# Patient Record
Sex: Female | Born: 1975 | Race: Black or African American | Hispanic: No | Marital: Single | Smoking: Never smoker
Health system: Southern US, Community
[De-identification: ages and names within clinical notes are randomized; demographics above are authoritative.]

## PROBLEM LIST (undated history)

## (undated) DIAGNOSIS — Z90710 Acquired absence of both cervix and uterus: Secondary | ICD-10-CM

## (undated) DIAGNOSIS — A048 Other specified bacterial intestinal infections: Secondary | ICD-10-CM

## (undated) DIAGNOSIS — G43909 Migraine, unspecified, not intractable, without status migrainosus: Secondary | ICD-10-CM

## (undated) DIAGNOSIS — N8300 Follicular cyst of ovary, unspecified side: Secondary | ICD-10-CM

## (undated) DIAGNOSIS — Z008 Encounter for other general examination: Secondary | ICD-10-CM

## (undated) DIAGNOSIS — J45909 Unspecified asthma, uncomplicated: Secondary | ICD-10-CM

## (undated) DIAGNOSIS — M1990 Unspecified osteoarthritis, unspecified site: Secondary | ICD-10-CM

## (undated) DIAGNOSIS — K5792 Diverticulitis of intestine, part unspecified, without perforation or abscess without bleeding: Secondary | ICD-10-CM

## (undated) DIAGNOSIS — L659 Nonscarring hair loss, unspecified: Secondary | ICD-10-CM

## (undated) HISTORY — DX: Encounter for other general examination: Z00.8

## (undated) HISTORY — DX: Acquired absence of both cervix and uterus: Z90.710

## (undated) HISTORY — DX: Unspecified asthma, uncomplicated: J45.909

## (undated) HISTORY — PX: FOOT SURGERY: SHX648

## (undated) HISTORY — PX: HERNIA REPAIR: SHX51

## (undated) HISTORY — DX: Migraine, unspecified, not intractable, without status migrainosus: G43.909

## (undated) HISTORY — PX: ECTOPIC PREGNANCY SURGERY: SHX613

## (undated) HISTORY — PX: ABDOMINAL HYSTERECTOMY: SHX81

## (undated) HISTORY — PX: OOPHORECTOMY: SHX86

## (undated) HISTORY — DX: Unspecified osteoarthritis, unspecified site: M19.90

---

## 2001-07-23 HISTORY — PX: TOTAL VAGINAL HYSTERECTOMY: SHX2548

## 2001-07-23 HISTORY — PX: PARTIAL HYSTERECTOMY: SHX80

## 2006-03-13 ENCOUNTER — Ambulatory Visit: Payer: Self-pay | Admitting: Family Medicine

## 2006-04-08 ENCOUNTER — Ambulatory Visit: Payer: Self-pay | Admitting: Family Medicine

## 2006-04-30 DIAGNOSIS — D509 Iron deficiency anemia, unspecified: Secondary | ICD-10-CM | POA: Insufficient documentation

## 2006-04-30 DIAGNOSIS — G9332 Myalgic encephalomyelitis/chronic fatigue syndrome: Secondary | ICD-10-CM | POA: Insufficient documentation

## 2006-04-30 DIAGNOSIS — R5382 Chronic fatigue, unspecified: Secondary | ICD-10-CM

## 2006-05-03 ENCOUNTER — Ambulatory Visit: Payer: Self-pay | Admitting: Physical Medicine & Rehabilitation

## 2006-05-06 ENCOUNTER — Ambulatory Visit: Payer: Self-pay | Admitting: Family Medicine

## 2006-05-06 LAB — CONVERTED CEMR LAB: Blood Glucose, Fasting: 102 mg/dL

## 2006-06-20 ENCOUNTER — Ambulatory Visit: Payer: Self-pay | Admitting: Family Medicine

## 2006-06-20 ENCOUNTER — Encounter: Payer: Self-pay | Admitting: Family Medicine

## 2006-06-20 DIAGNOSIS — J45909 Unspecified asthma, uncomplicated: Secondary | ICD-10-CM | POA: Insufficient documentation

## 2006-06-20 DIAGNOSIS — L259 Unspecified contact dermatitis, unspecified cause: Secondary | ICD-10-CM | POA: Insufficient documentation

## 2006-06-21 ENCOUNTER — Ambulatory Visit: Payer: Self-pay | Admitting: Physical Medicine & Rehabilitation

## 2006-06-24 ENCOUNTER — Telehealth: Payer: Self-pay | Admitting: Family Medicine

## 2006-09-16 ENCOUNTER — Ambulatory Visit: Payer: Self-pay | Admitting: Family Medicine

## 2006-09-16 LAB — CONVERTED CEMR LAB
Glucose, Urine, Semiquant: NEGATIVE
Ketones, urine, test strip: NEGATIVE
Protein, U semiquant: NEGATIVE

## 2006-09-23 ENCOUNTER — Telehealth: Payer: Self-pay | Admitting: Family Medicine

## 2006-09-23 ENCOUNTER — Ambulatory Visit: Payer: Self-pay | Admitting: Family Medicine

## 2006-09-23 DIAGNOSIS — R319 Hematuria, unspecified: Secondary | ICD-10-CM | POA: Insufficient documentation

## 2006-09-23 LAB — CONVERTED CEMR LAB
Bilirubin Urine: NEGATIVE
Ketones, urine, test strip: NEGATIVE
WBC Urine, dipstick: NEGATIVE
pH: 7

## 2006-09-24 ENCOUNTER — Encounter: Payer: Self-pay | Admitting: Family Medicine

## 2006-09-24 LAB — CONVERTED CEMR LAB
RBC / HPF: NONE SEEN (ref <3)
WBC, UA: NONE SEEN cells/hpf (ref <3)

## 2006-09-26 ENCOUNTER — Telehealth: Payer: Self-pay | Admitting: Family Medicine

## 2006-10-18 ENCOUNTER — Ambulatory Visit: Payer: Self-pay | Admitting: Family Medicine

## 2006-10-18 DIAGNOSIS — R109 Unspecified abdominal pain: Secondary | ICD-10-CM | POA: Insufficient documentation

## 2006-10-18 DIAGNOSIS — R928 Other abnormal and inconclusive findings on diagnostic imaging of breast: Secondary | ICD-10-CM | POA: Insufficient documentation

## 2006-10-29 ENCOUNTER — Encounter: Payer: Self-pay | Admitting: Family Medicine

## 2006-11-04 ENCOUNTER — Encounter: Payer: Self-pay | Admitting: Family Medicine

## 2007-01-15 ENCOUNTER — Encounter: Payer: Self-pay | Admitting: Family Medicine

## 2007-02-24 ENCOUNTER — Ambulatory Visit: Payer: Self-pay | Admitting: Family Medicine

## 2007-02-24 ENCOUNTER — Other Ambulatory Visit: Admission: RE | Admit: 2007-02-24 | Discharge: 2007-02-24 | Payer: Self-pay | Admitting: Family Medicine

## 2007-02-24 ENCOUNTER — Encounter: Payer: Self-pay | Admitting: Family Medicine

## 2007-02-28 ENCOUNTER — Encounter: Payer: Self-pay | Admitting: Family Medicine

## 2007-02-28 ENCOUNTER — Telehealth: Payer: Self-pay | Admitting: Family Medicine

## 2007-02-28 LAB — CONVERTED CEMR LAB
ALT: 17 units/L (ref 0–35)
AST: 14 units/L (ref 0–37)
Albumin: 4.5 g/dL (ref 3.5–5.2)
BUN: 15 mg/dL (ref 6–23)
Calcium: 9.5 mg/dL (ref 8.4–10.5)
Chloride: 107 meq/L (ref 96–112)
Cholesterol, target level: 200 mg/dL
HDL goal, serum: 40 mg/dL
HDL: 51 mg/dL (ref >39)
Hemoglobin: 13 g/dL (ref 12.0–15.0)
LDL Cholesterol: 145 mg/dL — ABNORMAL HIGH (ref 0–99)
LDL Goal: 160 mg/dL
Potassium: 4.6 meq/L (ref 3.5–5.3)
RDW: 16.8 % — ABNORMAL HIGH (ref 11.5–14.0)
Sodium: 144 meq/L (ref 135–145)
TSH: 2.18 microintl units/mL (ref 0.350–5.50)
Total Protein: 7.9 g/dL (ref 6.0–8.3)

## 2007-05-02 ENCOUNTER — Telehealth: Payer: Self-pay | Admitting: Family Medicine

## 2007-05-02 ENCOUNTER — Telehealth (INDEPENDENT_AMBULATORY_CARE_PROVIDER_SITE_OTHER): Payer: Self-pay | Admitting: *Deleted

## 2007-05-12 LAB — HM MAMMOGRAPHY

## 2007-05-13 ENCOUNTER — Ambulatory Visit: Payer: Self-pay | Admitting: Family Medicine

## 2007-05-13 DIAGNOSIS — G43009 Migraine without aura, not intractable, without status migrainosus: Secondary | ICD-10-CM | POA: Insufficient documentation

## 2007-06-16 ENCOUNTER — Ambulatory Visit: Payer: Self-pay | Admitting: Family Medicine

## 2008-03-08 ENCOUNTER — Telehealth: Payer: Self-pay | Admitting: Family Medicine

## 2008-03-11 ENCOUNTER — Ambulatory Visit: Payer: Self-pay | Admitting: Family Medicine

## 2008-03-11 DIAGNOSIS — R221 Localized swelling, mass and lump, neck: Secondary | ICD-10-CM

## 2008-03-11 DIAGNOSIS — R220 Localized swelling, mass and lump, head: Secondary | ICD-10-CM | POA: Insufficient documentation

## 2008-03-12 LAB — CONVERTED CEMR LAB
Basophils Absolute: 0 10*3/uL (ref 0.0–0.1)
Lymphocytes Relative: 52 % — ABNORMAL HIGH (ref 12–46)
Lymphs Abs: 2.8 10*3/uL (ref 0.7–4.0)
Neutrophils Relative %: 40 % — ABNORMAL LOW (ref 43–77)
Platelets: 332 10*3/uL (ref 150–400)
RDW: 16.5 % — ABNORMAL HIGH (ref 11.5–15.5)
Sed Rate: 14 mm/hr (ref 0–22)
TSH: 1.154 microintl units/mL (ref 0.350–4.50)
WBC: 5.5 10*3/uL (ref 4.0–10.5)

## 2008-07-23 HISTORY — PX: OTHER SURGICAL HISTORY: SHX169

## 2008-11-04 ENCOUNTER — Ambulatory Visit: Payer: Self-pay | Admitting: Family Medicine

## 2009-01-07 ENCOUNTER — Ambulatory Visit: Payer: Self-pay | Admitting: Family Medicine

## 2009-01-07 ENCOUNTER — Ambulatory Visit (HOSPITAL_BASED_OUTPATIENT_CLINIC_OR_DEPARTMENT_OTHER): Admission: RE | Admit: 2009-01-07 | Discharge: 2009-01-07 | Payer: Self-pay | Admitting: Family Medicine

## 2009-01-07 ENCOUNTER — Ambulatory Visit: Payer: Self-pay | Admitting: Diagnostic Radiology

## 2009-01-07 DIAGNOSIS — R10814 Left lower quadrant abdominal tenderness: Secondary | ICD-10-CM | POA: Insufficient documentation

## 2009-01-07 LAB — CONVERTED CEMR LAB
AST: 17 units/L (ref 0–37)
Albumin: 4 g/dL (ref 3.5–5.2)
Alkaline Phosphatase: 56 units/L (ref 39–117)
BUN: 6 mg/dL (ref 6–23)
Glucose, Bld: 95 mg/dL (ref 70–99)
Glucose, Urine, Semiquant: NEGATIVE
Nitrite: NEGATIVE
Potassium: 3.8 meq/L (ref 3.5–5.3)
RBC / HPF: NONE SEEN (ref <3)
Sodium: 140 meq/L (ref 135–145)
Total Bilirubin: 0.6 mg/dL (ref 0.3–1.2)
Urobilinogen, UA: 0.2

## 2009-01-08 ENCOUNTER — Encounter: Payer: Self-pay | Admitting: Family Medicine

## 2009-01-10 ENCOUNTER — Encounter: Payer: Self-pay | Admitting: Family Medicine

## 2009-01-10 LAB — CONVERTED CEMR LAB
Amylase: 36 units/L (ref 0–105)
Basophils Absolute: 0 10*3/uL (ref 0.0–0.1)
Basophils Relative: 0 % (ref 0–1)
Eosinophils Absolute: 0.1 10*3/uL (ref 0.0–0.7)
Eosinophils Relative: 1 % (ref 0–5)
HCT: 38.3 % (ref 36.0–46.0)
Hemoglobin: 12.6 g/dL (ref 12.0–15.0)
Lipase: 76 units/L — ABNORMAL HIGH (ref 0–75)
Lymphocytes Relative: 42 % (ref 12–46)
Lymphs Abs: 2.8 10*3/uL (ref 0.7–4.0)
MCHC: 32.9 g/dL (ref 30.0–36.0)
MCV: 74.2 fL — ABNORMAL LOW (ref 78.0–100.0)
Monocytes Absolute: 0.6 10*3/uL (ref 0.1–1.0)
Monocytes Relative: 9 % (ref 3–12)
Neutro Abs: 3.2 10*3/uL (ref 1.7–7.7)
Neutrophils Relative %: 48 % (ref 43–77)
Platelets: 271 10*3/uL (ref 150–400)
RBC: 5.16 M/uL — ABNORMAL HIGH (ref 3.87–5.11)
RDW: 17.2 % — ABNORMAL HIGH (ref 11.5–15.5)
Saturation Ratios: 16 % — ABNORMAL LOW (ref 20–55)
WBC: 6.7 10*3/uL (ref 4.0–10.5)

## 2009-02-07 ENCOUNTER — Telehealth: Payer: Self-pay | Admitting: Family Medicine

## 2009-02-07 DIAGNOSIS — N83209 Unspecified ovarian cyst, unspecified side: Secondary | ICD-10-CM | POA: Insufficient documentation

## 2009-02-08 ENCOUNTER — Encounter: Admission: RE | Admit: 2009-02-08 | Discharge: 2009-02-08 | Payer: Self-pay | Admitting: Family Medicine

## 2009-02-24 ENCOUNTER — Ambulatory Visit: Payer: Self-pay | Admitting: Family Medicine

## 2009-02-24 DIAGNOSIS — M546 Pain in thoracic spine: Secondary | ICD-10-CM | POA: Insufficient documentation

## 2009-02-24 LAB — CONVERTED CEMR LAB
Bilirubin Urine: NEGATIVE
Glucose, Urine, Semiquant: NEGATIVE
Nitrite: NEGATIVE
WBC Urine, dipstick: NEGATIVE

## 2009-02-25 ENCOUNTER — Encounter: Payer: Self-pay | Admitting: Family Medicine

## 2009-08-12 ENCOUNTER — Ambulatory Visit: Payer: Self-pay | Admitting: Family Medicine

## 2009-08-12 DIAGNOSIS — J069 Acute upper respiratory infection, unspecified: Secondary | ICD-10-CM | POA: Insufficient documentation

## 2009-08-15 ENCOUNTER — Telehealth: Payer: Self-pay | Admitting: Family Medicine

## 2010-08-22 NOTE — Progress Notes (Signed)
Summary: Not feeling any better  Phone Note Call from Patient Call back at Home Phone [phone]   Caller: Patient Call For: Nani Gasser MD Summary of Call: pt calls and states that the cough is not any better- its worse and chest congestion worse and was told to call if no better Initial call taken by: Kathlene November,  August 15, 2009 2:36 PM  Follow-up for Phone Call        [facility] will call in ABX Follow-up by: Nani Gasser MD,  August 15, 2009 3:54 PM    New/Updated Medications: ZITHROMAX Z-PAK 250 MG TABS (AZITHROMYCIN) Take as directed. Prescriptions: ZITHROMAX Z-PAK 250 MG TABS (AZITHROMYCIN) Take as directed.  #1 pack x 0   Entered and Authorized by:   Nani Gasser MD   Signed by:   Nani Gasser MD on 08/15/2009   Method used:   Electronically to        [facility] # [phone]* (retail)       [address] [place], [place]  [address]       Ph: [phone]       Fax: [phone]   RxID:   [id number]

## 2010-08-22 NOTE — Assessment & Plan Note (Signed)
Summary: Meredith Barnes, HSV results.    Vital Signs:  Patient profile:   35 year old female Height:      66 inches Weight:      235 pounds BMI:     38.07 Pulse rate:   78 / minute BP sitting:   114 / 77  (left arm) Cuff size:   large  Vitals Entered By: Kathlene November (August 12, 2009 1:47 PM) CC: would like second opinion on herpes diagnosis that she was given by another MD   Primary Care Provider:  Linford Arnold  CC:  would like second opinion on herpes diagnosis that she was given by another MD.  History of Present Illness: would like second opinion on herpes diagnosis that she was given by another MD. Had a pap and CPE about 2-3 weeks ago. Had a d/c at that time.  Was dx with trichomonas. Then had STD testing. Labwork showed herpes, not sure if acute or not.  She brought in a copy with her and would like me to review it. Had recheck for her trichomonas yesterday to make sure if cleared.     Cough for 2 weeks. Some better but still worse at night. No fever or ST.  Tooks some delsym.  no hx of asthma.  No GERD sxs. the cold and heat seem to aggrevate her sxs.    Current Medications (verified): 1)  None  Allergies (verified): No Known Drug Allergies  Comments:  Nurse/Medical Assistant: The patient's medications and allergies were reviewed with the patient and were updated in the Medication and Allergy Lists. Kathlene November (August 12, 2009 1:48 PM)  Physical Exam  General:  Well-developed,well-nourished,in no acute distress; alert,appropriate and cooperative throughout examination Head:  Normocephalic and atraumatic without obvious abnormalities. No apparent alopecia or balding. Eyes:  No corneal or conjunctival inflammation noted. EOMI. Perrla. Ears:  External ear exam shows no significant lesions or deformities.  Otoscopic examination reveals clear canals, tympanic membranes are intact bilaterally without bulging, retraction, inflammation or discharge. Hearing is grossly normal  bilaterally. Nose:  External nasal examination shows no deformity or inflammation. Nasal mucosa are pink and moist without lesions or exudates. Mouth:  Oral mucosa and oropharynx without lesions or exudates.  Teeth in good repair. Lungs:  Normal respiratory effort, chest expands symmetrically. Lungs are clear to auscultation, no crackles or wheezes. Heart:  Normal rate and regular rhythm. S1 and S2 normal without gallop, murmur, click, rub or other extra sounds. Skin:  no rashes.   Cervical Nodes:  No lymphadenopathy noted Psych:  Cognition and judgment appear intact. Alert and cooperative with normal attention span and concentration. No apparent delusions, illusions, hallucinations   Impression & Recommendations:  Problem # 1:  URI (ICD-465.9) Assessment New  The following medications were removed from the medication list:    Ibuprofen 600 Mg Tabs (Ibuprofen) .Marland Kitchen... Take 1 tablet by mouth three times a day as needed  Instructed on symptomatic treatment. Call if symptoms persist or worsen.   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 She was + for IgD antibodies. Expalined that this jut means that she has been exposed. She has not seen any active lesions. We spent about 20 minues face to face discussing the dx.

## 2010-11-06 ENCOUNTER — Encounter: Payer: Self-pay | Admitting: Family Medicine

## 2010-11-06 ENCOUNTER — Telehealth: Payer: Self-pay | Admitting: *Deleted

## 2010-11-06 ENCOUNTER — Ambulatory Visit (INDEPENDENT_AMBULATORY_CARE_PROVIDER_SITE_OTHER): Payer: PRIVATE HEALTH INSURANCE | Admitting: Family Medicine

## 2010-11-06 VITALS — BP 124/83 | HR 93 | Ht 67.0 in | Wt 224.0 lb

## 2010-11-06 DIAGNOSIS — G43509 Persistent migraine aura without cerebral infarction, not intractable, without status migrainosus: Secondary | ICD-10-CM

## 2010-11-06 DIAGNOSIS — G43009 Migraine without aura, not intractable, without status migrainosus: Secondary | ICD-10-CM

## 2010-11-06 MED ORDER — TOPIRAMATE 25 MG PO TABS: ORAL_TABLET | ORAL | Status: DC

## 2010-11-06 MED ORDER — PREDNISONE (PAK) 10 MG PO TABS: ORAL_TABLET | ORAL | Status: DC

## 2010-11-06 NOTE — Assessment & Plan Note (Signed)
At this point I really think she is having rebound headaches. I think this is why they are persistent the last month she is continually taking medication on a daily basis. Even though she has tried steroids a couple weeks ago I would like to retry this and have her stop all additional medications including the Fioricet, Advil, hydrocodone, muscle relaxers. She reports them as relaxers don't help her anyway. I also would like to go ahead and start her on prophylaxis so we discussed different options. She has taken amitriptyline in the past and felt it didn't work well. We will start with Topamax. She is unsure where she's taken this in the past or not. Followup one month. If headache is not breaking over the next 10 days she's to call the office. In the meantime we will schedule her for an MRI since this migraine is very unusual in that it has lasted this long. Typically at the most her migraines last 3 days.

## 2010-11-06 NOTE — Progress Notes (Signed)
  Subjective:    Patient ID: Meredith Barnes, female    DOB: October 20, 1975, 35 y.o.   MRN: [id number]  Migraine  This is a chronic problem. The current episode started 1 to 4 weeks ago. The problem occurs daily. The problem has been unchanged. The pain is located in the left unilateral region. The pain radiates to the left neck and left shoulder. The pain quality is similar to prior headaches. The quality of the pain is described as throbbing. The pain is at a severity of 8/10. Associated symptoms include neck pain, phonophobia and photophobia. Pertinent negatives include no coughing, loss of balance, nausea, numbness, sinus pressure, sore throat or visual change. The symptoms are aggravated by noise and bright light. She has tried darkened room, NSAIDs and ketorolac injections for the symptoms. Her past medical history is significant for migraine headaches and obesity.    Usaing a lot of Advil migraine. Did go to the ED about 3 weeks ago and scanned her head. Given steroid pack and didn't help. Went again yesterday today and give toradol  And decan shot and given some med for nausea. Given a rx for Fiorcet.  HA worse worse when she woke up in the middle of the night..  Pain is moslty on her right side and getting tightening in her shoulder and esp on the left side of neck. Went back to ED this morning ([facility]) and given more rx that she hasn't filled yet. One is a muscle relaxer which she feels does not really help her and one is for a narcotic hydrocodone for pain relief. Pain is 8/10 right now. No injections this AM.  No nausea. No changes in her diet or sleep patterns or mood.  Now her upper back as well.  Right side is not bothering her. Tried a heating pad- no relief.  Head is throbbing.  No URI sxs or sinus pressure.    Review of Systems  HENT: Positive for neck pain. Negative for sore throat and sinus pressure.   Eyes: Positive for photophobia.  Respiratory: Negative for cough.     Gastrointestinal: Negative for nausea.  Neurological: Negative for numbness and loss of balance.       Objective:   Physical Exam  Constitutional: She is oriented to person, place, and time. She appears well-developed and well-nourished.  HENT:  Head: Normocephalic and atraumatic.  Right Ear: External ear normal.  Left Ear: External ear normal.  Nose: Nose normal.  Mouth/Throat: Oropharynx is clear and moist.       She is squinting.  Eyes: Conjunctivae and EOM are normal. Pupils are equal, round, and reactive to light.  Neck: Normal range of motion. Neck supple. No thyromegaly present.  Cardiovascular: Normal rate, regular rhythm and normal heart sounds.   Pulmonary/Chest: Effort normal and breath sounds normal.  Lymphadenopathy:    She has no cervical adenopathy.  Neurological: She is alert and oriented to person, place, and time. No cranial nerve deficit.       Normal gait.  Skin: Skin is warm and dry.  Psychiatric: She has a normal mood and affect.          Assessment & Plan:

## 2010-11-06 NOTE — Telephone Encounter (Signed)
[facility] Triage Call Report Triage Record Num: [id number] Operator: Migdalia Dk Patient Name: Mattia [place]issau Call Date & Time: 11/06/2010 3:58:50AM Patient Phone: [phone] PCP: Nani Gasser Patient Gender: Female PCP Fax : [phone] Patient DOB: 12-07-1975 [facility] Name[facility] Reason for Call: Pt. states seen in ER due to headache on 10/24/2010, seen again on 11/04/2010 and again this a.m. "I went back yesterday morning because I was hurting again and they gave me shots and a prescription for headaches (Fiorcet), I took those and they didn't help , so I went back and they gave me two more prescriptions and sent me back home." States now has scripts for Flexeril and Percocet on hand but has not had them filled. Pain in left side of head to neck and down to shoulder, "it tightens up in my arm and goes numb down to my hand." Pt. advised to go back to ER or to fill scripts that she received to see if they will help with pain. States, "If i go back to the ER they are just going to send me home again and they said they won't have anyone there to do an MRI till 9 this morning, they told me to call my doctor and see if he couldn't set that up." Pt. states she will call office this a.m. for follow up.

## 2010-11-08 ENCOUNTER — Telehealth: Payer: Self-pay | Admitting: *Deleted

## 2010-11-08 MED ORDER — HYDROCODONE-ACETAMINOPHEN 5-325 MG PO TABS: 1.0000 | ORAL_TABLET | Freq: Four times a day (QID) | ORAL | Status: DC | PRN

## 2010-11-08 NOTE — Telephone Encounter (Signed)
Pt states she does need Rx for hydrocodone.

## 2010-11-08 NOTE — Telephone Encounter (Signed)
Addended by: Nani Gasser on: 11/08/2010 11:58 AM   Modules accepted: Orders

## 2010-11-08 NOTE — Telephone Encounter (Signed)
Pt. Called a little upset and states that she had left a message yesterday on the triage line and on the nurse's line and did not get a phone call back. She is calling because she is in pain and has a question about her meds and when her MRI will be completed. PLEASE call her and update her on the status of all this. Thanks, [employer]

## 2010-11-08 NOTE — Telephone Encounter (Signed)
She can use the hydrocodone then. If needs a new rx then let me know. We are working on the MRI but it is not emergent. I just say her yesterday.

## 2010-11-08 NOTE — Telephone Encounter (Signed)
Pt wants rx for hydrocodone. 

## 2010-11-08 NOTE — Telephone Encounter (Signed)
[facility] Triage Call Report Triage Record Num: [id number] Operator: Martie Lee Long Patient Name: Meredith Barnes Call Date & Time: 11/07/2010 5:34:50PM Patient Phone: [phone] PCP: Nani Gasser Patient Gender: Female PCP Fax : [phone] Patient DOB: 1975-09-08 Practice Name[facility] Reason for Call: [name]/Patient calling about lower back pain and tightness to her arms and neck(all on the left side). Pt has been evaluated for this on 11/06/10. Pt was seen in office on 11/06/10 and started on Topromax for her headaches. Onset 11/06/10. Pt wants to know what to take. All emergent sx r/o per Back Symptoms Protocol. Homecare advice given.

## 2010-11-16 ENCOUNTER — Ambulatory Visit
Admission: RE | Admit: 2010-11-16 | Discharge: 2010-11-16 | Disposition: A | Discharge status: Home / Self Care | Payer: PRIVATE HEALTH INSURANCE | Source: Ambulatory Visit | Attending: Family Medicine | Admitting: Family Medicine

## 2010-11-16 DIAGNOSIS — G43509 Persistent migraine aura without cerebral infarction, not intractable, without status migrainosus: Secondary | ICD-10-CM

## 2010-11-20 ENCOUNTER — Telehealth: Payer: Self-pay | Admitting: Family Medicine

## 2010-11-20 NOTE — Telephone Encounter (Signed)
A few small spots on the white matter that is consistant with migraines. No sign of tumor, etc.  Is she still having HA?

## 2010-11-21 NOTE — Telephone Encounter (Signed)
Left message on pt's vm with results

## 2010-12-03 ENCOUNTER — Encounter: Payer: Self-pay | Admitting: Family Medicine

## 2010-12-07 ENCOUNTER — Ambulatory Visit: Payer: PRIVATE HEALTH INSURANCE | Admitting: Family Medicine

## 2011-06-29 ENCOUNTER — Encounter (HOSPITAL_BASED_OUTPATIENT_CLINIC_OR_DEPARTMENT_OTHER): Payer: Self-pay | Admitting: Emergency Medicine

## 2011-06-29 ENCOUNTER — Emergency Department (HOSPITAL_BASED_OUTPATIENT_CLINIC_OR_DEPARTMENT_OTHER)
Admission: EM | Admit: 2011-06-29 | Discharge: 2011-06-29 | Disposition: A | Discharge status: Home / Self Care | Payer: Medicaid Other | Attending: Emergency Medicine | Admitting: Emergency Medicine

## 2011-06-29 DIAGNOSIS — R059 Cough, unspecified: Secondary | ICD-10-CM | POA: Insufficient documentation

## 2011-06-29 DIAGNOSIS — J209 Acute bronchitis, unspecified: Secondary | ICD-10-CM | POA: Insufficient documentation

## 2011-06-29 DIAGNOSIS — R05 Cough: Secondary | ICD-10-CM | POA: Insufficient documentation

## 2011-06-29 MED ORDER — AZITHROMYCIN 250 MG PO TABS: ORAL_TABLET | ORAL | Status: DC

## 2011-06-29 MED ORDER — HYDROCOD POLST-CHLORPHEN POLST 10-8 MG/5ML PO LQCR: 5.0000 mL | Freq: Two times a day (BID) | ORAL | Status: DC | PRN

## 2011-06-29 NOTE — ED Notes (Signed)
Care assumed

## 2011-06-29 NOTE — ED Notes (Signed)
Pt states chest pain is tactile above sternum, sore to touch, denies SOB or cardiac symptoms. Pt states pain to muscles of chest "every time I cough". Skin tone WNL. No resp distress.

## 2011-06-29 NOTE — ED Provider Notes (Addendum)
History     CSN: [id number] Arrival date & time: 06/29/2011  9:24 AM   None     Chief Complaint  Patient presents with  . Cough    (Consider location/radiation/quality/duration/timing/severity/associated sxs/prior treatment) HPI Comments:  Patient is a 35 year old woman who has had a bad cough for about a week. The cough keeps her up at night. She coughs up a yellow sputum. There's been no fever. She therefore seeks evaluation. She is a nonsmoker.  Patient is a 35 y.o. female presenting with cough.  Cough The current episode started more than 2 days ago. The problem occurs constantly. The cough is productive of sputum. There has been no fever. She has tried nothing for the symptoms. She is not a smoker.    Past Medical History  Diagnosis Date  . Ectopic pregnancy   . Hx of hysterectomy partial for cervicl CA 2003  . Sigmoidoscopy exam partial sigmoid colectomy 07-23-08    Past Surgical History  Procedure Date  . Partial hysterectomy 2003    Cervical Cancer  . Ectopic pregnancy surgery   . Partial sigmoid colectomy 07-23-08 07-23-08    History reviewed. No pertinent family history.  History  Substance Use Topics  . Smoking status: Never Smoker   . Smokeless tobacco: Not on file  . Alcohol Use: No    OB History    Grav Para Term Preterm Abortions TAB SAB Ect Mult Living                  Review of Systems  Constitutional: Negative.  Negative for fever.  HENT: Negative.   Eyes: Negative.   Respiratory: Positive for cough.   Cardiovascular: Negative.   Gastrointestinal: Negative.   Genitourinary: Negative.   Musculoskeletal: Negative.   Skin: Negative.   Neurological: Negative.   Psychiatric/Behavioral: Negative.     Allergies  Review of patient's allergies indicates no known allergies.  Home Medications   Current Outpatient Rx  Name Route Sig Dispense Refill  . AZITHROMYCIN 250 MG PO TABS Oral Take 2 tablets by mouth on day 1, followed by 1 tablet by  mouth daily for 4 days. Take as directed.     . AZITHROMYCIN 250 MG PO TABS  Take 2 tablets today, then 1 every day until finished. 6 tablet 0  . HYDROCOD POLST-CHLORPHEN POLST 10-8 MG/5ML PO LQCR Oral Take 5 mLs by mouth every 12 (twelve) hours as needed. 60 mL 0  . HYDROCODONE-ACETAMINOPHEN 5-325 MG PO TABS Oral Take 1 tablet by mouth every 6 (six) hours as needed. 12 tablet 0  . PREDNISONE (PAK) 10 MG PO TABS  8 tabs by mouth Day 1, 6 tabs by mouth Day 2, 4 tabs by mouth Day 3, 2 tabs by mouth Day 4, 1 tab by mouth day 5. 21 tablet 0  . TOPIRAMATE 25 MG PO TABS  One tab at bedtime for one week.  Then increase to bid. 60 tablet 0    BP 119/84  Pulse 79  Temp(Src) 98.3 F (36.8 C) (Oral)  Resp 18  Ht 5\' 7"  (1.702 m)  Wt 234 lb (106.142 kg)  BMI 36.65 kg/m2  SpO2 100%  Physical Exam  Constitutional: She is oriented to person, place, and time.       Patient is an obese young woman with a deep hacking  cough.  HENT:  Head: Normocephalic and atraumatic.  Right Ear: External ear normal.  Left Ear: External ear normal.  Mouth/Throat: Oropharynx is clear and moist.  Eyes: Conjunctivae and EOM are normal. Pupils are equal, round, and reactive to light.  Neck: Normal range of motion. Neck supple.  Cardiovascular: Normal rate, regular rhythm and normal heart sounds.   Pulmonary/Chest: Effort normal and breath sounds normal. She has no wheezes. She has no rales.  Abdominal: Soft. Bowel sounds are normal.  Musculoskeletal: Normal range of motion.  Lymphadenopathy:    She has no cervical adenopathy.  Neurological: She is alert and oriented to person, place, and time.       No sensory or motor deficit.  Skin: Skin is warm and dry.  Psychiatric: She has a normal mood and affect. Her behavior is normal.    ED Course  Procedures (including critical care time)  9:57 AM Rx for bronchitis with Z-pak, Tussionex.   1. Acute bronchitis          Carleene Cooper III, MD 06/29/11  [id number]  Carleene Cooper III, MD 06/29/11 2125

## 2011-07-22 ENCOUNTER — Emergency Department (HOSPITAL_BASED_OUTPATIENT_CLINIC_OR_DEPARTMENT_OTHER)
Admission: EM | Admit: 2011-07-22 | Discharge: 2011-07-23 | Disposition: A | Discharge status: Home / Self Care | Payer: Medicaid Other | Attending: Emergency Medicine | Admitting: Emergency Medicine

## 2011-07-22 ENCOUNTER — Emergency Department (INDEPENDENT_AMBULATORY_CARE_PROVIDER_SITE_OTHER): Payer: Medicaid Other

## 2011-07-22 ENCOUNTER — Encounter (HOSPITAL_BASED_OUTPATIENT_CLINIC_OR_DEPARTMENT_OTHER): Payer: Self-pay | Admitting: *Deleted

## 2011-07-22 DIAGNOSIS — R109 Unspecified abdominal pain: Secondary | ICD-10-CM | POA: Insufficient documentation

## 2011-07-22 DIAGNOSIS — R1900 Intra-abdominal and pelvic swelling, mass and lump, unspecified site: Secondary | ICD-10-CM

## 2011-07-22 DIAGNOSIS — M545 Low back pain, unspecified: Secondary | ICD-10-CM | POA: Insufficient documentation

## 2011-07-22 LAB — URINALYSIS, ROUTINE W REFLEX MICROSCOPIC
Bilirubin Urine: NEGATIVE
Glucose, UA: NEGATIVE mg/dL
Ketones, ur: NEGATIVE mg/dL
Leukocytes, UA: NEGATIVE
Protein, ur: NEGATIVE mg/dL

## 2011-07-22 LAB — URINE MICROSCOPIC-ADD ON

## 2011-07-22 MED ORDER — IBUPROFEN 400 MG PO TABS
600.0000 mg | ORAL_TABLET | Freq: Once | ORAL | Status: AC
  Administered 2011-07-23: 600 mg via ORAL
  Filled 2011-07-22: qty 1

## 2011-07-22 NOTE — ED Notes (Signed)
Pt states that she had her right ovary removed in October but they were unable to remove left ovary pt states that at that time she had a cyst on her left ovary presents left lower quad pain similar to previous episodes denies N/V abnormal vaginal bleeding or vaginal DC

## 2011-07-22 NOTE — ED Notes (Signed)
Patient transported to CT 

## 2011-07-22 NOTE — ED Provider Notes (Signed)
History    This chart was scribed for Lyanne Co, MD, MD by Smitty Pluck. The patient was seen in room MH06 and the patient's care was started at 11:38PM.   CSN: [id number]  Arrival date & time 07/22/11  2110   First MD Initiated Contact with Patient 07/22/11 2304      Chief Complaint  Patient presents with  . Abdominal Pain     The history is provided by the patient.   Meredith Barnes is a 35 y.o. female who presents to the Emergency Department complaining of moderate left side abdominal pain radiating to lower back onset 2 days ago. Pt says she has had the pain before when she had her right ovary removed. She reports the pain was at a 6/10 pta. She reports there was a cyst on the left ovary so the surgeon could not remove that ovary. Pt reports the pain is waxing and waning/ Pt denies dysuria, vaginal discharge, constipation, diarrhea and vaginal bleeding. Pt has not taken any medications for the pain. OBGYN is Dr. Jacob Moores  Past Medical History  Diagnosis Date  . Ectopic pregnancy   . Hx of hysterectomy partial for cervicl CA 2003  . Sigmoidoscopy exam partial sigmoid colectomy 07-23-08    Past Surgical History  Procedure Date  . Partial hysterectomy 2003    Cervical Cancer  . Ectopic pregnancy surgery   . Partial sigmoid colectomy 07-23-08 07-23-08  . Oophorectomy     History reviewed. No pertinent family history.  History  Substance Use Topics  . Smoking status: Never Smoker   . Smokeless tobacco: Not on file  . Alcohol Use: No    OB History    Grav Para Term Preterm Abortions TAB SAB Ect Mult Living                  Review of Systems  All other systems reviewed and are negative.   10 Systems reviewed and are negative for acute change except as noted in the HPI.  Allergies  Ivp dye  Home Medications  No current outpatient prescriptions on file.  BP 134/84  Pulse 78  Temp(Src) 98.2 F (36.8 C) (Oral)  Resp 16  SpO2 100%  Physical Exam  Nursing  note and vitals reviewed. Constitutional: She is oriented to person, place, and time. She appears well-developed and well-nourished. No distress.  HENT:  Head: Normocephalic and atraumatic.  Eyes: EOM are normal. Pupils are equal, round, and reactive to light.  Neck: Normal range of motion. Neck supple. No tracheal deviation present.  Cardiovascular: Normal rate, regular rhythm and normal heart sounds.   Pulmonary/Chest: Effort normal. No respiratory distress.  Abdominal: Soft. She exhibits no distension. There is Tenderness: left suprapubic area and lower abdomen .  Musculoskeletal: Normal range of motion.       No CVA tenderness  Nl appearance of back  Neurological: She is alert and oriented to person, place, and time.  Skin: Skin is warm and dry.  Psychiatric: She has a normal mood and affect. Her behavior is normal.    ED Course  Procedures (including critical care time)  DIAGNOSTIC STUDIES: Oxygen Saturation is 100% on room air, normal by my interpretation.    COORDINATION OF CARE:    Labs Reviewed  URINALYSIS, ROUTINE W REFLEX MICROSCOPIC - Abnormal; Notable for the following:    APPearance CLOUDY (*)    Hgb urine dipstick TRACE (*)    All other components within normal limits  URINE MICROSCOPIC-ADD  ON - Abnormal; Notable for the following:    Squamous Epithelial / LPF MANY (*)    Bacteria, UA MANY (*)    All other components within normal limits   Ct Abdomen Pelvis Wo Contrast  07/23/2011  *RADIOLOGY REPORT*  Clinical Data: Left flank pain and abdominal pain.  Question of ureteral stone.  Symptoms for 2 days.  History of right oophorectomy.  CT ABDOMEN AND PELVIS WITHOUT CONTRAST  Technique:  Multidetector CT imaging of the abdomen and pelvis was performed following the standard protocol without intravenous contrast.  Comparison: CT of the abdomen pelvis 01/07/2009  Findings: Images of the lung bases are unremarkable.  No focal abnormality identified within the liver,  spleen, pancreas, adrenal glands, or kidneys.  Gallbladder is present.  Small supra umbilical hernia is identified, containing only mesenteric fat.  A second small infra umbilical hernia is identified.  These are best demonstrated on sagittal image number 59/118.  Within the left lower quadrant, there is a rounded mass measuring 6.1 x 6.7 cm.  This measures low attenuation (14 HU) and may represent a cystic mass.  This mass displaces the bladder to the right and inferiorly.  The uterus is surgically absent.  Mass is favored to be left ovarian in origin.  Further evaluation with ultrasound the pelvis is recommended. The right ovary is surgically absent by history.  The stomach and small bowel loops have a normal appearance. Appendix is well seen and has a normal appearance.  There are scattered colonic diverticula.  No evidence for acute diverticulitis.  The sigmoid colon is contiguous with the pelvic mass but is not felt to be directly related to the mass.1  IMPRESSION:  1.  Low density rounded left pelvic mass, favored to be ovarian in origin.  Mass measures 6.7 cm maximum diameter.  The mass is contiguous with the sigmoid colon but is felt to be discrete from the colon. Mass is slightly larger when compared with prior study. Follow-up pelvic ultrasound is recommended to compare with previous exam. 2.  Status post hysterectomy. 3.  Small midline hernias.  See above. 4.  No evidence for renal or ureteral calculi.  The findings were discussed with Dr. Patria Mane on 07/23/2011 at 12:33 a.m.  Original Report Authenticated By: Patterson Hammersmith, M.D.   I personally reviewed his CT scan and discussed the findings with radiology  1. Abdominal pain   2. Pelvic mass       MDM  Her pain is likely ovarian cystic in nature.  Her CT scan shows a left adnexal mass which is likely her 7 cm left ovarian cyst however given its increase in size as compared to an ultrasound from 2010 the patient will require close follow up  with her OB/GYN and a repeat pelvic ultrasound to define this.  The patient understands this and will do close followup.  Home with pain medications.      I personally performed the services described in this documentation, which was scribed in my presence. The recorded information has been reviewed and considered.   Lyanne Co, MD 07/23/11 [phone]

## 2011-07-22 NOTE — ED Notes (Signed)
MD at bedside. 

## 2011-07-23 MED ORDER — HYDROCODONE-ACETAMINOPHEN 5-500 MG PO TABS: 1.0000 | ORAL_TABLET | Freq: Four times a day (QID) | ORAL | Status: AC | PRN

## 2011-07-23 MED ORDER — IBUPROFEN 600 MG PO TABS: 600.0000 mg | ORAL_TABLET | Freq: Three times a day (TID) | ORAL | Status: AC | PRN

## 2011-11-09 ENCOUNTER — Emergency Department (HOSPITAL_BASED_OUTPATIENT_CLINIC_OR_DEPARTMENT_OTHER)
Admission: EM | Admit: 2011-11-09 | Discharge: 2011-11-09 | Disposition: A | Discharge status: Home / Self Care | Payer: Self-pay | Attending: Emergency Medicine | Admitting: Emergency Medicine

## 2011-11-09 ENCOUNTER — Encounter (HOSPITAL_BASED_OUTPATIENT_CLINIC_OR_DEPARTMENT_OTHER): Payer: Self-pay | Admitting: *Deleted

## 2011-11-09 DIAGNOSIS — L02219 Cutaneous abscess of trunk, unspecified: Secondary | ICD-10-CM | POA: Insufficient documentation

## 2011-11-09 DIAGNOSIS — W57XXXA Bitten or stung by nonvenomous insect and other nonvenomous arthropods, initial encounter: Secondary | ICD-10-CM

## 2011-11-09 MED ORDER — MUPIROCIN CALCIUM 2 % EX CREA
TOPICAL_CREAM | CUTANEOUS | Status: AC
  Filled 2011-11-09: qty 15

## 2011-11-09 MED ORDER — MUPIROCIN CALCIUM 2 % EX CREA: TOPICAL_CREAM | Freq: Two times a day (BID) | CUTANEOUS | Status: DC

## 2011-11-09 NOTE — ED Notes (Signed)
Pt c/o ? Insect bite to mid back x 2 days

## 2011-11-09 NOTE — Discharge Instructions (Signed)
Insect Bite Mosquitoes, flies, fleas, bedbugs, and many other insects can bite. Insect bites are different from insect stings. A sting is when venom is injected into the skin. Some insect bites can transmit infectious diseases. SYMPTOMS  Insect bites usually turn red, swell, and itch for 2 to 4 days. They often go away on their own. TREATMENT  Your caregiver may prescribe antibiotic medicines if a bacterial infection develops in the bite. HOME CARE INSTRUCTIONS  Do not scratch the bite area.   Leave the ice on for 20 minutes, 4 times a day for the first 2 to 3 days, or as directed.   If you are given antibiotics, take them as directed. Finish them even if you start to feel better.  You may need a tetanus shot if:  You cannot remember when you had your last tetanus shot.   You have never had a tetanus shot.   The injury broke your skin.  If you get a tetanus shot, your arm may swell, get red, and feel warm to the touch. This is common and not a problem. If you need a tetanus shot and you choose not to have one, there is a rare chance of getting tetanus. Sickness from tetanus can be serious. SEEK IMMEDIATE MEDICAL CARE IF:   You have increased pain, redness, or swelling in the bite area.   You see a red line on the skin coming from the bite.   You have a fever.   You have joint pain.   You have a headache or neck pain.   You have unusual weakness.   You have a rash.   You have chest pain or shortness of breath.   You have abdominal pain, nausea, or vomiting.   You feel unusually tired or sleepy.  MAKE SURE YOU:   Understand these instructions.   Will watch your condition.   Will get help right away if you are not doing well or get worse.  Document Released: 08/16/2004 Document Revised: 06/28/2011 Document Reviewed: 02/07/2011 [facility] Patient Information 2012 [place], [place].

## 2011-11-09 NOTE — ED Provider Notes (Signed)
History     CSN: [id number]  Arrival date & time 11/09/11  0045   First MD Initiated Contact with Patient 11/09/11 [phone]      Chief Complaint  Patient presents with  . Insect Bite    (Consider location/radiation/quality/duration/timing/severity/associated sxs/prior treatment) HPI This is a 36 year old black female who states she believes she was bitten on the middle of the back by an insect 2 days ago. She subsequently had pain and swelling at the site. She states her friend squeezed it and got some stringy white material from wound. There is moderate pain associated with it, worse with palpation.  Past Medical History  Diagnosis Date  . Ectopic pregnancy   . Hx of hysterectomy partial for cervicl CA 2003  . Sigmoidoscopy exam partial sigmoid colectomy 07-23-08    Past Surgical History  Procedure Date  . Partial hysterectomy 2003    Cervical Cancer  . Ectopic pregnancy surgery   . Partial sigmoid colectomy 07-23-08 07-23-08  . Oophorectomy   . Abdominal hysterectomy     History reviewed. No pertinent family history.  History  Substance Use Topics  . Smoking status: Never Smoker   . Smokeless tobacco: Not on file  . Alcohol Use: No    OB History    Grav Para Term Preterm Abortions TAB SAB Ect Mult Living                  Review of Systems  All other systems reviewed and are negative.    Allergies  Ivp dye  Home Medications  No current outpatient prescriptions on file.  BP 128/77  Pulse 80  Temp(Src) 98.6 F (37 C) (Oral)  Resp 18  Ht 5\' 7"  (1.702 m)  Wt 255 lb (115.667 kg)  BMI 39.94 kg/m2  SpO2 100%  Physical Exam General: Well-developed, well-nourished female in no acute distress; appearance consistent with age of record HENT: normocephalic, atraumatic Eyes: Normal per Neck: supple Heart: regular rate and rhythm Lungs: Normal respiratory effort and excursion Abdomen: soft; nondistended Extremities: No deformity; full range of  motion Neurologic: Awake, alert and oriented; motor function intact in all extremities and symmetric; no facial droop Skin: Warm and dry; small punctum and central back with surrounding swelling and erythema, mildly tender Psychiatric: Normal mood and affect    ED Course  Procedures (including critical care time)  INCISION AND DRAINAGE Performed by: Paula Libra L Consent: Verbal consent obtained. Risks and benefits: risks, benefits and alternatives were discussed Type: abscess   Body area: Mid upper back  Anesthesia: local infiltration  Local anesthetic: lidocaine 2% without epinephrine  Anesthetic total: 0.5 ml  Complexity: Simple   Drainage: Sanguinous   Drainage amount: Skin   Packing material: None   Patient tolerance: Patient tolerated the procedure well with no immediate complications.      MDM          Hanley Seamen, MD 11/09/11 [phone]

## 2011-11-09 NOTE — ED Notes (Signed)
Pt has a small raised area to center of back.  Pt reports pain to site.

## 2011-12-11 ENCOUNTER — Encounter (HOSPITAL_BASED_OUTPATIENT_CLINIC_OR_DEPARTMENT_OTHER): Payer: Self-pay | Admitting: Emergency Medicine

## 2011-12-11 ENCOUNTER — Emergency Department (HOSPITAL_BASED_OUTPATIENT_CLINIC_OR_DEPARTMENT_OTHER)
Admission: EM | Admit: 2011-12-11 | Discharge: 2011-12-11 | Disposition: A | Discharge status: Home / Self Care | Payer: Self-pay | Attending: Emergency Medicine | Admitting: Emergency Medicine

## 2011-12-11 DIAGNOSIS — R110 Nausea: Secondary | ICD-10-CM | POA: Insufficient documentation

## 2011-12-11 DIAGNOSIS — M542 Cervicalgia: Secondary | ICD-10-CM | POA: Insufficient documentation

## 2011-12-11 DIAGNOSIS — R51 Headache: Secondary | ICD-10-CM | POA: Insufficient documentation

## 2011-12-11 HISTORY — DX: Migraine, unspecified, not intractable, without status migrainosus: G43.909

## 2011-12-11 MED ORDER — KETOROLAC TROMETHAMINE 60 MG/2ML IM SOLN
60.0000 mg | Freq: Once | INTRAMUSCULAR | Status: AC
  Administered 2011-12-11: 60 mg via INTRAMUSCULAR
  Filled 2011-12-11: qty 2

## 2011-12-11 MED ORDER — IBUPROFEN 600 MG PO TABS: 600.0000 mg | ORAL_TABLET | Freq: Four times a day (QID) | ORAL | Status: AC | PRN

## 2011-12-11 MED ORDER — MORPHINE SULFATE 4 MG/ML IJ SOLN
4.0000 mg | Freq: Once | INTRAMUSCULAR | Status: AC
  Administered 2011-12-11: 4 mg via INTRAMUSCULAR
  Filled 2011-12-11: qty 1

## 2011-12-11 MED ORDER — METOCLOPRAMIDE HCL 10 MG PO TABS: 10.0000 mg | ORAL_TABLET | Freq: Four times a day (QID) | ORAL | Status: DC

## 2011-12-11 MED ORDER — OXYCODONE-ACETAMINOPHEN 5-325 MG PO TABS: 1.0000 | ORAL_TABLET | ORAL | Status: AC | PRN

## 2011-12-11 MED ORDER — IBUPROFEN 600 MG PO TABS: 600.0000 mg | ORAL_TABLET | Freq: Four times a day (QID) | ORAL | Status: DC | PRN

## 2011-12-11 MED ORDER — TRAMADOL HCL 50 MG PO TABS
50.0000 mg | ORAL_TABLET | Freq: Once | ORAL | Status: AC
  Administered 2011-12-11: 50 mg via ORAL
  Filled 2011-12-11: qty 1

## 2011-12-11 MED ORDER — METOCLOPRAMIDE HCL 10 MG PO TABS
10.0000 mg | ORAL_TABLET | Freq: Once | ORAL | Status: AC
  Administered 2011-12-11: 10 mg via ORAL
  Filled 2011-12-11: qty 1

## 2011-12-11 MED ORDER — METHOCARBAMOL 500 MG PO TABS
1000.0000 mg | ORAL_TABLET | Freq: Once | ORAL | Status: AC
  Administered 2011-12-11: 1000 mg via ORAL
  Filled 2011-12-11: qty 2

## 2011-12-11 NOTE — ED Provider Notes (Signed)
History     CSN: [id number]  Arrival date & time 12/11/11  0807   First MD Initiated Contact with Patient 12/11/11 [phone]      Chief Complaint  Patient presents with  . Migraine  . Neck Pain    (Consider location/radiation/quality/duration/timing/severity/associated sxs/prior treatment) HPI Pt with history of chronic HA mostly affecting L side of head and face presents with similar HA that has been episodic x 2 days. Worse in the morning. Pain is sharp and mildly throbbing. + mild nausea at times. No photophobia, URI symptoms. No focal weakness numbness. Pt states the pain radiates to L side of face and L neck and shoulder. No fever, chills, neck stiffness Past Medical History  Diagnosis Date  . Ectopic pregnancy   . Hx of hysterectomy partial for cervicl CA 2003  . Sigmoidoscopy exam partial sigmoid colectomy 07-23-08  . Migraine headache     Past Surgical History  Procedure Date  . Partial hysterectomy 2003    Cervical Cancer  . Ectopic pregnancy surgery   . Partial sigmoid colectomy 07-23-08 07-23-08  . Oophorectomy   . Abdominal hysterectomy     History reviewed. No pertinent family history.  History  Substance Use Topics  . Smoking status: Never Smoker   . Smokeless tobacco: Not on file  . Alcohol Use: No    OB History    Grav Para Term Preterm Abortions TAB SAB Ect Mult Living                  Review of Systems  Constitutional: Negative for fever and chills.  HENT: Positive for neck pain. Negative for congestion, sore throat, facial swelling, rhinorrhea, neck stiffness and sinus pressure.   Eyes: Negative for photophobia and visual disturbance.  Gastrointestinal: Positive for nausea. Negative for vomiting and abdominal pain.  Skin: Negative for rash.  Neurological: Positive for headaches. Negative for dizziness, weakness, light-headedness and numbness.    Allergies  Iodine and Ivp dye  Home Medications   Current Outpatient Rx  Name Route Sig Dispense  Refill  . IBUPROFEN 600 MG PO TABS Oral Take 1 tablet (600 mg total) by mouth every 6 (six) hours as needed for pain. 30 tablet 0  . METOCLOPRAMIDE HCL 10 MG PO TABS Oral Take 1 tablet (10 mg total) by mouth every 6 (six) hours. 30 tablet 0  . OXYCODONE-ACETAMINOPHEN 5-325 MG PO TABS Oral Take 1 tablet by mouth every 4 (four) hours as needed for pain. 15 tablet 0    BP 122/82  Pulse 76  Resp 16  Ht 5\' 5"  (1.651 m)  Wt 227 lb (102.967 kg)  BMI 37.77 kg/m2  SpO2 100%  Physical Exam  Nursing note and vitals reviewed. Constitutional: She is oriented to person, place, and time. She appears well-developed and well-nourished. No distress.  HENT:  Head: Normocephalic and atraumatic.  Mouth/Throat: Oropharynx is clear and moist.       No sinus tenderness  Eyes: EOM are normal. Pupils are equal, round, and reactive to light.  Neck: Normal range of motion. Neck supple.       No meningismus. L cervical paraspinal and trapezius TTP  Cardiovascular: Normal rate and regular rhythm.   Pulmonary/Chest: Effort normal and breath sounds normal. No respiratory distress. She has no wheezes. She has no rales.  Abdominal: Soft. Bowel sounds are normal. She exhibits no mass. There is no tenderness. There is no rebound and no guarding.  Musculoskeletal: Normal range of motion. She exhibits no edema  and no tenderness.  Lymphadenopathy:    She has no cervical adenopathy.  Neurological: She is alert and oriented to person, place, and time.       5/5 motor, sensation intact, finger to nose intact  Skin: Skin is warm and dry. No rash noted. No erythema.  Psychiatric: She has a normal mood and affect. Her behavior is normal.    ED Course  Procedures (including critical care time)  Labs Reviewed - No data to display No results found.   1. Headache       MDM  No concerning findings on exam of pt with chronic HA's. Will treat symptomatically and refer to neurology for further management.    HA  improved. Asking to be d/c home.   Loren Racer, MD 12/11/11 1120

## 2011-12-11 NOTE — ED Notes (Signed)
Pt states she has been having migraine since Sunday which will not subside.  Pt states the left side of her neck has been hurting which is not typical for her with her migraines.  Some nausea.  No recent cold symptoms or fever.  No neuro deficits noted.

## 2011-12-11 NOTE — Discharge Instructions (Signed)

## 2012-01-27 ENCOUNTER — Encounter (HOSPITAL_BASED_OUTPATIENT_CLINIC_OR_DEPARTMENT_OTHER): Payer: Self-pay | Admitting: *Deleted

## 2012-01-27 ENCOUNTER — Emergency Department (HOSPITAL_BASED_OUTPATIENT_CLINIC_OR_DEPARTMENT_OTHER)
Admission: EM | Admit: 2012-01-27 | Discharge: 2012-01-27 | Disposition: A | Discharge status: Home / Self Care | Payer: Self-pay | Attending: Emergency Medicine | Admitting: Emergency Medicine

## 2012-01-27 ENCOUNTER — Other Ambulatory Visit (HOSPITAL_BASED_OUTPATIENT_CLINIC_OR_DEPARTMENT_OTHER): Payer: Self-pay | Admitting: Emergency Medicine

## 2012-01-27 ENCOUNTER — Ambulatory Visit (HOSPITAL_BASED_OUTPATIENT_CLINIC_OR_DEPARTMENT_OTHER)
Admit: 2012-01-27 | Discharge: 2012-01-27 | Disposition: A | Discharge status: Home / Self Care | Payer: Self-pay | Attending: Emergency Medicine | Admitting: Emergency Medicine

## 2012-01-27 DIAGNOSIS — Z905 Acquired absence of kidney: Secondary | ICD-10-CM | POA: Insufficient documentation

## 2012-01-27 DIAGNOSIS — N838 Other noninflammatory disorders of ovary, fallopian tube and broad ligament: Secondary | ICD-10-CM

## 2012-01-27 DIAGNOSIS — R1032 Left lower quadrant pain: Secondary | ICD-10-CM | POA: Insufficient documentation

## 2012-01-27 DIAGNOSIS — N839 Noninflammatory disorder of ovary, fallopian tube and broad ligament, unspecified: Secondary | ICD-10-CM | POA: Insufficient documentation

## 2012-01-27 DIAGNOSIS — N83209 Unspecified ovarian cyst, unspecified side: Secondary | ICD-10-CM | POA: Insufficient documentation

## 2012-01-27 DIAGNOSIS — Z9049 Acquired absence of other specified parts of digestive tract: Secondary | ICD-10-CM | POA: Insufficient documentation

## 2012-01-27 DIAGNOSIS — Z90710 Acquired absence of both cervix and uterus: Secondary | ICD-10-CM | POA: Insufficient documentation

## 2012-01-27 DIAGNOSIS — N390 Urinary tract infection, site not specified: Secondary | ICD-10-CM

## 2012-01-27 HISTORY — DX: Diverticulitis of intestine, part unspecified, without perforation or abscess without bleeding: K57.92

## 2012-01-27 LAB — URINALYSIS, ROUTINE W REFLEX MICROSCOPIC
Bilirubin Urine: NEGATIVE
Glucose, UA: NEGATIVE mg/dL
Ketones, ur: NEGATIVE mg/dL
Nitrite: NEGATIVE
Specific Gravity, Urine: 1.015 (ref 1.005–1.030)
pH: 6 (ref 5.0–8.0)

## 2012-01-27 LAB — URINE MICROSCOPIC-ADD ON

## 2012-01-27 MED ORDER — FLUCONAZOLE 50 MG PO TABS
150.0000 mg | ORAL_TABLET | Freq: Once | ORAL | Status: AC
  Administered 2012-01-27: 150 mg via ORAL
  Filled 2012-01-27: qty 1

## 2012-01-27 MED ORDER — CIPROFLOXACIN HCL 500 MG PO TABS
500.0000 mg | ORAL_TABLET | Freq: Once | ORAL | Status: AC
  Administered 2012-01-27: 500 mg via ORAL
  Filled 2012-01-27: qty 1

## 2012-01-27 MED ORDER — METOCLOPRAMIDE HCL 10 MG PO TABS: ORAL_TABLET | ORAL | Status: DC

## 2012-01-27 MED ORDER — METOCLOPRAMIDE HCL 5 MG/ML IJ SOLN
10.0000 mg | Freq: Once | INTRAMUSCULAR | Status: AC
  Administered 2012-01-27: 10 mg via INTRAVENOUS
  Filled 2012-01-27: qty 2

## 2012-01-27 MED ORDER — PHENAZOPYRIDINE HCL 200 MG PO TABS: 200.0000 mg | ORAL_TABLET | Freq: Three times a day (TID) | ORAL | Status: AC

## 2012-01-27 MED ORDER — PHENAZOPYRIDINE HCL 100 MG PO TABS
200.0000 mg | ORAL_TABLET | Freq: Once | ORAL | Status: AC
  Administered 2012-01-27: 200 mg via ORAL
  Filled 2012-01-27 (×2): qty 1

## 2012-01-27 MED ORDER — CIPROFLOXACIN HCL 500 MG PO TABS: 500.0000 mg | ORAL_TABLET | Freq: Two times a day (BID) | ORAL | Status: AC

## 2012-01-27 MED ORDER — SODIUM CHLORIDE 0.9 % IV SOLN
INTRAVENOUS | Status: DC
  Administered 2012-01-27: 04:00:00 via INTRAVENOUS

## 2012-01-27 MED ORDER — ONDANSETRON HCL 4 MG/2ML IJ SOLN
4.0000 mg | Freq: Once | INTRAMUSCULAR | Status: AC
  Administered 2012-01-27: 4 mg via INTRAVENOUS
  Filled 2012-01-27: qty 2

## 2012-01-27 NOTE — ED Notes (Signed)
Radiology speaking with pt. Regarding her u/s that has been ordered.  Pt. Still remains nauseated. Dr. Read Drivers ordered to continue fluids and will re-evaluate her nausea.

## 2012-01-27 NOTE — ED Notes (Signed)
MD at bedside. 

## 2012-01-27 NOTE — ED Provider Notes (Addendum)
History     CSN: [id number]  Arrival date & time 01/27/12  5284   First MD Initiated Contact with Patient 01/27/12 0402      Chief Complaint  Patient presents with  . Flank Pain    (Consider location/radiation/quality/duration/timing/severity/associated sxs/prior treatment) HPI This is a 36 year old black female with a 5 day history of left lower quadrant and left flank pain. The pain is been waxing and waning. It is getting worse. She describes it as a 6/10 at the present time. The pain is poorly characterized. The abdominal pain is worse with palpation or movement; the flank pain is not worse with palpation or movement. She has had nausea and frequent urination but no burning with urination. She denies fever, chills, nausea, vomiting, diarrhea, vaginal bleeding or vaginal discharge. She states she's felt like her abdomen has been distended or tight at times but not presently.  Past Medical History  Diagnosis Date  . Ectopic pregnancy   . Hx of hysterectomy partial for cervicl CA 2003  . Sigmoidoscopy exam partial sigmoid colectomy 07-23-08  . Migraine headache   . Diverticulitis     Past Surgical History  Procedure Date  . Partial hysterectomy 2003    Cervical Cancer  . Ectopic pregnancy surgery   . Partial sigmoid colectomy 07-23-08 07-23-08  . Oophorectomy   . Abdominal hysterectomy     History reviewed. No pertinent family history.  History  Substance Use Topics  . Smoking status: Never Smoker   . Smokeless tobacco: Not on file  . Alcohol Use: No    OB History    Grav Para Term Preterm Abortions TAB SAB Ect Mult Living                  Review of Systems  All other systems reviewed and are negative.    Allergies  Iodine and Ivp dye  Home Medications   Current Outpatient Rx  Name Route Sig Dispense Refill  . METOCLOPRAMIDE HCL 10 MG PO TABS Oral Take 1 tablet (10 mg total) by mouth every 6 (six) hours. 30 tablet 0    BP 127/82  Pulse 78  Temp 98.1 F  (36.7 C) (Oral)  Resp 18  SpO2 100%  Physical Exam General: Well-developed, well-nourished female in no acute distress; appearance consistent with age of record HENT: normocephalic, atraumatic Eyes: pupils equal round and reactive to light; extraocular muscles intact Neck: supple Heart: regular rate and rhythm Lungs: clear to auscultation bilaterally Abdomen: soft; nondistended; left suprapubic tenderness; bowel sounds present GU: No flank tenderness Extremities: No deformity; full range of motion Neurologic: Awake, alert and oriented; motor function intact in all extremities and symmetric; no facial droop Skin: Warm and dry Psychiatric: Normal mood and affect    ED Course  Procedures (including critical care time)     MDM   Nursing notes and vitals signs, including pulse oximetry, reviewed.  Summary of this visit's results, reviewed by myself:  Labs:  Results for orders placed during the hospital encounter of 01/27/12  URINALYSIS, ROUTINE W REFLEX MICROSCOPIC      Component Value Range   Color, Urine YELLOW  YELLOW   APPearance CLEAR  CLEAR   Specific Gravity, Urine 1.015  1.005 - 1.030   pH 6.0  5.0 - 8.0   Glucose, UA NEGATIVE  NEGATIVE mg/dL   Hgb urine dipstick TRACE (*) NEGATIVE   Bilirubin Urine NEGATIVE  NEGATIVE   Ketones, ur NEGATIVE  NEGATIVE mg/dL   Protein, ur NEGATIVE  NEGATIVE mg/dL   Urobilinogen, UA 0.2  0.0 - 1.0 mg/dL   Nitrite NEGATIVE  NEGATIVE   Leukocytes, UA SMALL (*) NEGATIVE  URINE MICROSCOPIC-ADD ON      Component Value Range   Squamous Epithelial / LPF FEW (*) RARE   WBC, UA 7-10  <3 WBC/hpf   RBC / HPF 3-6  <3 RBC/hpf   Bacteria, UA MANY (*) RARE   Urine-Other RARE YEAST      A review of patient's CT of the abdomen and pelvis from December of last year reveals no renal stones. CT did show an ovarian mass. Patient is to have followup ultrasound. We will schedule one as an outpatient.          Hanley Seamen, MD 01/27/12  0424  Hanley Seamen, MD 01/27/12 0430

## 2012-01-27 NOTE — ED Notes (Signed)
Pt with LLQ Pain and left flank pain off and on since Tuesday pt also with nausea and frequent urination denies hematuria fevers vomiting or diarrhea

## 2012-01-29 LAB — URINE CULTURE

## 2012-03-31 ENCOUNTER — Emergency Department (HOSPITAL_BASED_OUTPATIENT_CLINIC_OR_DEPARTMENT_OTHER)
Admission: EM | Admit: 2012-03-31 | Discharge: 2012-03-31 | Disposition: A | Discharge status: Home / Self Care | Payer: 59 | Attending: Emergency Medicine | Admitting: Emergency Medicine

## 2012-03-31 ENCOUNTER — Encounter (HOSPITAL_BASED_OUTPATIENT_CLINIC_OR_DEPARTMENT_OTHER): Payer: Self-pay | Admitting: *Deleted

## 2012-03-31 DIAGNOSIS — L02411 Cutaneous abscess of right axilla: Secondary | ICD-10-CM

## 2012-03-31 DIAGNOSIS — IMO0002 Reserved for concepts with insufficient information to code with codable children: Secondary | ICD-10-CM | POA: Insufficient documentation

## 2012-03-31 MED ORDER — LIDOCAINE-EPINEPHRINE 2 %-1:100000 IJ SOLN
20.0000 mL | Freq: Once | INTRAMUSCULAR | Status: AC
  Administered 2012-03-31: 20 mL via INTRADERMAL

## 2012-03-31 MED ORDER — SULFAMETHOXAZOLE-TRIMETHOPRIM 800-160 MG PO TABS: 1.0000 | ORAL_TABLET | Freq: Two times a day (BID) | ORAL | Status: DC

## 2012-03-31 MED ORDER — OXYCODONE-ACETAMINOPHEN 5-325 MG PO TABS: 1.0000 | ORAL_TABLET | ORAL | Status: DC | PRN

## 2012-03-31 MED ORDER — LIDOCAINE-EPINEPHRINE 2 %-1:100000 IJ SOLN
INTRAMUSCULAR | Status: AC
  Administered 2012-03-31: 20 mL via INTRADERMAL
  Filled 2012-03-31: qty 1

## 2012-03-31 NOTE — ED Notes (Signed)
Found lump under her right axillary area 3 days ago.  States size had decreased over the last 3 days.  Used warm compresses with some relief.

## 2012-03-31 NOTE — ED Provider Notes (Signed)
History     CSN: [id number]  Arrival date & time 03/31/12  0841   First MD Initiated Contact with Patient 03/31/12 0848      Chief Complaint  Patient presents with  . Abscess    (Consider location/radiation/quality/duration/timing/severity/associated sxs/prior treatment) Patient is a 36 y.o. female presenting with abscess. The history is provided by the patient. No language interpreter was used.  Abscess  This is a new problem. The current episode started less than one week ago. The onset was gradual. The problem occurs continuously. The problem has been gradually worsening. The abscess is present on the torso. The problem is moderate. The abscess is characterized by painfulness and swelling. It is unknown what she was exposed to. The abscess first occurred at home. Pertinent negatives include no anorexia, no decrease in physical activity, not sleeping less, not drinking less, no fever and no vomiting. Her past medical history does not include skin abscesses in family. There were no sick contacts. She has received no recent medical care.    Past Medical History  Diagnosis Date  . Ectopic pregnancy   . Hx of hysterectomy partial for cervicl CA 2003  . Sigmoidoscopy exam partial sigmoid colectomy 07-23-08  . Migraine headache   . Diverticulitis     Past Surgical History  Procedure Date  . Partial hysterectomy 2003    Cervical Cancer  . Ectopic pregnancy surgery   . Partial sigmoid colectomy 07-23-08 07-23-08  . Oophorectomy   . Abdominal hysterectomy     No family history on file.  History  Substance Use Topics  . Smoking status: Never Smoker   . Smokeless tobacco: Not on file  . Alcohol Use: No    OB History    Grav Para Term Preterm Abortions TAB SAB Ect Mult Living                  Review of Systems  Constitutional: Negative for fever.  Gastrointestinal: Negative for vomiting and anorexia.    Allergies  Iodine and Ivp dye  Home Medications   Current  Outpatient Rx  Name Route Sig Dispense Refill  . METOCLOPRAMIDE HCL 10 MG PO TABS Oral Take 1 tablet (10 mg total) by mouth every 6 (six) hours. 30 tablet 0  . METOCLOPRAMIDE HCL 10 MG PO TABS  Take 1 every 6 hours as needed for nausea. 10 tablet 0    There were no vitals taken for this visit.  Physical Exam  Nursing note and vitals reviewed. Constitutional: She appears well-developed and well-nourished.  HENT:  Head: Normocephalic and atraumatic.  Eyes: Conjunctivae and EOM are normal. Pupils are equal, round, and reactive to light.  Neck: Normal range of motion. Neck supple.  Cardiovascular: Normal rate, regular rhythm, normal heart sounds and intact distal pulses.   Pulmonary/Chest: Effort normal and breath sounds normal.  Abdominal: Soft. Bowel sounds are normal.  Musculoskeletal: Normal range of motion.  Neurological: She is alert.  Skin: Skin is warm and dry.       Right axilla with tender round nodule 2 cm with fluctuance  Psychiatric: She has a normal mood and affect. Thought content normal.    ED Course  Irrigation and debridement Date/Time: 03/31/2012 9:23 AM Performed by: Hilario Quarry Authorized by: Hilario Quarry Consent: Verbal consent obtained. Risks and benefits: risks, benefits and alternatives were discussed Consent given by: patient Patient identity confirmed: verbally with patient and arm band Time out: Immediately prior to procedure a "time out" was called  to verify the correct patient, procedure, equipment, support staff and site/side marked as required. Local anesthesia used: yes Anesthesia: local infiltration Local anesthetic: lidocaine 1% with epinephrine Anesthetic total: 2 ml Patient sedated: no Patient tolerance: Patient tolerated the procedure well with no immediate complications. Comments: Patient prepped and draped.  11 blade used and 1 cm incision made with moderate pus drained.  ABscess irrigated and packed with iodoform gauze 1/2 inch.     (including critical care time)  Labs Reviewed - No data to display No results found.   No diagnosis found.    MDM  Plan bactrim and follow up prn.         Hilario Quarry, MD 04/02/12 1700

## 2012-04-03 ENCOUNTER — Encounter (HOSPITAL_BASED_OUTPATIENT_CLINIC_OR_DEPARTMENT_OTHER): Payer: Self-pay | Admitting: Emergency Medicine

## 2012-04-03 ENCOUNTER — Emergency Department (HOSPITAL_BASED_OUTPATIENT_CLINIC_OR_DEPARTMENT_OTHER)
Admission: EM | Admit: 2012-04-03 | Discharge: 2012-04-03 | Disposition: A | Discharge status: Home / Self Care | Payer: 59 | Attending: Emergency Medicine | Admitting: Emergency Medicine

## 2012-04-03 DIAGNOSIS — IMO0002 Reserved for concepts with insufficient information to code with codable children: Secondary | ICD-10-CM | POA: Insufficient documentation

## 2012-04-03 DIAGNOSIS — L0291 Cutaneous abscess, unspecified: Secondary | ICD-10-CM

## 2012-04-03 MED ORDER — LIDOCAINE HCL 2 % IJ SOLN
INTRAMUSCULAR | Status: AC
  Filled 2012-04-03: qty 20

## 2012-04-03 MED ORDER — CEPHALEXIN 500 MG PO CAPS: 500.0000 mg | ORAL_CAPSULE | Freq: Four times a day (QID) | ORAL | Status: DC

## 2012-04-03 MED ORDER — LIDOCAINE HCL 2 % IJ SOLN
20.0000 mL | Freq: Once | INTRAMUSCULAR | Status: AC
  Administered 2012-04-03: 400 mg via INTRADERMAL

## 2012-04-03 NOTE — ED Notes (Signed)
Pt had I&D of abscess in right axillary on mon. Here for wound re-check. Pt states packing came out. Pt C/O red skin from tape of bandage.

## 2012-04-03 NOTE — ED Provider Notes (Signed)
History     CSN: [id number]  Arrival date & time 04/03/12  0215   First MD Initiated Contact with Patient 04/03/12 [phone]      Chief Complaint  Patient presents with  . Wound Check    (Consider location/radiation/quality/duration/timing/severity/associated sxs/prior treatment) Patient is a 36 y.o. female presenting with wound check. The history is provided by the patient. No language interpreter was used.  Wound Check  She was treated in the ED 3 to 5 days ago. Previous treatment in the ED includes I&D of abscess. Treatments since wound repair include oral antibiotics. Fever duration: none. There has been no drainage from the wound. The redness has not changed. The swelling has not changed. The pain has not changed. She has no difficulty moving the affected extremity or digit.  Redness below site from reaction to the tape.  Still swollen and painful  Past Medical History  Diagnosis Date  . Ectopic pregnancy   . Hx of hysterectomy partial for cervicl CA 2003  . Sigmoidoscopy exam partial sigmoid colectomy 07-23-08  . Migraine headache   . Diverticulitis     Past Surgical History  Procedure Date  . Partial hysterectomy 2003    Cervical Cancer  . Ectopic pregnancy surgery   . Partial sigmoid colectomy 07-23-08 07-23-08  . Oophorectomy   . Abdominal hysterectomy   . Foot surgery     No family history on file.  History  Substance Use Topics  . Smoking status: Never Smoker   . Smokeless tobacco: Not on file  . Alcohol Use: No    OB History    Grav Para Term Preterm Abortions TAB SAB Ect Mult Living                  Review of Systems  Constitutional: Negative for fever.  Skin: Positive for wound.  All other systems reviewed and are negative.    Allergies  Iodine and Ivp dye  Home Medications   Current Outpatient Rx  Name Route Sig Dispense Refill  . CEPHALEXIN 500 MG PO CAPS Oral Take 1 capsule (500 mg total) by mouth 4 (four) times daily. 28 capsule 0  .  METOCLOPRAMIDE HCL 10 MG PO TABS Oral Take 1 tablet (10 mg total) by mouth every 6 (six) hours. 30 tablet 0  . METOCLOPRAMIDE HCL 10 MG PO TABS  Take 1 every 6 hours as needed for nausea. 10 tablet 0  . OXYCODONE-ACETAMINOPHEN 5-325 MG PO TABS Oral Take 1 tablet by mouth every 4 (four) hours as needed for pain. 6 tablet 0  . SULFAMETHOXAZOLE-TRIMETHOPRIM 800-160 MG PO TABS Oral Take 1 tablet by mouth every 12 (twelve) hours. 14 tablet 0    BP 122/72  Pulse 84  Temp 98.2 F (36.8 C) (Oral)  Resp 18  SpO2 100%  Physical Exam  Constitutional: She is oriented to person, place, and time. She appears well-developed and well-nourished.  HENT:  Head: Normocephalic and atraumatic.  Mouth/Throat: Oropharynx is clear and moist.  Eyes: Conjunctivae normal are normal. Pupils are equal, round, and reactive to light.  Neck: Normal range of motion.  Cardiovascular: Normal rate and regular rhythm.   Pulmonary/Chest: Effort normal and breath sounds normal. She has no wheezes. She has no rales.  Abdominal: Soft. Bowel sounds are normal. There is no tenderness. There is no rebound and no guarding.  Musculoskeletal: Normal range of motion.  Neurological: She is alert and oriented to person, place, and time.  Skin: Skin is warm and dry.  Psychiatric: She has a normal mood and affect.    ED Course  Procedures (including critical care time)  Labs Reviewed - No data to display No results found.   1. Abscess    INCISION AND DRAINAGE Performed by: Jasmine Awe Consent: Verbal consent obtained. Risks and benefits: risks, benefits and alternatives were discussed Type: abscess  Body area: right axilla  Anesthesia: local infiltration cleansed with alcohol and chlorhexidine  Local anesthetic: lidocaine 2%  Anesthetic total: 3 ml  Complexity: complex, used previous incision opening Blunt dissection to break up loculations  Drainage: purulent  Drainage amount:2 cc  Packing  material: 1/4 in iodoform gauze  Patient tolerance: Patient tolerated the procedure well with no immediate complications.     MDM  Return for recheck and packing removal in 2 days.  No tape use.  Will add keflex.  Patient verbalizes understanding and agrees to follow up        Zari Cly Smitty Cords, MD 04/03/12 1610

## 2012-04-05 ENCOUNTER — Emergency Department (HOSPITAL_BASED_OUTPATIENT_CLINIC_OR_DEPARTMENT_OTHER)
Admission: EM | Admit: 2012-04-05 | Discharge: 2012-04-05 | Disposition: A | Discharge status: Home / Self Care | Payer: 59 | Attending: Emergency Medicine | Admitting: Emergency Medicine

## 2012-04-05 ENCOUNTER — Encounter (HOSPITAL_BASED_OUTPATIENT_CLINIC_OR_DEPARTMENT_OTHER): Payer: Self-pay | Admitting: *Deleted

## 2012-04-05 DIAGNOSIS — Z4801 Encounter for change or removal of surgical wound dressing: Secondary | ICD-10-CM | POA: Insufficient documentation

## 2012-04-05 DIAGNOSIS — Z5189 Encounter for other specified aftercare: Secondary | ICD-10-CM

## 2012-04-05 NOTE — ED Notes (Signed)
Pt states she was seen here on Monday for abscess under right arm (axillary) and again on Wed d/t packing coming out. Packing came out again last p.m., but pt states she was told to return here today anyway for recheck.

## 2012-04-05 NOTE — ED Provider Notes (Signed)
History  This chart was scribed for Derwood Kaplan, MD by Shari Heritage. The patient was seen in room MH10/MH10. Patient's care was started at 1521.     CSN: [id number]  Arrival date & time 04/05/12  1432   First MD Initiated Contact with Patient 04/05/12 1521      Chief Complaint  Patient presents with  . Wound Check    Patient is a 36 y.o. female presenting with wound check. The history is provided by the patient. No language interpreter was used.  Wound Check  She was treated in the ED 3 to 5 days ago. Previous treatment in the ED includes I&D of abscess. Treatments since wound repair include oral antibiotics, a wound recheck and regular soap and water washings. There has been colored discharge from the wound. There is no redness present. The pain has improved. She has no difficulty moving the affected extremity or digit.    Meredith Barnes is a 36 y.o. female who presents to the Emergency Department needing a wound check after her packing came out last night. Patient states that she noticed some drainage. Patient states that there is some associated soreness. Patient denies nausea, vomiting, fever or chills. She has been taking antibiotics as instructed. Patient has a medical history of diverticulitis.   Patient was seen here on 03/31/2012 by Dr. Margarita Grizzle complaining of an abscess to her right axilla. An I&D procedure was performed  for an abscess to her right axilla. An I&D procedure was performed with  Moderate pus drained. Patient was discharged with a prescriptions for Septra and Bactrim. Patient returned to the ED on 04/03/12 for a wound check after her packing fell out. Dr. Terressa Koyanagi performed a second I&D procedure and with purulent drainage. The wound area was packed with 1/4 iodoform gauze and patient was discharged with an additional prescription for Keflex.  Past Medical History  Diagnosis Date  . Ectopic pregnancy   . Hx of hysterectomy partial for cervicl CA 2003  .  Sigmoidoscopy exam partial sigmoid colectomy 07-23-08  . Migraine headache   . Diverticulitis     Past Surgical History  Procedure Date  . Partial hysterectomy 2003    Cervical Cancer  . Ectopic pregnancy surgery   . Partial sigmoid colectomy 07-23-08 07-23-08  . Oophorectomy   . Abdominal hysterectomy   . Foot surgery     History reviewed. No pertinent family history.  History  Substance Use Topics  . Smoking status: Never Smoker   . Smokeless tobacco: Not on file  . Alcohol Use: No    OB History    Grav Para Term Preterm Abortions TAB SAB Ect Mult Living                  Review of Systems  Constitutional: Negative for fever and chills.  Gastrointestinal: Negative for nausea and vomiting.  All other systems reviewed and are negative.    Allergies  Iodine and Ivp dye  Home Medications   Current Outpatient Rx  Name Route Sig Dispense Refill  . CEPHALEXIN 500 MG PO CAPS Oral Take 1 capsule (500 mg total) by mouth 4 (four) times daily. 28 capsule 0  . METOCLOPRAMIDE HCL 10 MG PO TABS Oral Take 1 tablet (10 mg total) by mouth every 6 (six) hours. 30 tablet 0  . METOCLOPRAMIDE HCL 10 MG PO TABS  Take 1 every 6 hours as needed for nausea. 10 tablet 0  . OXYCODONE-ACETAMINOPHEN 5-325 MG PO TABS Oral Take  1 tablet by mouth every 4 (four) hours as needed for pain. 6 tablet 0  . SULFAMETHOXAZOLE-TRIMETHOPRIM 800-160 MG PO TABS Oral Take 1 tablet by mouth every 12 (twelve) hours. 14 tablet 0    BP 112/73  Pulse 89  Temp 98 F (36.7 C) (Oral)  Resp 20  Ht 5\' 5"  (1.651 m)  Wt 240 lb (108.863 kg)  BMI 39.94 kg/m2  SpO2 99%  Physical Exam  Constitutional: She appears well-developed and well-nourished.  HENT:  Head: Normocephalic and atraumatic.  Eyes: EOM are normal. Pupils are equal, round, and reactive to light.  Cardiovascular: Normal rate and regular rhythm.   No murmur heard. Pulmonary/Chest: Effort normal and breath sounds normal. No respiratory distress. She has  no wheezes. She has no rales.       Lungs clear to auscultation.  Abdominal: Soft. Bowel sounds are normal. She exhibits no distension. There is no tenderness. There is no rebound.  Musculoskeletal: Normal range of motion.  Skin: Skin is warm.       Clean 0.5 cm incision with slight induration to the right axilla. No surrounding erythema. Serosanguinous discharge with mild purulence draining from site.  Psychiatric: She has a normal mood and affect. Her behavior is normal.    ED Course  Procedures (including critical care time) DIAGNOSTIC STUDIES: Oxygen Saturation is 98% on room air, normal by my interpretation.    COORDINATION OF CARE: 3:47pm- Patient informed of current plan for treatment and evaluation and agrees with plan at this time.      Labs Reviewed - No data to display No results found.   No diagnosis found.    MDM  Medical screening examination/treatment/procedure(s) were performed by me as the supervising physician. Scribe service was utilized for documentation only.  Medical screening examination/treatment/procedure(s) were performed by me as the supervising physician. Scribe service was utilized for documentation only.  Pt comes in with cc of wound check. Her packing had fallen off. The exam shows some mild induration around the wound, and tenderness, otherwise it is cleat, and i was able to express some sero-sanguinous discharge, may be a little purulence. I don't see any utility in packing the wounds - so i expressed as much fluid out as i could. I advocated that patient use warm compresses 4 times a day, and change the dressing 4 times a day - especially since it is hard for her to keep the gauze in 1 place. She will be using bacitracin that i have provided. She understands that if the wound gets larger, more pain, she needs to return to the ED, and that she still needs to finish the antibiotic course.   Derwood Kaplan, MD 04/05/12 [phone]

## 2012-05-27 ENCOUNTER — Ambulatory Visit (INDEPENDENT_AMBULATORY_CARE_PROVIDER_SITE_OTHER): Payer: 59 | Admitting: Family Medicine

## 2012-05-27 ENCOUNTER — Encounter: Payer: Self-pay | Admitting: Family Medicine

## 2012-05-27 VITALS — BP 112/75 | HR 81 | Temp 98.4°F | Ht 65.0 in | Wt 241.0 lb

## 2012-05-27 DIAGNOSIS — Z23 Encounter for immunization: Secondary | ICD-10-CM

## 2012-05-27 DIAGNOSIS — IMO0001 Reserved for inherently not codable concepts without codable children: Secondary | ICD-10-CM

## 2012-05-27 DIAGNOSIS — R638 Other symptoms and signs concerning food and fluid intake: Secondary | ICD-10-CM

## 2012-05-27 DIAGNOSIS — R7301 Impaired fasting glucose: Secondary | ICD-10-CM

## 2012-05-27 DIAGNOSIS — J029 Acute pharyngitis, unspecified: Secondary | ICD-10-CM

## 2012-05-27 NOTE — Patient Instructions (Signed)

## 2012-05-27 NOTE — Addendum Note (Signed)
Addended by: Nani Gasser D on: 05/27/2012 12:41 PM   Modules accepted: Level of Service

## 2012-05-27 NOTE — Progress Notes (Addendum)
  Subjective:    Patient ID: Meredith Barnes, female    DOB: July 26, 1975, 36 y.o.   MRN: [id number]  HPI ST started yesterday. Burning and dry sensation. No sick contacts. Has felt dehydrated the last couple of days.  Burning when she drinks something. She does work in a [employer], structural.  No fever.  No other URI sxs. Feels some swelling in her neck on the right side. She reports that she has been extremely thirsty 4 days. No prior history of diabetes. She did see her GI yesterday who diagnosed her with a yeast infection and she is currently on treatment for that.  She is on Symbicort for her asthma. No recent flares.   Review of Systems     Objective:   Physical Exam  Constitutional: She is oriented to person, place, and time. She appears well-developed and well-nourished.  HENT:  Head: Normocephalic and atraumatic.  Right Ear: External ear normal.  Left Ear: External ear normal.  Nose: Nose normal.  Mouth/Throat: Oropharynx is clear and moist.       TMs and canals are clear.   Eyes: Conjunctivae normal and EOM are normal. Pupils are equal, round, and reactive to light.  Neck: Neck supple. No thyromegaly present.  Cardiovascular: Normal rate, regular rhythm and normal heart sounds.   Pulmonary/Chest: Effort normal and breath sounds normal. She has no wheezes.  Lymphadenopathy:    She has no cervical adenopathy.  Neurological: She is alert and oriented to person, place, and time.  Skin: Skin is warm and dry.  Psychiatric: She has a normal mood and affect.          Assessment & Plan:  Pharyngitis - likely viral.Strep is negative. Call if gets worse or suddenly had a fever. Expect to have symptoms for about a week. Recommend over-the-counter zinc lozenges since this is still early. Also can use saltwater gargles if needed. Handout given.  Polydipsia-wants check a hemoglobin A1c today just to evaluate for diabetes and she has had increased thirst.   IFG - A1C was 5.7. Disucssed dx of  IFG. Discussed need for diet changes and exercis and recommend repeat in6 mo.

## 2012-07-04 ENCOUNTER — Emergency Department (HOSPITAL_BASED_OUTPATIENT_CLINIC_OR_DEPARTMENT_OTHER): Payer: 59

## 2012-07-04 ENCOUNTER — Encounter (HOSPITAL_BASED_OUTPATIENT_CLINIC_OR_DEPARTMENT_OTHER): Payer: Self-pay | Admitting: Emergency Medicine

## 2012-07-04 ENCOUNTER — Emergency Department (HOSPITAL_BASED_OUTPATIENT_CLINIC_OR_DEPARTMENT_OTHER)
Admission: EM | Admit: 2012-07-04 | Discharge: 2012-07-04 | Disposition: A | Discharge status: Home / Self Care | Payer: 59 | Attending: Emergency Medicine | Admitting: Emergency Medicine

## 2012-07-04 DIAGNOSIS — Z8541 Personal history of malignant neoplasm of cervix uteri: Secondary | ICD-10-CM | POA: Insufficient documentation

## 2012-07-04 DIAGNOSIS — N83209 Unspecified ovarian cyst, unspecified side: Secondary | ICD-10-CM

## 2012-07-04 DIAGNOSIS — Z90711 Acquired absence of uterus with remaining cervical stump: Secondary | ICD-10-CM | POA: Insufficient documentation

## 2012-07-04 DIAGNOSIS — B9689 Other specified bacterial agents as the cause of diseases classified elsewhere: Secondary | ICD-10-CM

## 2012-07-04 DIAGNOSIS — Z8742 Personal history of other diseases of the female genital tract: Secondary | ICD-10-CM | POA: Insufficient documentation

## 2012-07-04 DIAGNOSIS — N949 Unspecified condition associated with female genital organs and menstrual cycle: Secondary | ICD-10-CM | POA: Insufficient documentation

## 2012-07-04 DIAGNOSIS — R102 Pelvic and perineal pain: Secondary | ICD-10-CM

## 2012-07-04 DIAGNOSIS — Z8679 Personal history of other diseases of the circulatory system: Secondary | ICD-10-CM | POA: Insufficient documentation

## 2012-07-04 DIAGNOSIS — Z8719 Personal history of other diseases of the digestive system: Secondary | ICD-10-CM | POA: Insufficient documentation

## 2012-07-04 DIAGNOSIS — N760 Acute vaginitis: Secondary | ICD-10-CM | POA: Insufficient documentation

## 2012-07-04 DIAGNOSIS — Z79899 Other long term (current) drug therapy: Secondary | ICD-10-CM | POA: Insufficient documentation

## 2012-07-04 HISTORY — DX: Follicular cyst of ovary, unspecified side: N83.00

## 2012-07-04 LAB — PREGNANCY, URINE: Preg Test, Ur: NEGATIVE

## 2012-07-04 LAB — CBC WITH DIFFERENTIAL/PLATELET
Basophils Absolute: 0 10*3/uL (ref 0.0–0.1)
HCT: 34.2 % — ABNORMAL LOW (ref 36.0–46.0)
Hemoglobin: 11.2 g/dL — ABNORMAL LOW (ref 12.0–15.0)
Lymphocytes Relative: 39 % (ref 12–46)
Monocytes Absolute: 0.6 10*3/uL (ref 0.1–1.0)
Monocytes Relative: 8 % (ref 3–12)
Neutro Abs: 4.1 10*3/uL (ref 1.7–7.7)
RDW: 16.8 % — ABNORMAL HIGH (ref 11.5–15.5)
WBC: 7.9 10*3/uL (ref 4.0–10.5)

## 2012-07-04 LAB — URINALYSIS, ROUTINE W REFLEX MICROSCOPIC
Glucose, UA: NEGATIVE mg/dL
Specific Gravity, Urine: 1.009 (ref 1.005–1.030)
pH: 6 (ref 5.0–8.0)

## 2012-07-04 LAB — COMPREHENSIVE METABOLIC PANEL
ALT: 11 U/L (ref 0–35)
AST: 12 U/L (ref 0–37)
Alkaline Phosphatase: 53 U/L (ref 39–117)
CO2: 27 mEq/L (ref 19–32)
Chloride: 104 mEq/L (ref 96–112)
Creatinine, Ser: 0.7 mg/dL (ref 0.50–1.10)
GFR calc non Af Amer: >90 mL/min (ref >90)
Total Bilirubin: 0.2 mg/dL — ABNORMAL LOW (ref 0.3–1.2)

## 2012-07-04 LAB — WET PREP, GENITAL: Trich, Wet Prep: NONE SEEN

## 2012-07-04 LAB — GC/CHLAMYDIA PROBE AMP
CT Probe RNA: NEGATIVE
GC Probe RNA: NEGATIVE

## 2012-07-04 LAB — URINE MICROSCOPIC-ADD ON

## 2012-07-04 MED ORDER — SODIUM CHLORIDE 0.9 % IV BOLUS (SEPSIS)
1000.0000 mL | Freq: Once | INTRAVENOUS | Status: AC
  Administered 2012-07-04: 1000 mL via INTRAVENOUS

## 2012-07-04 MED ORDER — IOHEXOL 300 MG/ML  SOLN
100.0000 mL | Freq: Once | INTRAMUSCULAR | Status: AC | PRN
  Administered 2012-07-04: 100 mL via INTRAVENOUS

## 2012-07-04 MED ORDER — METRONIDAZOLE 500 MG PO TABS: 500.0000 mg | ORAL_TABLET | Freq: Two times a day (BID) | ORAL | Status: DC

## 2012-07-04 MED ORDER — KETOROLAC TROMETHAMINE 30 MG/ML IJ SOLN
30.0000 mg | Freq: Once | INTRAMUSCULAR | Status: AC
  Administered 2012-07-04: 30 mg via INTRAVENOUS
  Filled 2012-07-04: qty 1

## 2012-07-04 MED ORDER — HYDROCODONE-ACETAMINOPHEN 5-500 MG PO TABS: 1.0000 | ORAL_TABLET | Freq: Four times a day (QID) | ORAL | Status: AC | PRN

## 2012-07-04 NOTE — ED Notes (Signed)
Pt returned from CT. Pt ambulatory to bathroom without difficulty.  

## 2012-07-04 NOTE — ED Notes (Signed)
Pt recently evaluated by gyn Md for cyst on ovary, scheduled for surgery in January, pt developed acute onset of lower abdominal pain tonight while at work, states that this pain does not feel like the ovary cyst pain, +nausea,

## 2012-07-04 NOTE — ED Provider Notes (Signed)
History     CSN: [id number]  Arrival date & time 07/04/12  0133   First MD Initiated Contact with Patient 07/04/12 0145      Chief Complaint  Patient presents with  . Abdominal Pain    (Consider location/radiation/quality/duration/timing/severity/associated sxs/prior treatment) HPI Comments: Patient with history of left-sided ovarian cyst for which she is due to have surgery January.  She has undergone prior surgery for the same in the right ovary and had the ovary removed.  She started tonight with pain in the lower abdomen that is different from her ovarian cyst pain.  She reports a vaginal discharge but no bleeding.  There are no urinary complaints and she denies fevers or chills.  No diarrhea or constipation.  Patient is a 36 y.o. female presenting with abdominal pain. The history is provided by the patient.  Abdominal Pain The primary symptoms of the illness include abdominal pain and vaginal discharge. The primary symptoms of the illness do not include fever, nausea, vomiting, diarrhea, dysuria or vaginal bleeding. Episode onset: this evening. The onset of the illness was gradual. The problem has been rapidly worsening.  The vaginal discharge is not associated with dysuria.   The patient states that she believes she is currently not pregnant. The patient has not had a change in bowel habit. Symptoms associated with the illness do not include chills, constipation, frequency or back pain.    Past Medical History  Diagnosis Date  . Ectopic pregnancy   . Hx of hysterectomy partial for cervicl CA 2003  . Sigmoidoscopy exam partial sigmoid colectomy 07-23-08  . Migraine headache   . Diverticulitis   . Cyst, ovary, follicular     Past Surgical History  Procedure Date  . Partial hysterectomy 2003    Cervical Cancer  . Ectopic pregnancy surgery   . Partial sigmoid colectomy 07-23-08 07-23-08  . Oophorectomy   . Abdominal hysterectomy   . Foot surgery     History reviewed. No  pertinent family history.  History  Substance Use Topics  . Smoking status: Never Smoker   . Smokeless tobacco: Not on file  . Alcohol Use: No    OB History    Grav Para Term Preterm Abortions TAB SAB Ect Mult Living                  Review of Systems  Constitutional: Negative for fever and chills.  Gastrointestinal: Positive for abdominal pain. Negative for nausea, vomiting, diarrhea and constipation.  Genitourinary: Positive for vaginal discharge. Negative for dysuria, frequency and vaginal bleeding.  Musculoskeletal: Negative for back pain.  All other systems reviewed and are negative.    Allergies  Iodine and Ivp dye  Home Medications   Current Outpatient Rx  Name  Route  Sig  Dispense  Refill  . BUDESONIDE-FORMOTEROL FUMARATE 160-4.5 MCG/ACT IN AERO   Inhalation   Inhale 2 puffs into the lungs 2 (two) times daily.         Marland Kitchen METOCLOPRAMIDE HCL 10 MG PO TABS   Oral   Take 1 tablet (10 mg total) by mouth every 6 (six) hours.   30 tablet   0     BP 128/83  Pulse 68  Temp 98.2 F (36.8 C) (Oral)  Resp 16  Ht 5\' 5"  (1.651 m)  Wt 240 lb (108.863 kg)  BMI 39.94 kg/m2  SpO2 100%  Physical Exam  Nursing note and vitals reviewed. Constitutional: She is oriented to person, place, and time. She appears  well-developed and well-nourished. No distress.  HENT:  Head: Normocephalic and atraumatic.  Neck: Normal range of motion. Neck supple.  Cardiovascular: Normal rate and regular rhythm.  Exam reveals no gallop and no friction rub.   No murmur heard. Pulmonary/Chest: Effort normal and breath sounds normal. No respiratory distress. She has no wheezes.  Abdominal: Soft. Bowel sounds are normal. She exhibits no distension. There is no rebound and no guarding.       There is ttp across the lower abdomen and umbilicus.    Genitourinary: Uterus normal. Vaginal discharge found.       There is a grayish vaginal discharge present.  Musculoskeletal: Normal range of  motion.  Neurological: She is alert and oriented to person, place, and time.  Skin: Skin is warm and dry. She is not diaphoretic.    ED Course  Procedures (including critical care time)  Labs Reviewed  URINALYSIS, ROUTINE W REFLEX MICROSCOPIC - Abnormal; Notable for the following:    APPearance CLOUDY (*)     Hgb urine dipstick SMALL (*)     All other components within normal limits  URINE MICROSCOPIC-ADD ON - Abnormal; Notable for the following:    Squamous Epithelial / LPF MANY (*)     Bacteria, UA MANY (*)     All other components within normal limits  PREGNANCY, URINE  CBC WITH DIFFERENTIAL  COMPREHENSIVE METABOLIC PANEL  WET PREP, GENITAL  GC/CHLAMYDIA PROBE AMP   No results found.   No diagnosis found.    MDM  The patient presents with lower abdominal pain and has a known left ovarian cyst.  Her pain was worse tonight and was different from the pain she was having with the cyst.  Workup reveals no elevation of wbc, ua that is negative, and ct that shows a stable appearance of the cyst and no other acute pathology.  The pelvic exam revealed moderate gray discharge with moderate clue cells on the wet prep.  As she reports a discharge, she will be discharged with flagyl, pain medications.  To follow up with Gyn, return prn.        Geoffery Lyons, MD 07/04/12 [id number]

## 2012-07-26 ENCOUNTER — Emergency Department (HOSPITAL_BASED_OUTPATIENT_CLINIC_OR_DEPARTMENT_OTHER)
Admission: EM | Admit: 2012-07-26 | Discharge: 2012-07-26 | Disposition: A | Discharge status: Home / Self Care | Payer: 59 | Attending: Emergency Medicine | Admitting: Emergency Medicine

## 2012-07-26 ENCOUNTER — Encounter (HOSPITAL_BASED_OUTPATIENT_CLINIC_OR_DEPARTMENT_OTHER): Payer: Self-pay | Admitting: *Deleted

## 2012-07-26 ENCOUNTER — Emergency Department (HOSPITAL_BASED_OUTPATIENT_CLINIC_OR_DEPARTMENT_OTHER): Payer: 59

## 2012-07-26 DIAGNOSIS — Z8742 Personal history of other diseases of the female genital tract: Secondary | ICD-10-CM | POA: Insufficient documentation

## 2012-07-26 DIAGNOSIS — Z8679 Personal history of other diseases of the circulatory system: Secondary | ICD-10-CM | POA: Insufficient documentation

## 2012-07-26 DIAGNOSIS — Z90710 Acquired absence of both cervix and uterus: Secondary | ICD-10-CM | POA: Insufficient documentation

## 2012-07-26 DIAGNOSIS — Z79899 Other long term (current) drug therapy: Secondary | ICD-10-CM | POA: Insufficient documentation

## 2012-07-26 DIAGNOSIS — R05 Cough: Secondary | ICD-10-CM

## 2012-07-26 DIAGNOSIS — B349 Viral infection, unspecified: Secondary | ICD-10-CM

## 2012-07-26 DIAGNOSIS — B9789 Other viral agents as the cause of diseases classified elsewhere: Secondary | ICD-10-CM | POA: Insufficient documentation

## 2012-07-26 DIAGNOSIS — Z8719 Personal history of other diseases of the digestive system: Secondary | ICD-10-CM | POA: Insufficient documentation

## 2012-07-26 DIAGNOSIS — R059 Cough, unspecified: Secondary | ICD-10-CM | POA: Insufficient documentation

## 2012-07-26 MED ORDER — ALBUTEROL SULFATE HFA 108 (90 BASE) MCG/ACT IN AERS: 1.0000 | INHALATION_SPRAY | RESPIRATORY_TRACT | Status: DC | PRN

## 2012-07-26 MED ORDER — ALBUTEROL SULFATE (5 MG/ML) 0.5% IN NEBU
5.0000 mg | INHALATION_SOLUTION | Freq: Once | RESPIRATORY_TRACT | Status: AC
  Administered 2012-07-26: 5 mg via RESPIRATORY_TRACT
  Filled 2012-07-26: qty 1

## 2012-07-26 NOTE — ED Notes (Signed)
C/o cough x 2 weeks with some phlegm at times. Pt denies fever.

## 2012-07-26 NOTE — ED Notes (Signed)
Pt returned from xray

## 2012-07-26 NOTE — ED Provider Notes (Signed)
History     CSN: [id number]  Arrival date & time 07/26/12  0208   First MD Initiated Contact with Patient 07/26/12 0308      Chief Complaint  Patient presents with  . Cough    (Consider location/radiation/quality/duration/timing/severity/associated sxs/prior treatment) The history is provided by the patient.  Meredith Barnes is a 37 y.o. female hx of migraines, diverticulitis here with cough. Productive cough with phlegm for 2 weeks. Tried some over-the-counter medicines without improvement. Today was at work and she was coughing a lot. No SOB or CP. No hx of COPD or asthma. Not a smoker.      Past Medical History  Diagnosis Date  . Ectopic pregnancy   . Hx of hysterectomy partial for cervicl CA 2003  . Sigmoidoscopy exam partial sigmoid colectomy 07-23-08  . Migraine headache   . Diverticulitis   . Cyst, ovary, follicular     Past Surgical History  Procedure Date  . Partial hysterectomy 2003    Cervical Cancer  . Ectopic pregnancy surgery   . Partial sigmoid colectomy 07-23-08 07-23-08  . Oophorectomy   . Abdominal hysterectomy   . Foot surgery     History reviewed. No pertinent family history.  History  Substance Use Topics  . Smoking status: Never Smoker   . Smokeless tobacco: Not on file  . Alcohol Use: No    OB History    Grav Para Term Preterm Abortions TAB SAB Ect Mult Living                  Review of Systems  Respiratory: Positive for cough.   All other systems reviewed and are negative.    Allergies  Iodine and Ivp dye  Home Medications   Current Outpatient Rx  Name  Route  Sig  Dispense  Refill  . BUDESONIDE-FORMOTEROL FUMARATE 160-4.5 MCG/ACT IN AERO   Inhalation   Inhale 2 puffs into the lungs 2 (two) times daily.         Marland Kitchen METOCLOPRAMIDE HCL 10 MG PO TABS   Oral   Take 1 tablet (10 mg total) by mouth every 6 (six) hours.   30 tablet   0   . METRONIDAZOLE 500 MG PO TABS   Oral   Take 1 tablet (500 mg total) by mouth 2 (two)  times daily. One po bid x 7 days   14 tablet   0     BP 121/83  Pulse 80  Temp 98.7 F (37.1 C) (Oral)  Resp 18  Ht 5\' 5"  (1.651 m)  Wt 240 lb (108.863 kg)  BMI 39.94 kg/m2  SpO2 100%  Physical Exam  Nursing note and vitals reviewed. Constitutional: She is oriented to person, place, and time. She appears well-developed and well-nourished.       Well appearing, NAD   HENT:  Head: Normocephalic.  Mouth/Throat: Oropharynx is clear and moist.  Eyes: Conjunctivae normal are normal. Pupils are equal, round, and reactive to light.  Neck: Normal range of motion. Neck supple.  Cardiovascular: Normal rate, regular rhythm and normal heart sounds.   Pulmonary/Chest: Effort normal and breath sounds normal.       No wheezing or stridor or crackles   Abdominal: Soft. Bowel sounds are normal. She exhibits no distension. There is no tenderness. There is no rebound.  Musculoskeletal: Normal range of motion. She exhibits no edema and no tenderness.  Neurological: She is alert and oriented to person, place, and time.  Skin: Skin is warm  and dry.  Psychiatric: She has a normal mood and affect. Her behavior is normal. Judgment and thought content normal.    ED Course  Procedures (including critical care time)  Labs Reviewed - No data to display Dg Chest 2 View  07/26/2012  *RADIOLOGY REPORT*  Clinical Data: Cough for 2 weeks  CHEST - 2 VIEW  Comparison: None  Findings: Upper-normal size of cardiac silhouette. Mediastinal contours and pulmonary vascularity normal. Lungs clear. No pleural effusion or pneumothorax. Bones unremarkable.  IMPRESSION: No acute abnormalities.   Original Report Authenticated By: Ulyses Southward, M.D.      No diagnosis found.    MDM  Meredith Barnes is a 37 y.o. female here with cough. Likely viral syndrome. CXR clear. Felt better with nebs. Will d/c home with albuterol prn and continue over the counter meds.         Richardean Canal, MD 07/26/12 [id number]

## 2012-07-26 NOTE — ED Notes (Signed)
Patient transported to X-ray 

## 2012-08-25 ENCOUNTER — Encounter (HOSPITAL_BASED_OUTPATIENT_CLINIC_OR_DEPARTMENT_OTHER): Payer: Self-pay | Admitting: *Deleted

## 2012-08-25 ENCOUNTER — Emergency Department (HOSPITAL_BASED_OUTPATIENT_CLINIC_OR_DEPARTMENT_OTHER)
Admission: EM | Admit: 2012-08-25 | Discharge: 2012-08-26 | Disposition: A | Discharge status: Home / Self Care | Payer: 59 | Attending: Emergency Medicine | Admitting: Emergency Medicine

## 2012-08-25 DIAGNOSIS — Z8719 Personal history of other diseases of the digestive system: Secondary | ICD-10-CM | POA: Insufficient documentation

## 2012-08-25 DIAGNOSIS — R197 Diarrhea, unspecified: Secondary | ICD-10-CM | POA: Insufficient documentation

## 2012-08-25 DIAGNOSIS — K5289 Other specified noninfective gastroenteritis and colitis: Secondary | ICD-10-CM | POA: Insufficient documentation

## 2012-08-25 DIAGNOSIS — IMO0002 Reserved for concepts with insufficient information to code with codable children: Secondary | ICD-10-CM | POA: Insufficient documentation

## 2012-08-25 DIAGNOSIS — K529 Noninfective gastroenteritis and colitis, unspecified: Secondary | ICD-10-CM

## 2012-08-25 DIAGNOSIS — Z8742 Personal history of other diseases of the female genital tract: Secondary | ICD-10-CM | POA: Insufficient documentation

## 2012-08-25 DIAGNOSIS — R109 Unspecified abdominal pain: Secondary | ICD-10-CM | POA: Insufficient documentation

## 2012-08-25 DIAGNOSIS — Z8679 Personal history of other diseases of the circulatory system: Secondary | ICD-10-CM | POA: Insufficient documentation

## 2012-08-25 DIAGNOSIS — Z79899 Other long term (current) drug therapy: Secondary | ICD-10-CM | POA: Insufficient documentation

## 2012-08-25 DIAGNOSIS — Z90710 Acquired absence of both cervix and uterus: Secondary | ICD-10-CM | POA: Insufficient documentation

## 2012-08-25 LAB — URINALYSIS, ROUTINE W REFLEX MICROSCOPIC
Bilirubin Urine: NEGATIVE
Ketones, ur: NEGATIVE mg/dL
Nitrite: NEGATIVE
Protein, ur: 30 mg/dL — AB
Urobilinogen, UA: 0.2 mg/dL (ref 0.0–1.0)

## 2012-08-25 MED ORDER — ONDANSETRON HCL 4 MG/2ML IJ SOLN
INTRAMUSCULAR | Status: AC
  Filled 2012-08-25: qty 2

## 2012-08-25 MED ORDER — ONDANSETRON HCL 4 MG/2ML IJ SOLN
4.0000 mg | Freq: Once | INTRAMUSCULAR | Status: AC
  Administered 2012-08-25: 4 mg via INTRAVENOUS

## 2012-08-25 NOTE — ED Notes (Signed)
Abdominal pain vomiting and diarrhea since 3am.

## 2012-08-25 NOTE — ED Provider Notes (Signed)
History     CSN: [id number]  Arrival date & time 08/25/12  2127   First MD Initiated Contact with Patient 08/25/12 2310      Chief Complaint  Patient presents with  . Emesis    (Consider location/radiation/quality/duration/timing/severity/associated sxs/prior treatment) Patient is a 37 y.o. female presenting with vomiting. The history is provided by the patient. No language interpreter was used.  Emesis  This is a new problem. The current episode started yesterday. The problem occurs 5 to 10 times per day. The problem has been gradually worsening. The emesis has an appearance of stomach contents. There has been no fever. Associated symptoms include abdominal pain and diarrhea. Pertinent negatives include no chills. Risk factors: hx of abdominal surgery 1/9 to remove ovary.    Past Medical History  Diagnosis Date  . Ectopic pregnancy   . Hx of hysterectomy partial for cervicl CA 2003  . Sigmoidoscopy exam partial sigmoid colectomy 07-23-08  . Migraine headache   . Diverticulitis   . Cyst, ovary, follicular     Past Surgical History  Procedure Date  . Partial hysterectomy 2003    Cervical Cancer  . Ectopic pregnancy surgery   . Partial sigmoid colectomy 07-23-08 07-23-08  . Oophorectomy   . Abdominal hysterectomy   . Foot surgery     No family history on file.  History  Substance Use Topics  . Smoking status: Never Smoker   . Smokeless tobacco: Not on file  . Alcohol Use: No    OB History    Grav Para Term Preterm Abortions TAB SAB Ect Mult Living                  Review of Systems  Constitutional: Negative for chills.  Gastrointestinal: Positive for vomiting, abdominal pain and diarrhea.  All other systems reviewed and are negative.    Allergies  Iodine and Ivp dye  Home Medications   Current Outpatient Rx  Name  Route  Sig  Dispense  Refill  . ALBUTEROL SULFATE HFA 108 (90 BASE) MCG/ACT IN AERS   Inhalation   Inhale 1-2 puffs into the lungs every 4  (four) hours as needed for wheezing.   1 Inhaler   0   . BUDESONIDE-FORMOTEROL FUMARATE 160-4.5 MCG/ACT IN AERO   Inhalation   Inhale 2 puffs into the lungs 2 (two) times daily.         Marland Kitchen METOCLOPRAMIDE HCL 10 MG PO TABS   Oral   Take 1 tablet (10 mg total) by mouth every 6 (six) hours.   30 tablet   0   . METRONIDAZOLE 500 MG PO TABS   Oral   Take 1 tablet (500 mg total) by mouth 2 (two) times daily. One po bid x 7 days   14 tablet   0     BP 115/77  Pulse 99  Temp 98.6 F (37 C) (Oral)  Resp 16  SpO2 100%  Physical Exam  Nursing note and vitals reviewed. Constitutional: She appears well-developed and well-nourished.  HENT:  Head: Normocephalic.  Right Ear: External ear normal.  Left Ear: External ear normal.  Nose: Nose normal.  Mouth/Throat: Oropharynx is clear and moist.  Eyes: Pupils are equal, round, and reactive to light.  Neck: Normal range of motion. Neck supple.  Cardiovascular: Normal rate, normal heart sounds and intact distal pulses.   Pulmonary/Chest: Effort normal and breath sounds normal.  Abdominal: Soft. There is tenderness. There is guarding.  Musculoskeletal: Normal range of motion.  Neurological: She is alert.  Skin: Skin is warm.  Psychiatric: She has a normal mood and affect.    ED Course  Procedures (including critical care time)  Labs Reviewed  URINALYSIS, ROUTINE W REFLEX MICROSCOPIC - Abnormal; Notable for the following:    APPearance CLOUDY (*)     Hgb urine dipstick MODERATE (*)     Protein, ur 30 (*)     All other components within normal limits  URINE MICROSCOPIC-ADD ON - Abnormal; Notable for the following:    Bacteria, UA FEW (*)     All other components within normal limits   No results found.   No diagnosis found.    MDM    Labs and ct ordered,  Pt given fluids, zofran and dilaudid.   Pt's care turned over to Dr. Read Drivers  At 12 midnight      Lonia Skinner [place], [place] 08/25/12 2328

## 2012-08-25 NOTE — ED Notes (Signed)
Pt reports that she had ovaries removed in [place], she had follow up appt with gyn md today, was told she had virus, pt states vomiting and diarrhea got worse throughout day

## 2012-08-25 NOTE — ED Notes (Signed)
MD at bedside. 

## 2012-08-26 ENCOUNTER — Emergency Department (HOSPITAL_BASED_OUTPATIENT_CLINIC_OR_DEPARTMENT_OTHER): Payer: 59

## 2012-08-26 LAB — CBC WITH DIFFERENTIAL/PLATELET
Eosinophils Absolute: 0 10*3/uL (ref 0.0–0.7)
Eosinophils Relative: 0 % (ref 0–5)
Hemoglobin: 12.6 g/dL (ref 12.0–15.0)
Lymphocytes Relative: 18 % (ref 12–46)
Lymphs Abs: 1.4 10*3/uL (ref 0.7–4.0)
MCH: 25.1 pg — ABNORMAL LOW (ref 26.0–34.0)
MCV: 76.3 fL — ABNORMAL LOW (ref 78.0–100.0)
Monocytes Relative: 5 % (ref 3–12)
RBC: 5.02 MIL/uL (ref 3.87–5.11)
WBC: 7.4 10*3/uL (ref 4.0–10.5)

## 2012-08-26 LAB — COMPREHENSIVE METABOLIC PANEL
ALT: 22 U/L (ref 0–35)
Alkaline Phosphatase: 70 U/L (ref 39–117)
BUN: 9 mg/dL (ref 6–23)
CO2: 27 mEq/L (ref 19–32)
Calcium: 10.1 mg/dL (ref 8.4–10.5)
GFR calc Af Amer: >90 mL/min (ref >90)
GFR calc non Af Amer: >90 mL/min (ref >90)
Glucose, Bld: 114 mg/dL — ABNORMAL HIGH (ref 70–99)
Potassium: 3.7 mEq/L (ref 3.5–5.1)
Total Protein: 8.4 g/dL — ABNORMAL HIGH (ref 6.0–8.3)

## 2012-08-26 LAB — LIPASE, BLOOD: Lipase: 22 U/L (ref 11–59)

## 2012-08-26 MED ORDER — IOHEXOL 300 MG/ML  SOLN
100.0000 mL | Freq: Once | INTRAMUSCULAR | Status: AC | PRN
  Administered 2012-08-26: 100 mL via INTRAVENOUS

## 2012-08-26 MED ORDER — ONDANSETRON HCL 4 MG/2ML IJ SOLN
4.0000 mg | Freq: Once | INTRAMUSCULAR | Status: AC
  Administered 2012-08-26: 4 mg via INTRAVENOUS

## 2012-08-26 MED ORDER — ONDANSETRON 8 MG PO TBDP: 8.0000 mg | ORAL_TABLET | Freq: Three times a day (TID) | ORAL | Status: DC | PRN

## 2012-08-26 MED ORDER — DIPHENOXYLATE-ATROPINE 2.5-0.025 MG PO TABS: 1.0000 | ORAL_TABLET | Freq: Four times a day (QID) | ORAL | Status: DC | PRN

## 2012-08-26 MED ORDER — ONDANSETRON HCL 4 MG/2ML IJ SOLN
INTRAMUSCULAR | Status: AC
  Administered 2012-08-26: 4 mg via INTRAVENOUS
  Filled 2012-08-26: qty 2

## 2012-08-26 NOTE — ED Notes (Signed)
Patient transported to CT 

## 2012-08-26 NOTE — ED Provider Notes (Addendum)
Nursing notes and vitals signs, including pulse oximetry, reviewed.  Summary of this visit's results, reviewed by myself:  Labs:  Results for orders placed during the hospital encounter of 08/25/12 (from the past 24 hour(s))  URINALYSIS, ROUTINE W REFLEX MICROSCOPIC     Status: Abnormal   Collection Time   08/25/12  9:35 PM      Component Value Range   Color, Urine YELLOW  YELLOW   APPearance CLOUDY (*) CLEAR   Specific Gravity, Urine 1.027  1.005 - 1.030   pH 6.0  5.0 - 8.0   Glucose, UA NEGATIVE  NEGATIVE mg/dL   Hgb urine dipstick MODERATE (*) NEGATIVE   Bilirubin Urine NEGATIVE  NEGATIVE   Ketones, ur NEGATIVE  NEGATIVE mg/dL   Protein, ur 30 (*) NEGATIVE mg/dL   Urobilinogen, UA 0.2  0.0 - 1.0 mg/dL   Nitrite NEGATIVE  NEGATIVE   Leukocytes, UA NEGATIVE  NEGATIVE  URINE MICROSCOPIC-ADD ON     Status: Abnormal   Collection Time   08/25/12  9:35 PM      Component Value Range   Squamous Epithelial / LPF RARE  RARE   RBC / HPF 7-10  <3 RBC/hpf   Bacteria, UA FEW (*) RARE   Urine-Other MUCOUS PRESENT    CBC WITH DIFFERENTIAL     Status: Abnormal   Collection Time   08/26/12 12:03 AM      Component Value Range   WBC 7.4  4.0 - 10.5 K/uL   RBC 5.02  3.87 - 5.11 MIL/uL   Hemoglobin 12.6  12.0 - 15.0 g/dL   HCT 16.1  09.6 - 04.5 %   MCV 76.3 (*) 78.0 - 100.0 fL   MCH 25.1 (*) 26.0 - 34.0 pg   MCHC 32.9  30.0 - 36.0 g/dL   RDW 40.9 (*) 81.1 - 91.4 %   Platelets 265  150 - 400 K/uL   Neutrophils Relative 76  43 - 77 %   Neutro Abs 5.7  1.7 - 7.7 K/uL   Lymphocytes Relative 18  12 - 46 %   Lymphs Abs 1.4  0.7 - 4.0 K/uL   Monocytes Relative 5  3 - 12 %   Monocytes Absolute 0.4  0.1 - 1.0 K/uL   Eosinophils Relative 0  0 - 5 %   Eosinophils Absolute 0.0  0.0 - 0.7 K/uL   Basophils Relative 0  0 - 1 %   Basophils Absolute 0.0  0.0 - 0.1 K/uL  COMPREHENSIVE METABOLIC PANEL     Status: Abnormal   Collection Time   08/26/12 12:03 AM      Component Value Range   Sodium 144  135 -  145 mEq/L   Potassium 3.7  3.5 - 5.1 mEq/L   Chloride 103  96 - 112 mEq/L   CO2 27  19 - 32 mEq/L   Glucose, Bld 114 (*) 70 - 99 mg/dL   BUN 9  6 - 23 mg/dL   Creatinine, Ser 7.82  0.50 - 1.10 mg/dL   Calcium 95.6  8.4 - 21.3 mg/dL   Total Protein 8.4 (*) 6.0 - 8.3 g/dL   Albumin 4.2  3.5 - 5.2 g/dL   AST 16  0 - 37 U/L   ALT 22  0 - 35 U/L   Alkaline Phosphatase 70  39 - 117 U/L   Total Bilirubin 0.3  0.3 - 1.2 mg/dL   GFR calc non Af Amer >90  >90 mL/min   GFR  calc Af Amer >90  >90 mL/min  LIPASE, BLOOD     Status: Normal   Collection Time   08/26/12 12:03 AM      Component Value Range   Lipase 22  11 - 59 U/L    Imaging Studies: Ct Abdomen Pelvis W Contrast  08/26/2012  *RADIOLOGY REPORT*  Clinical Data: Left-sided abdominal pain.  Nausea, vomiting.  The patient reports a history of vomiting after iodine administration. No complications were reported after contrast injection today except for nausea.  CT ABDOMEN AND PELVIS WITH CONTRAST  Technique:  Multidetector CT imaging of the abdomen and pelvis was performed following the standard protocol during bolus administration of intravenous contrast.  Contrast: OMNIPAQUE IOHEXOL 300 MG/ML  SOLN  Comparison: 07/04/2012  Findings: The lung bases are clear.  The liver, spleen, gallbladder, pancreas, adrenal glands, kidneys, abdominal aorta, and retroperitoneal lymph nodes are unremarkable.  The low attenuation lesions seen previously in the left lobe of the liver is not identified today.  The stomach and small bowel are not abnormally distended.  No definitive bowel wall thickening.  Stool filled mostly decompressed colon.  No free air or free fluid in the abdomen.  There is umbilical hernia containing fat and small bowel in the base of the hernia.  The appearance is similar to previous study. Scarring in the midline low abdominal / pelvic wall consistent with postoperative change.  Pelvis:  Surgical absence of the uterus.  The previously  demonstrated left adnexal cyst has either resolved or been resected in the interval.  There appears to have been partial sigmoidectomy with stable anastomoses.  No free or loculated pelvic fluid collections.  The bladder wall is not thickened.  No diverticulitis.  Appendix is normal.  No significant lymphadenopathy in the pelvis.  Fat in the inguinal canals.  IMPRESSION: Umbilical/periumbilical hernia containing fat with small bowel in the base of the hernia.  No evidence of obstruction.  Interval resection or resolution of previously identified left ovarian cyst. Otherwise, no significant changes.   Original Report Authenticated By: Burman Nieves, M.D.     1:39 AM Abdomen soft, nontender. Patient's symptoms were controlled this time. No palpable hernia at umbilicus.  Medical screening examination/treatment/procedure(s) were conducted as a shared visit with non-physician practitioner(s) and myself.  I personally evaluated the patient during the encounter    Hanley Seamen, MD 08/26/12 0140  Hanley Seamen, MD 08/26/12 [phone]

## 2012-10-28 ENCOUNTER — Emergency Department (HOSPITAL_BASED_OUTPATIENT_CLINIC_OR_DEPARTMENT_OTHER)
Admission: EM | Admit: 2012-10-28 | Discharge: 2012-10-28 | Disposition: A | Discharge status: Home / Self Care | Payer: 59 | Attending: Emergency Medicine | Admitting: Emergency Medicine

## 2012-10-28 ENCOUNTER — Encounter (HOSPITAL_BASED_OUTPATIENT_CLINIC_OR_DEPARTMENT_OTHER): Payer: Self-pay | Admitting: *Deleted

## 2012-10-28 DIAGNOSIS — Z8742 Personal history of other diseases of the female genital tract: Secondary | ICD-10-CM | POA: Insufficient documentation

## 2012-10-28 DIAGNOSIS — L0291 Cutaneous abscess, unspecified: Secondary | ICD-10-CM

## 2012-10-28 DIAGNOSIS — Z8719 Personal history of other diseases of the digestive system: Secondary | ICD-10-CM | POA: Insufficient documentation

## 2012-10-28 DIAGNOSIS — IMO0002 Reserved for concepts with insufficient information to code with codable children: Secondary | ICD-10-CM | POA: Insufficient documentation

## 2012-10-28 DIAGNOSIS — Z8679 Personal history of other diseases of the circulatory system: Secondary | ICD-10-CM | POA: Insufficient documentation

## 2012-10-28 DIAGNOSIS — Z79899 Other long term (current) drug therapy: Secondary | ICD-10-CM | POA: Insufficient documentation

## 2012-10-28 DIAGNOSIS — Z90710 Acquired absence of both cervix and uterus: Secondary | ICD-10-CM | POA: Insufficient documentation

## 2012-10-28 MED ORDER — OXYCODONE-ACETAMINOPHEN 5-325 MG PO TABS: 2.0000 | ORAL_TABLET | ORAL | Status: DC | PRN

## 2012-10-28 MED ORDER — LIDOCAINE HCL (PF) 1 % IJ SOLN
INTRAMUSCULAR | Status: AC
  Filled 2012-10-28: qty 5

## 2012-10-28 MED ORDER — SULFAMETHOXAZOLE-TRIMETHOPRIM 800-160 MG PO TABS: 1.0000 | ORAL_TABLET | Freq: Two times a day (BID) | ORAL | Status: DC

## 2012-10-28 NOTE — ED Notes (Signed)
Pt states noticed swelling under left axilla yesterday this am is painful no drainage

## 2012-10-28 NOTE — ED Provider Notes (Signed)
History     CSN: [id number]  Arrival date & time 10/28/12  4098   First MD Initiated Contact with Patient 10/28/12 [phone]      Chief Complaint  Patient presents with  . boil left axilla     (Consider location/radiation/quality/duration/timing/severity/associated sxs/prior treatment) HPI Comments: Patient presents with a two-day history of worsening swelling of her left armpit. She's had a history of abscesses under her right axilla. She denies a fevers or vomiting. She denies any drainage from the site. Her tetanus shot is up-to-date. She's had constant throbbing worsening pain to the area.   Past Medical History  Diagnosis Date  . Ectopic pregnancy   . Hx of hysterectomy partial for cervicl CA 2003  . Sigmoidoscopy exam partial sigmoid colectomy 07-23-08  . Migraine headache   . Diverticulitis   . Cyst, ovary, follicular     Past Surgical History  Procedure Laterality Date  . Partial hysterectomy  2003    Cervical Cancer  . Ectopic pregnancy surgery    . Partial sigmoid colectomy 07-23-08  07-23-08  . Oophorectomy    . Abdominal hysterectomy    . Foot surgery      History reviewed. No pertinent family history.  History  Substance Use Topics  . Smoking status: Never Smoker   . Smokeless tobacco: Not on file  . Alcohol Use: No    OB History   Grav Para Term Preterm Abortions TAB SAB Ect Mult Living                  Review of Systems  Constitutional: Negative for fever.  Gastrointestinal: Negative for nausea and vomiting.  Musculoskeletal: Negative for joint swelling and arthralgias.  Skin: Positive for wound.  Neurological: Negative for dizziness and headaches.    Allergies  Iodine and Ivp dye  Home Medications   Current Outpatient Rx  Name  Route  Sig  Dispense  Refill  . albuterol (PROVENTIL HFA;VENTOLIN HFA) 108 (90 BASE) MCG/ACT inhaler   Inhalation   Inhale 1-2 puffs into the lungs every 4 (four) hours as needed for wheezing.   1 Inhaler   0   .  budesonide-formoterol (SYMBICORT) 160-4.5 MCG/ACT inhaler   Inhalation   Inhale 2 puffs into the lungs 2 (two) times daily.         . diphenoxylate-atropine (LOMOTIL) 2.5-0.025 MG per tablet   Oral   Take 1-2 tablets by mouth 4 (four) times daily as needed for diarrhea or loose stools.   16 tablet   0   . EXPIRED: metoCLOPramide (REGLAN) 10 MG tablet   Oral   Take 1 tablet (10 mg total) by mouth every 6 (six) hours.   30 tablet   0   . metroNIDAZOLE (FLAGYL) 500 MG tablet   Oral   Take 1 tablet (500 mg total) by mouth 2 (two) times daily. One po bid x 7 days   14 tablet   0   . ondansetron (ZOFRAN ODT) 8 MG disintegrating tablet   Oral   Take 1 tablet (8 mg total) by mouth every 8 (eight) hours as needed for nausea.   5 tablet   0   . oxyCODONE-acetaminophen (PERCOCET) 5-325 MG per tablet   Oral   Take 2 tablets by mouth every 4 (four) hours as needed for pain.   20 tablet   0   . sulfamethoxazole-trimethoprim (SEPTRA DS) 800-160 MG per tablet   Oral   Take 1 tablet by mouth every 12 (twelve)  hours.   20 tablet   0     BP 119/79  Pulse 78  Temp(Src) 98.1 F (36.7 C) (Oral)  SpO2 99%  Physical Exam  Constitutional: She is oriented to person, place, and time. She appears well-developed and well-nourished.  Cardiovascular: Normal rate.   Pulmonary/Chest: Effort normal.  Musculoskeletal:  There is a 2 cm fluctuant abscess with no surrounding erythema or to the left axilla. No drainage is noted.  Neurological: She is alert and oriented to person, place, and time.  Skin: Skin is warm and dry.    ED Course  INCISION AND DRAINAGE Date/Time: 10/28/2012 7:32 AM Performed by: Cyruss Arata Authorized by: Rolan Bucco Consent: Verbal consent obtained. Risks and benefits: risks, benefits and alternatives were discussed Consent given by: patient Patient understanding: patient states understanding of the procedure being performed Patient consent: the patient's  understanding of the procedure matches consent given Patient identity confirmed: verbally with patient Type: abscess Body area: upper extremity Location details: left arm Anesthesia: local infiltration Local anesthetic: lidocaine 1% without epinephrine Anesthetic total: 2 ml Scalpel size: 11 Incision type: single straight Complexity: simple Drainage: purulent Drainage amount: scant Wound treatment: wound left open Patient tolerance: Patient tolerated the procedure well with no immediate complications.   (including critical care time)  Labs Reviewed - No data to display No results found.   1. Abscess       MDM  Abscess was opened with I&D procedure.  Pt advised to start warm compresses.  Started on bactrim and percocet.       Rolan Bucco, MD 10/28/12 [phone]

## 2012-12-24 ENCOUNTER — Encounter (HOSPITAL_BASED_OUTPATIENT_CLINIC_OR_DEPARTMENT_OTHER): Payer: Self-pay

## 2012-12-24 ENCOUNTER — Emergency Department (HOSPITAL_BASED_OUTPATIENT_CLINIC_OR_DEPARTMENT_OTHER)
Admission: EM | Admit: 2012-12-24 | Discharge: 2012-12-24 | Disposition: A | Discharge status: Home / Self Care | Payer: 59 | Attending: Emergency Medicine | Admitting: Emergency Medicine

## 2012-12-24 DIAGNOSIS — Z8742 Personal history of other diseases of the female genital tract: Secondary | ICD-10-CM | POA: Insufficient documentation

## 2012-12-24 DIAGNOSIS — Z8619 Personal history of other infectious and parasitic diseases: Secondary | ICD-10-CM | POA: Insufficient documentation

## 2012-12-24 DIAGNOSIS — Z8719 Personal history of other diseases of the digestive system: Secondary | ICD-10-CM | POA: Insufficient documentation

## 2012-12-24 DIAGNOSIS — Z79899 Other long term (current) drug therapy: Secondary | ICD-10-CM | POA: Insufficient documentation

## 2012-12-24 DIAGNOSIS — T7840XA Allergy, unspecified, initial encounter: Secondary | ICD-10-CM

## 2012-12-24 DIAGNOSIS — Z8679 Personal history of other diseases of the circulatory system: Secondary | ICD-10-CM | POA: Insufficient documentation

## 2012-12-24 DIAGNOSIS — R21 Rash and other nonspecific skin eruption: Secondary | ICD-10-CM | POA: Insufficient documentation

## 2012-12-24 HISTORY — DX: Other specified bacterial intestinal infections: A04.8

## 2012-12-24 MED ORDER — DIPHENHYDRAMINE HCL 25 MG PO CAPS
25.0000 mg | ORAL_CAPSULE | Freq: Once | ORAL | Status: AC
  Administered 2012-12-24: 25 mg via ORAL
  Filled 2012-12-24: qty 1

## 2012-12-24 MED ORDER — DIPHENHYDRAMINE HCL 25 MG PO TABS: 25.0000 mg | ORAL_TABLET | Freq: Four times a day (QID) | ORAL | Status: DC

## 2012-12-24 NOTE — ED Notes (Signed)
C/o sudden onset of sweating and rash to face x 1 hour-started while driving-rash noted to face-NAD

## 2012-12-24 NOTE — ED Notes (Signed)
MD at bedside. 

## 2012-12-24 NOTE — ED Provider Notes (Signed)
History  This chart was scribed for Meredith Octave, MD by Bennett Scrape, ED Scribe. This patient was seen in room MH11/[facility]11 and the patient's care was started at 8:12 PM.  CSN: [id number]  Arrival date & time 12/24/12  1953   First MD Initiated Contact with Patient 12/24/12 2012      Chief Complaint  Patient presents with  . Rash    The history is provided by the patient. No language interpreter was used.   HPI Comments: Meredith Barnes is a 37 y.o. female who presents to the Emergency Department complaining of sudden onset, non-changing, constant rash described as itching located on the face, worse on the right, with associated diaphoresis that started while driving one hour ago. Pt states that she had been in her house "all day" prior to this happening. She denies any changes in hygiene products or detergents. She denies any recent medication changes and states that she is not currently on any daily medications. She also denies wearing makeup. She denies taking OTC medications at home to improve symptoms and denies having prior episodes of similar symptoms. She denies SOB, trouble swallowing, sore throat, CP, fever, dental pain and rhinorrhea as associated symptoms.   H. Pylori infection noted on endoscopy and colonoscopy performed 5 days ago. She states that she has an antibiotic prescription but has not been to pick it up yet.   Past Medical History  Diagnosis Date  . Ectopic pregnancy   . Hx of hysterectomy partial for cervicl CA 2003  . Sigmoidoscopy exam partial sigmoid colectomy 07-23-08  . Migraine headache   . Diverticulitis   . Cyst, ovary, follicular   . H. pylori infection     Past Surgical History  Procedure Laterality Date  . Partial hysterectomy  2003    Cervical Cancer  . Ectopic pregnancy surgery    . Partial sigmoid colectomy 07-23-08  07-23-08  . Oophorectomy    . Abdominal hysterectomy    . Foot surgery      No family history on file.  History  Substance  Use Topics  . Smoking status: Never Smoker   . Smokeless tobacco: Not on file  . Alcohol Use: No    No OB history provided.  Review of Systems  A complete 10 system review of systems was obtained and all systems are negative except as noted in the HPI and PMH.   Allergies  Iodine; Ivp dye; and Tape  Home Medications   Current Outpatient Rx  Name  Route  Sig  Dispense  Refill  . albuterol (PROVENTIL HFA;VENTOLIN HFA) 108 (90 BASE) MCG/ACT inhaler   Inhalation   Inhale 1-2 puffs into the lungs every 4 (four) hours as needed for wheezing.   1 Inhaler   0   . budesonide-formoterol (SYMBICORT) 160-4.5 MCG/ACT inhaler   Inhalation   Inhale 2 puffs into the lungs 2 (two) times daily.         . diphenoxylate-atropine (LOMOTIL) 2.5-0.025 MG per tablet   Oral   Take 1-2 tablets by mouth 4 (four) times daily as needed for diarrhea or loose stools.   16 tablet   0   . EXPIRED: metoCLOPramide (REGLAN) 10 MG tablet   Oral   Take 1 tablet (10 mg total) by mouth every 6 (six) hours.   30 tablet   0   . metroNIDAZOLE (FLAGYL) 500 MG tablet   Oral   Take 1 tablet (500 mg total) by mouth 2 (two) times daily. One  po bid x 7 days   14 tablet   0   . ondansetron (ZOFRAN ODT) 8 MG disintegrating tablet   Oral   Take 1 tablet (8 mg total) by mouth every 8 (eight) hours as needed for nausea.   5 tablet   0   . oxyCODONE-acetaminophen (PERCOCET) 5-325 MG per tablet   Oral   Take 2 tablets by mouth every 4 (four) hours as needed for pain.   20 tablet   0   . sulfamethoxazole-trimethoprim (SEPTRA DS) 800-160 MG per tablet   Oral   Take 1 tablet by mouth every 12 (twelve) hours.   20 tablet   0     Triage Vitals: BP 135/98  Pulse 78  Temp(Src) 98.3 F (36.8 C) (Oral)  Resp 16  Ht 5\' 5"  (1.651 m)  Wt 231 lb (104.781 kg)  BMI 38.44 kg/m2  SpO2 100%  Physical Exam  Nursing note and vitals reviewed. Constitutional: She is oriented to person, place, and time. She  appears well-developed and well-nourished. No distress.  HENT:  Head: Normocephalic and atraumatic.  Mouth/Throat: Oropharynx is clear and moist.  Mild erythema to bilateral cheeks No swelling of oropharynx  Eyes: Conjunctivae and EOM are normal. Pupils are equal, round, and reactive to light.  Neck: Neck supple. No tracheal deviation present.  Cardiovascular: Normal rate and regular rhythm.   Pulmonary/Chest: Effort normal and breath sounds normal. No respiratory distress. She has no wheezes.  Abdominal: Soft. There is no tenderness.  Musculoskeletal: Normal range of motion. She exhibits no edema.  Neurological: She is alert and oriented to person, place, and time.  Skin: Skin is warm and dry.  Psychiatric: She has a normal mood and affect. Her behavior is normal.    ED Course  Procedures (including critical care time)  Medications  diphenhydrAMINE (BENADRYL) capsule 25 mg (not administered)    DIAGNOSTIC STUDIES: Oxygen Saturation is 100% on room air, normal by my interpretation.    COORDINATION OF CARE: 8:25 PM-Discussed treatment plan which includes benadryl with pt at bedside and pt agreed to plan.   9:20 PM-Pt rechecked and feels improved with medications listed above. Upon re-exam, the erythema has improved as well. Pt denies throat tightness and difficulty swallowing. Discussed discharge plan which includes benadryl every 4 hours with pt and pt agreed to plan. Also advised pt to follow up as needed and pt agreed. Addressed symptoms to return for with pt.   Labs Reviewed - No data to display No results found.   No diagnosis found.    MDM  One hour of itching and redness to the bilateral cheeks. No shortness of breath, wheezing, cough. No trouble breathing no throat tightness. Denies any new exposures. No new foods or medicines. No new cosmetics.  Patient treated with antihistamines in the ED. No worsening of her condition. No difficulty breathing, no cough no  wheezing or no throat swelling.  We'll treat at home with antihistamines. Return precautions discussed including worsening swelling, chest pain, shortness of breath, throat swelling or any other concerns.    I personally performed the services described in this documentation, which was scribed in my presence. The recorded information has been reviewed and is accurate.   Meredith Octave, MD 12/24/12 2131

## 2013-07-24 ENCOUNTER — Encounter (HOSPITAL_BASED_OUTPATIENT_CLINIC_OR_DEPARTMENT_OTHER): Payer: Self-pay | Admitting: Emergency Medicine

## 2013-07-24 ENCOUNTER — Emergency Department (HOSPITAL_BASED_OUTPATIENT_CLINIC_OR_DEPARTMENT_OTHER)
Admission: EM | Admit: 2013-07-24 | Discharge: 2013-07-24 | Disposition: A | Discharge status: Home / Self Care | Payer: 59 | Attending: Emergency Medicine | Admitting: Emergency Medicine

## 2013-07-24 DIAGNOSIS — Z9889 Other specified postprocedural states: Secondary | ICD-10-CM | POA: Insufficient documentation

## 2013-07-24 DIAGNOSIS — L0390 Cellulitis, unspecified: Secondary | ICD-10-CM

## 2013-07-24 DIAGNOSIS — IMO0002 Reserved for concepts with insufficient information to code with codable children: Secondary | ICD-10-CM | POA: Insufficient documentation

## 2013-07-24 DIAGNOSIS — Z8619 Personal history of other infectious and parasitic diseases: Secondary | ICD-10-CM | POA: Insufficient documentation

## 2013-07-24 DIAGNOSIS — G43909 Migraine, unspecified, not intractable, without status migrainosus: Secondary | ICD-10-CM | POA: Insufficient documentation

## 2013-07-24 DIAGNOSIS — L0291 Cutaneous abscess, unspecified: Secondary | ICD-10-CM

## 2013-07-24 DIAGNOSIS — Z8742 Personal history of other diseases of the female genital tract: Secondary | ICD-10-CM | POA: Insufficient documentation

## 2013-07-24 DIAGNOSIS — Z8719 Personal history of other diseases of the digestive system: Secondary | ICD-10-CM | POA: Insufficient documentation

## 2013-07-24 DIAGNOSIS — Z90710 Acquired absence of both cervix and uterus: Secondary | ICD-10-CM | POA: Insufficient documentation

## 2013-07-24 DIAGNOSIS — Z79899 Other long term (current) drug therapy: Secondary | ICD-10-CM | POA: Insufficient documentation

## 2013-07-24 MED ORDER — SULFAMETHOXAZOLE-TRIMETHOPRIM 800-160 MG PO TABS: 1.0000 | ORAL_TABLET | Freq: Two times a day (BID) | ORAL | Status: DC

## 2013-07-24 MED ORDER — HYDROCODONE-ACETAMINOPHEN 5-325 MG PO TABS: 1.0000 | ORAL_TABLET | ORAL | Status: DC | PRN

## 2013-07-24 MED ORDER — LIDOCAINE-EPINEPHRINE 2 %-1:100000 IJ SOLN
20.0000 mL | Freq: Once | INTRAMUSCULAR | Status: AC
  Administered 2013-07-24: 20 mL via INTRADERMAL

## 2013-07-24 NOTE — ED Notes (Signed)
Abscess to her left axilla x 1 week.

## 2013-07-24 NOTE — Discharge Instructions (Signed)
Abscess °An abscess is an infected area that contains a collection of pus and debris. It can occur in almost any part of the body. An abscess is also known as a furuncle or boil. °CAUSES  °An abscess occurs when tissue gets infected. This can occur from blockage of oil or sweat glands, infection of hair follicles, or a minor injury to the skin. As the body tries to fight the infection, pus collects in the area and creates pressure under the skin. This pressure causes pain. People with weakened immune systems have difficulty fighting infections and get certain abscesses more often.  °SYMPTOMS °Usually an abscess develops on the skin and becomes a painful mass that is red, warm, and tender. If the abscess forms under the skin, you may feel a moveable soft area under the skin. Some abscesses break open (rupture) on their own, but most will continue to get worse without care. The infection can spread deeper into the body and eventually into the bloodstream, causing you to feel ill.  °DIAGNOSIS  °Your caregiver will take your medical history and perform a physical exam. A sample of fluid may also be taken from the abscess to determine what is causing your infection. °TREATMENT  °Your caregiver may prescribe antibiotic medicines to fight the infection. However, taking antibiotics alone usually does not cure an abscess. Your caregiver may need to make a small cut (incision) in the abscess to drain the pus. In some cases, gauze is packed into the abscess to reduce pain and to continue draining the area. °HOME CARE INSTRUCTIONS  °· Only take over-the-counter or prescription medicines for pain, discomfort, or fever as directed by your caregiver. °· If you were prescribed antibiotics, take them as directed. Finish them even if you start to feel better. °· If gauze is used, follow your caregiver's directions for changing the gauze. °· To avoid spreading the infection: °· Keep your draining abscess covered with a  bandage. °· Wash your hands well. °· Do not share personal care items, towels, or whirlpools with others. °· Avoid skin contact with others. °· Keep your skin and clothes clean around the abscess. °· Keep all follow-up appointments as directed by your caregiver. °SEEK MEDICAL CARE IF:  °· You have increased pain, swelling, redness, fluid drainage, or bleeding. °· You have muscle aches, chills, or a general ill feeling. °· You have a fever. °MAKE SURE YOU:  °· Understand these instructions. °· Will watch your condition. °· Will get help right away if you are not doing well or get worse. °Document Released: 04/18/2005 Document Revised: 01/08/2012 Document Reviewed: 09/21/2011 °ExitCare® Patient Information ©2014 [employer]. ° °Abscess °Care After °An abscess (also called a boil or furuncle) is an infected area that contains a collection of pus. Signs and symptoms of an abscess include pain, tenderness, redness, or hardness, or you may feel a moveable soft area under your skin. An abscess can occur anywhere in the body. The infection may spread to surrounding tissues causing cellulitis. A cut (incision) by the surgeon was made over your abscess and the pus was drained out. Gauze may have been packed into the space to provide a drain that will allow the cavity to heal from the inside outwards. The boil may be painful for 5 to 7 days. Most people with a boil do not have high fevers. Your abscess, if seen early, may not have localized, and may not have been lanced. If not, another appointment may be required for this if it does   not get better on its own or with medications. HOME CARE INSTRUCTIONS   Only take over-the-counter or prescription medicines for pain, discomfort, or fever as directed by your caregiver.  When you bathe, soak and then remove gauze or iodoform packs at least daily or as directed by your caregiver. You may then wash the wound gently with mild soapy water. Repack with gauze or do as your  caregiver directs. SEEK IMMEDIATE MEDICAL CARE IF:   You develop increased pain, swelling, redness, drainage, or bleeding in the wound site.  You develop signs of generalized infection including muscle aches, chills, fever, or a general ill feeling.  An oral temperature above 102 F (38.9 C) develops, not controlled by medication. See your caregiver for a recheck if you develop any of the symptoms described above. If medications (antibiotics) were prescribed, take them as directed. Document Released: 01/25/2005 Document Revised: 10/01/2011 Document Reviewed: 09/22/2007 [facility] Patient Information 2014 [place], [place].  Cellulitis Cellulitis is an infection of the skin and the tissue beneath it. The infected area is usually red and tender. Cellulitis occurs most often in the arms and lower legs.  CAUSES  Cellulitis is caused by bacteria that enter the skin through cracks or cuts in the skin. The most common types of bacteria that cause cellulitis are Staphylococcus and Streptococcus. SYMPTOMS   Redness and warmth.  Swelling.  Tenderness or pain.  Fever. DIAGNOSIS  Your caregiver can usually determine what is wrong based on a physical exam. Blood tests may also be done. TREATMENT  Treatment usually involves taking an antibiotic medicine. HOME CARE INSTRUCTIONS   Take your antibiotics as directed. Finish them even if you start to feel better.  Keep the infected arm or leg elevated to reduce swelling.  Apply a warm cloth to the affected area up to 4 times per day to relieve pain.  Only take over-the-counter or prescription medicines for pain, discomfort, or fever as directed by your caregiver.  Keep all follow-up appointments as directed by your caregiver. SEEK MEDICAL CARE IF:   You notice red streaks coming from the infected area.  Your red area gets larger or turns dark in color.  Your bone or joint underneath the infected area becomes painful after the skin has  healed.  Your infection returns in the same area or another area.  You notice a swollen bump in the infected area.  You develop new symptoms. SEEK IMMEDIATE MEDICAL CARE IF:   You have a fever.  You feel very sleepy.  You develop vomiting or diarrhea.  You have a general ill feeling (malaise) with muscle aches and pains. MAKE SURE YOU:   Understand these instructions.  Will watch your condition.  Will get help right away if you are not doing well or get worse. Document Released: 04/18/2005 Document Revised: 01/08/2012 Document Reviewed: 09/24/2011 [facility] Patient Information 2014 [place], [place].   RESOURCE GUIDE  Chronic Pain Problems: Contact [facility]  [phone] Patients need to be referred by their primary care doctor.  Insufficient Money for Medicine: Contact [employer]:  call [phone]  No Primary Care Doctor: - Call [facility]  [phone] - can help you locate a primary care doctor that  accepts your insurance, provides certain services, etc. - Physician Referral Service- [phone]  Agencies that provide inexpensive medical care: - [facility]  [phone] - [facility]  [phone] - [facility]  [phone] - Women's Clinic  [phone] - Planned Parenthood  [phone] [facility]  [phone]  Medicaid-accepting [facility] Providers: - [facility]- [address], [address]  [phone], Mon-Fri 9am-7pm, Sat 9am-1pm - [facility]- [address], [place]  [phone] - [facility]- [address], [address] [place]  [phone] [facility]- [address] [place] [address]  [phone] - Renaye RakersVeita Bland- [address], [address], [phone]  Only accepts [place] patients after they have their name  applied to their card  Self Pay (no insurance) in [place]: - Sickle Cell Patients - [facility]  [address], [phone] - [facility]- [address]  [phone]       [facility]-     [facility] [place]- [address], [address]       -     [facility]- see information above (Speak to Cit[employer]Pam H if you do not have insurance)       -  [facility] [place]- [address],  [phone]       -  [facility]- [address] [place] [address], [phone]       -  Dr Julio Sickssei-Bonsu-  [address], [address], [place], [phone]       -  [facility] - [address], [phone]       -  [facility]- [address] [place] [address], [phone], also [address], [phone]       -     [facility]- [address], [phone], 1st & 3rd Saturday         every month, 10am-1pm  -     [facility]   [address], [place].   Phone:  [phone], Fax:  [phone]. Hours of Operation:  9 am - 6 pm, M-F.  -     [facility]   [address], [address],    Phone: [phone], Fax: [phone]. Hours of Operation:  8:30 am - 5:30 pm, M-F.    Dental Assistance If unable to pay or uninsured, contact:  [facility] to become qualified for the adult dental clinic.  Patients with Medicaid: [facility] [phone], [phone] [address], [phone]  If unable to pay, or uninsured, contact [facility] [phone] in [place], [phone] in [facility]) to become qualified for the adult dental clinic  [facility] [address] [place], [place] [address] [phone] [email or web address]  Other [facility] Community Dental Services: - [facility]- [address], [place], [place], [address], [phone], [phone], 2nd and 4th Thursday of the month at 6:30am.  10 clients each day by appointment, can sometimes see walk-in patients if someone does not show for an  appointment. [facility]- [address], [place], [place], [address], [phone] - [facility]- [address], [place], [place], [address], [phone] - [facility]- [phone] [facility]- [facility]- [phone] [facility]- [facility]- [phone]

## 2013-07-24 NOTE — ED Provider Notes (Signed)
TIME SEEN: 10:12 PM  CHIEF COMPLAINT: Abscess  HPI:  Kinzey C Guinea-Bissau is a 38 y.o. female who presents to the Emergency Department complaining of a worsening abscess to her left axilla onset one week ago. She has a h/o abscess to her groin and axillas. She denies drainage from the area, fevers, chills, nausea, vomiting, diarrhea. She is otherwise healthy. She has no allergies to medications.    ROS: See HPI Constitutional: no fever  Eyes: no drainage  ENT: no runny nose   Cardiovascular:  no chest pain  Resp: no SOB  GI: no vomiting GU: no dysuria Integumentary: no rash  Allergy: no hives  Musculoskeletal: no leg swelling  Neurological: no slurred speech ROS otherwise negative  PAST MEDICAL HISTORY/PAST SURGICAL HISTORY:  Past Medical History  Diagnosis Date  . Ectopic pregnancy   . Hx of hysterectomy partial for cervicl CA 2003  . Sigmoidoscopy exam partial sigmoid colectomy 07-23-08  . Migraine headache   . Diverticulitis   . Cyst, ovary, follicular   . H. pylori infection     MEDICATIONS:  Prior to Admission medications   Medication Sig Start Date End Date Taking? Authorizing Provider  albuterol (PROVENTIL HFA;VENTOLIN HFA) 108 (90 BASE) MCG/ACT inhaler Inhale 1-2 puffs into the lungs every 4 (four) hours as needed for wheezing. 07/26/12   Richardean Canal, MD  budesonide-formoterol [facility]) 160-4.5 MCG/ACT inhaler Inhale 2 puffs into the lungs 2 (two) times daily.    Historical Provider, MD  diphenhydrAMINE (BENADRYL) 25 MG tablet Take 1 tablet (25 mg total) by mouth every 6 (six) hours. 12/24/12   [name], MD  diphenoxylate-atropine (LOMOTIL) 2.5-0.025 MG per tablet Take 1-2 tablets by mouth 4 (four) times daily as needed for diarrhea or loose stools. 08/26/12   Carlisle Beers Molpus, MD  metoCLOPramide (REGLAN) 10 MG tablet Take 1 tablet (10 mg total) by mouth every 6 (six) hours. 12/11/11 12/21/11  Loren Racer, MD  metroNIDAZOLE (FLAGYL) 500 MG tablet Take 1 tablet (500 mg  total) by mouth 2 (two) times daily. One po bid x 7 days 07/04/12   Geoffery Lyons, MD  ondansetron (ZOFRAN ODT) 8 MG disintegrating tablet Take 1 tablet (8 mg total) by mouth every 8 (eight) hours as needed for nausea. 08/26/12   Carlisle Beers Molpus, MD  oxyCODONE-acetaminophen (PERCOCET) 5-325 MG per tablet Take 2 tablets by mouth every 4 (four) hours as needed for pain. 10/28/12   Rolan Bucco, MD  sulfamethoxazole-trimethoprim (SEPTRA DS) 800-160 MG per tablet Take 1 tablet by mouth every 12 (twelve) hours. 10/28/12   Rolan Bucco, MD    ALLERGIES:  Allergies  Allergen Reactions  . Iodine Nausea And Vomiting    Pt states she is not allergic to iodine.  Berle Mull Dye [Iodinated Diagnostic Agents] Nausea And Vomiting  . Tape     Paper tape-rips skin    SOCIAL HISTORY:  History  Substance Use Topics  . Smoking status: Never Smoker   . Smokeless tobacco: Not on file  . Alcohol Use: No    FAMILY HISTORY: No family history on file.  EXAM: BP 133/80  Pulse 79  Temp(Src) 98.3 F (36.8 C) (Oral)  Resp 20  Ht 5\' 5"  (1.651 m)  Wt 231 lb (104.781 kg)  BMI 38.44 kg/m2  SpO2 100% CONSTITUTIONAL: Alert and oriented and responds appropriately to questions. Well-appearing; well-nourished; GCS 15 HEAD: Normocephalic; atraumatic EYES: Conjunctivae clear, PERRL, EOMI ENT: normal nose; no rhinorrhea; moist mucous membranes; pharynx without lesions noted; no  dental injury; no hemotypanum; no septal hematoma NECK: Supple, no meningismus, no LAD; no midline spinal tenderness, step-off or deformity CARD: RRR; S1 and S2 appreciated; no murmurs, no clicks, no rubs, no gallops RESP: Normal chest excursion without splinting or tachypnea; breath sounds clear and equal bilaterally; no wheezes, no rhonchi, no rales; chest wall stable, nontender to palpation ABD/GI: Normal bowel sounds; non-distended; soft, non-tender, no rebound, no guarding PELVIS:  stable, nontender to palpation BACK:  The back appears normal  and is non-tender to palpation, there is no CVA tenderness; no midline spinal tenderness, step-off or deformity EXT: Normal ROM in all joints; non-tender to palpation; no edema; normal capillary refill; no cyanosis    SKIN: Normal color for age and race; warm. Left axilla 4x2 cm fluctuant area with surrounding induration but no warmth or redness. No drainage, no adenopathy. NEURO: Moves all extremities equally PSYCH: The patient's mood and manner are appropriate. Grooming and personal hygiene are appropriate.  MEDICAL DECISION MAKING: Plan is to I&D patient's abscess and discharged home on antibiotics. Given return precautions, PCP followup. Patient verbalizes understanding is comfortable plan.    INCISION AND DRAINAGE Performed by: Raelyn NumberWARD, Magdalynn Davilla N Consent: Verbal consent obtained. Risks and benefits: risks, benefits and alternatives were discussed Type: abscess  Body area: Left axilla  Anesthesia: local infiltration  Incision was made with a scalpel.  Local anesthetic: lidocaine 2 % with epinephrine  Anesthetic total: 8 ml  Complexity: complex Blunt dissection to break up loculations  Drainage: purulent  Drainage amount: 10 mL  Packing material: 1/4 in iodoform gauze  Patient tolerance: Patient tolerated the procedure well with no immediate complications.     Layla MawKristen N Oliviana Mcgahee, DO 07/24/13 2301

## 2013-11-03 ENCOUNTER — Encounter (HOSPITAL_BASED_OUTPATIENT_CLINIC_OR_DEPARTMENT_OTHER): Payer: Self-pay | Admitting: Emergency Medicine

## 2013-11-03 ENCOUNTER — Emergency Department (HOSPITAL_BASED_OUTPATIENT_CLINIC_OR_DEPARTMENT_OTHER)
Admission: EM | Admit: 2013-11-03 | Discharge: 2013-11-03 | Disposition: A | Discharge status: Home / Self Care | Payer: 59 | Attending: Emergency Medicine | Admitting: Emergency Medicine

## 2013-11-03 DIAGNOSIS — Z8719 Personal history of other diseases of the digestive system: Secondary | ICD-10-CM | POA: Insufficient documentation

## 2013-11-03 DIAGNOSIS — Z79899 Other long term (current) drug therapy: Secondary | ICD-10-CM | POA: Insufficient documentation

## 2013-11-03 DIAGNOSIS — Z8679 Personal history of other diseases of the circulatory system: Secondary | ICD-10-CM | POA: Insufficient documentation

## 2013-11-03 DIAGNOSIS — IMO0002 Reserved for concepts with insufficient information to code with codable children: Secondary | ICD-10-CM | POA: Insufficient documentation

## 2013-11-03 DIAGNOSIS — Z8619 Personal history of other infectious and parasitic diseases: Secondary | ICD-10-CM | POA: Insufficient documentation

## 2013-11-03 DIAGNOSIS — Z8742 Personal history of other diseases of the female genital tract: Secondary | ICD-10-CM | POA: Insufficient documentation

## 2013-11-03 DIAGNOSIS — L0291 Cutaneous abscess, unspecified: Secondary | ICD-10-CM

## 2013-11-03 MED ORDER — HYDROCODONE-ACETAMINOPHEN 5-325 MG PO TABS: 1.0000 | ORAL_TABLET | ORAL | Status: DC | PRN

## 2013-11-03 MED ORDER — SULFAMETHOXAZOLE-TRIMETHOPRIM 800-160 MG PO TABS: 1.0000 | ORAL_TABLET | Freq: Two times a day (BID) | ORAL | Status: DC

## 2013-11-03 MED ORDER — HYDROCODONE-ACETAMINOPHEN 5-325 MG PO TABS
2.0000 | ORAL_TABLET | Freq: Once | ORAL | Status: AC
  Administered 2013-11-03: 2 via ORAL
  Filled 2013-11-03: qty 2

## 2013-11-03 NOTE — ED Notes (Signed)
Pt reports having boils/abscesses on left armpit.  Sts she has had numerous between both sides.  Sts it's not usually as bad as this. Sts applying heat with no relief.

## 2013-11-03 NOTE — ED Provider Notes (Signed)
CSN: [id number]     Arrival date & time 11/03/13  1831 History   First MD Initiated Contact with Patient 11/03/13 2004     Chief Complaint  Patient presents with  . Abscess     (Consider location/radiation/quality/duration/timing/severity/associated sxs/prior Treatment) Patient is a 38 y.o. female presenting with abscess. The history is provided by the patient. No language interpreter was used.  Abscess Location:  Shoulder/arm Shoulder/arm abscess location:  L axilla Size:  2 Abscess quality: draining and warmth   Red streaking: no   Progression:  Worsening Chronicity:  New Relieved by:  Nothing Worsened by:  Nothing tried Ineffective treatments:  None tried Pt complains that she gets multiple abscesses  Pt complains of swelling to left under arm  Past Medical History  Diagnosis Date  . Ectopic pregnancy   . Hx of hysterectomy partial for cervicl CA 2003  . Sigmoidoscopy exam partial sigmoid colectomy 07-23-08  . Migraine headache   . Diverticulitis   . Cyst, ovary, follicular   . H. pylori infection    Past Surgical History  Procedure Laterality Date  . Partial hysterectomy  2003    Cervical Cancer  . Ectopic pregnancy surgery    . Partial sigmoid colectomy 07-23-08  07-23-08  . Oophorectomy    . Abdominal hysterectomy    . Foot surgery     No family history on file. History  Substance Use Topics  . Smoking status: Never Smoker   . Smokeless tobacco: Not on file  . Alcohol Use: No   OB History   Grav Para Term Preterm Abortions TAB SAB Ect Mult Living                 Review of Systems  Skin: Positive for wound.  All other systems reviewed and are negative.     Allergies  Iodine; Ivp dye; and Tape  Home Medications   Prior to Admission medications   Medication Sig Start Date End Date Taking? Authorizing Provider  albuterol (PROVENTIL HFA;VENTOLIN HFA) 108 (90 BASE) MCG/ACT inhaler Inhale 1-2 puffs into the lungs every 4 (four) hours as needed for  wheezing. 07/26/12   Richardean Canalavid H Yao, MD  budesonide-formoterol [facility](SYMBICORT) 160-4.5 MCG/ACT inhaler Inhale 2 puffs into the lungs 2 (two) times daily.    Historical Provider, MD  diphenhydrAMINE (BENADRYL) 25 MG tablet Take 1 tablet (25 mg total) by mouth every 6 (six) hours. 12/24/12   Glynn OctaveStephen Rancour, MD  diphenoxylate-atropine (LOMOTIL) 2.5-0.025 MG per tablet Take 1-2 tablets by mouth 4 (four) times daily as needed for diarrhea or loose stools. 08/26/12   Carlisle BeersJohn L Molpus, MD  HYDROcodone-acetaminophen (NORCO/VICODIN) 5-325 MG per tablet Take 1 tablet by mouth every 4 (four) hours as needed. 07/24/13   Kristen N Ward, DO  metoCLOPramide (REGLAN) 10 MG tablet Take 1 tablet (10 mg total) by mouth every 6 (six) hours. 12/11/11 12/21/11  Loren Raceravid Yelverton, MD  metroNIDAZOLE (FLAGYL) 500 MG tablet Take 1 tablet (500 mg total) by mouth 2 (two) times daily. One po bid x 7 days 07/04/12   Geoffery Lyonsouglas Delo, MD  ondansetron (ZOFRAN ODT) 8 MG disintegrating tablet Take 1 tablet (8 mg total) by mouth every 8 (eight) hours as needed for nausea. 08/26/12   Carlisle BeersJohn L Molpus, MD  oxyCODONE-acetaminophen (PERCOCET) 5-325 MG per tablet Take 2 tablets by mouth every 4 (four) hours as needed for pain. 10/28/12   Rolan BuccoMelanie Belfi, MD  sulfamethoxazole-trimethoprim (SEPTRA DS) 800-160 MG per tablet Take 1 tablet by mouth every 12 (  twelve) hours. 10/28/12   Rolan BuccoMelanie Belfi, MD  sulfamethoxazole-trimethoprim (SEPTRA DS) 800-160 MG per tablet Take 1 tablet by mouth 2 (two) times daily. 07/24/13   Kristen N Ward, DO   BP 137/92  Pulse 76  Temp(Src) 98.4 F (36.9 C) (Oral)  Resp 18  Ht 5\' 5"  (1.651 m)  Wt 240 lb (108.863 kg)  BMI 39.94 kg/m2  SpO2 100% Physical Exam  Nursing note and vitals reviewed. Constitutional: She is oriented to person, place, and time. She appears well-developed and well-nourished.  HENT:  Head: Normocephalic.  Eyes: EOM are normal. Pupils are equal, round, and reactive to light.  Neck: Normal range of motion.   Pulmonary/Chest: Effort normal.  Abdominal: She exhibits no distension.  Musculoskeletal: Normal range of motion. She exhibits tenderness.  2cm abscess under left axilla  Neurological: She is alert and oriented to person, place, and time.  Psychiatric: She has a normal mood and affect.    ED Course  INCISION AND DRAINAGE Date/Time: 11/03/2013 10:04 PM Performed by: Elson AreasSOFIA, LESLIE K Authorized by: Elson AreasSOFIA, LESLIE K Consent: Verbal consent obtained. Risks and benefits: risks, benefits and alternatives were discussed Consent given by: patient Patient identity confirmed: verbally with patient Type: abscess Body area: upper extremity Anesthesia: local infiltration Local anesthetic: lidocaine 2% without epinephrine Patient sedated: no Scalpel size: 11 Incision type: elliptical Complexity: simple Drainage: purulent Wound treatment: wound left open Patient tolerance: Patient tolerated the procedure well with no immediate complications.   (including critical care time) Labs Review Labs Reviewed - No data to display  Imaging Review No results found.   EKG Interpretation None      MDM   Final diagnoses:  None    Bactrim Hydrocodone Return if any problems.    Lonia SkinnerLeslie K Neuse ForestSofia, PA-C 11/03/13 2205

## 2013-11-03 NOTE — Discharge Instructions (Signed)
Abscess An abscess is an infected area that contains a collection of pus and debris.It can occur in almost any part of the body. An abscess is also known as a furuncle or boil. CAUSES  An abscess occurs when tissue gets infected. This can occur from blockage of oil or sweat glands, infection of hair follicles, or a minor injury to the skin. As the body tries to fight the infection, pus collects in the area and creates pressure under the skin. This pressure causes pain. People with weakened immune systems have difficulty fighting infections and get certain abscesses more often.  SYMPTOMS Usually an abscess develops on the skin and becomes a painful mass that is red, warm, and tender. If the abscess forms under the skin, you may feel a moveable soft area under the skin. Some abscesses break open (rupture) on their own, but most will continue to get worse without care. The infection can spread deeper into the body and eventually into the bloodstream, causing you to feel ill.  DIAGNOSIS  Your caregiver will take your medical history and perform a physical exam. A sample of fluid may also be taken from the abscess to determine what is causing your infection. TREATMENT  Your caregiver may prescribe antibiotic medicines to fight the infection. However, taking antibiotics alone usually does not cure an abscess. Your caregiver may need to make a small cut (incision) in the abscess to drain the pus. In some cases, gauze is packed into the abscess to reduce pain and to continue draining the area. HOME CARE INSTRUCTIONS   Only take over-the-counter or prescription medicines for pain, discomfort, or fever as directed by your caregiver.  If you were prescribed antibiotics, take them as directed. Finish them even if you start to feel better.  If gauze is used, follow your caregiver's directions for changing the gauze.  To avoid spreading the infection:  Keep your draining abscess covered with a  bandage.  Wash your hands well.  Do not share personal care items, towels, or whirlpools with others.  Avoid skin contact with others.  Keep your skin and clothes clean around the abscess.  Keep all follow-up appointments as directed by your caregiver. SEEK MEDICAL CARE IF:   You have increased pain, swelling, redness, fluid drainage, or bleeding.  You have muscle aches, chills, or a general ill feeling.  You have a fever. MAKE SURE YOU:   Understand these instructions.  Will watch your condition.  Will get help right away if you are not doing well or get worse. Document Released: 04/18/2005 Document Revised: 01/08/2012 Document Reviewed: 09/21/2011 [employer] Patient Information 2014 [employer].  

## 2013-11-04 NOTE — ED Provider Notes (Signed)
Medical screening examination/treatment/procedure(s) were performed by non-physician practitioner and as supervising physician I was immediately available for consultation/collaboration.   Shanna CiscoMegan E Docherty, MD 11/04/13 1130

## 2013-12-23 ENCOUNTER — Ambulatory Visit (INDEPENDENT_AMBULATORY_CARE_PROVIDER_SITE_OTHER): Payer: 59 | Admitting: Podiatry

## 2013-12-23 ENCOUNTER — Encounter: Payer: Self-pay | Admitting: Podiatry

## 2013-12-23 VITALS — BP 131/91 | HR 66 | Ht 65.0 in | Wt 240.0 lb

## 2013-12-23 DIAGNOSIS — M659 Synovitis and tenosynovitis, unspecified: Secondary | ICD-10-CM

## 2013-12-23 DIAGNOSIS — M79609 Pain in unspecified limb: Secondary | ICD-10-CM

## 2013-12-23 NOTE — Patient Instructions (Signed)
Seen for pain in right foot. Injection given. Return in 2 weeks if pain continues.

## 2013-12-23 NOTE — Progress Notes (Signed)
38 year old female presents complaining of pain in right forefoot area for the past 3 days. Hurts when on weight bearing. Do not have injury or incident associated with the pain. Has had previous surgery, Cotton osteotomy with graft, STJ Arthroereisis right foot.   Objective: Old scar over the first cuneiform and over 2nd MPJ area. Palpable fibrotic mass over the 2nd Metatarsal shaft with pain upon pressing on it. Neurovascular status are within normal.  No acute redness or edema noted.   Radiographic examination of right foot reveal old surgery; STJ implant, healed off Cotton osteotomy with graft, with reduced 2nd metatarsal head with uneven trabecular patterns. Affected area of 2nd Metatarsal shaft is clear of any acute changes.   Findings show no acute changes.  Assessment: Fibrotic mass 2nd MPJ area right foot without visible edema or erythema.   Plan: Reviewed findings.  2nd MPJ area Injected with mixture of 4 mg Dexamethasone, 4 mg Triamcinolone, and 1 cc of 0.5% Marcaine plain.  Patient tolerated well without difficulty.  Return in 2 weeks if pain continues.

## 2013-12-25 ENCOUNTER — Ambulatory Visit (INDEPENDENT_AMBULATORY_CARE_PROVIDER_SITE_OTHER): Payer: 59 | Admitting: Podiatry

## 2013-12-25 ENCOUNTER — Encounter: Payer: Self-pay | Admitting: Podiatry

## 2013-12-25 VITALS — BP 140/95 | HR 66 | Ht 65.0 in | Wt 240.0 lb

## 2013-12-25 DIAGNOSIS — R609 Edema, unspecified: Secondary | ICD-10-CM

## 2013-12-25 DIAGNOSIS — M79609 Pain in unspecified limb: Secondary | ICD-10-CM

## 2013-12-25 DIAGNOSIS — M659 Synovitis and tenosynovitis, unspecified: Secondary | ICD-10-CM

## 2013-12-25 DIAGNOSIS — M79606 Pain in leg, unspecified: Secondary | ICD-10-CM | POA: Insufficient documentation

## 2013-12-25 NOTE — Patient Instructions (Signed)
May take a few more days for cortisone injection take effect.  May benefit from Air walker to immobilize the affected right lower limb.

## 2013-12-25 NOTE — Progress Notes (Signed)
Presents with pain and swelling with no improvement following Cortisone injection two days ago.   Objective:  Localized forefoot swelling at first inter metatarsal space and palpable fibrotic mass 2nd MPJ area right foot. No increased temperature or color change noted. Pain is while the foot is in dependency position, sitting, dangling or walking.   Assessment: Rule out torn capsule or injured intrinsic ligaments near 2nd MPJ left foot.   Plan: May take few more days to improve before Cortisone became effective. May need be immobilized if torn capsule.  Right foot placed in Air walker. Return in 2 weeks to follow up.

## 2014-01-08 ENCOUNTER — Encounter: Payer: Self-pay | Admitting: Podiatry

## 2014-01-08 ENCOUNTER — Ambulatory Visit (INDEPENDENT_AMBULATORY_CARE_PROVIDER_SITE_OTHER): Payer: 59 | Admitting: Podiatry

## 2014-01-08 VITALS — BP 131/89 | HR 83

## 2014-01-08 DIAGNOSIS — M659 Synovitis and tenosynovitis, unspecified: Secondary | ICD-10-CM

## 2014-01-08 DIAGNOSIS — M79609 Pain in unspecified limb: Secondary | ICD-10-CM

## 2014-01-08 DIAGNOSIS — M65979 Unspecified synovitis and tenosynovitis, unspecified ankle and foot: Secondary | ICD-10-CM

## 2014-01-08 NOTE — Progress Notes (Signed)
Been about 3 weeks since the pain started at the first and 2nd MPJ area. CAM walker is helping and pain is much less. Not on any medication.   Objective: Much less pain on the right foot. No edema or erythema noted.  Assessment: Improving foot pain with CAM walker.   Plan: May continue with CAM walker for another 2 weeks.  Return if pain gets worse.

## 2014-01-08 NOTE — Patient Instructions (Signed)
Follow up on right foot pain. Doing better with CAM walker and pain is less. Continue to use CAM walker as needed for the next 2 weeks.  Return as needed.

## 2014-01-11 ENCOUNTER — Ambulatory Visit: Payer: 59 | Admitting: Podiatry

## 2014-03-30 ENCOUNTER — Ambulatory Visit: Payer: 59 | Admitting: Family Medicine

## 2014-05-31 ENCOUNTER — Encounter (HOSPITAL_BASED_OUTPATIENT_CLINIC_OR_DEPARTMENT_OTHER): Payer: Self-pay | Admitting: *Deleted

## 2014-05-31 ENCOUNTER — Emergency Department (HOSPITAL_BASED_OUTPATIENT_CLINIC_OR_DEPARTMENT_OTHER)
Admission: EM | Admit: 2014-05-31 | Discharge: 2014-05-31 | Disposition: A | Discharge status: Home / Self Care | Payer: 59 | Attending: Emergency Medicine | Admitting: Emergency Medicine

## 2014-05-31 DIAGNOSIS — Z87448 Personal history of other diseases of urinary system: Secondary | ICD-10-CM | POA: Insufficient documentation

## 2014-05-31 DIAGNOSIS — M654 Radial styloid tenosynovitis [de Quervain]: Secondary | ICD-10-CM

## 2014-05-31 DIAGNOSIS — Z8619 Personal history of other infectious and parasitic diseases: Secondary | ICD-10-CM | POA: Insufficient documentation

## 2014-05-31 DIAGNOSIS — Z79899 Other long term (current) drug therapy: Secondary | ICD-10-CM | POA: Insufficient documentation

## 2014-05-31 DIAGNOSIS — M79642 Pain in left hand: Secondary | ICD-10-CM | POA: Diagnosis present

## 2014-05-31 DIAGNOSIS — Z8719 Personal history of other diseases of the digestive system: Secondary | ICD-10-CM | POA: Insufficient documentation

## 2014-05-31 DIAGNOSIS — G43909 Migraine, unspecified, not intractable, without status migrainosus: Secondary | ICD-10-CM | POA: Diagnosis not present

## 2014-05-31 MED ORDER — IBUPROFEN 800 MG PO TABS: 800.0000 mg | ORAL_TABLET | Freq: Three times a day (TID) | ORAL | Status: DC | PRN

## 2014-05-31 MED ORDER — IBUPROFEN 800 MG PO TABS
800.0000 mg | ORAL_TABLET | Freq: Once | ORAL | Status: AC
  Administered 2014-05-31: 800 mg via ORAL
  Filled 2014-05-31: qty 1

## 2014-05-31 MED ORDER — TRAMADOL HCL 50 MG PO TABS: 50.0000 mg | ORAL_TABLET | Freq: Four times a day (QID) | ORAL | Status: DC | PRN

## 2014-05-31 NOTE — ED Provider Notes (Signed)
TIME SEEN: 7:53 PM  CHIEF COMPLAINT: Left hand Pain Chief Complaint  Patient presents with  . Hand Pain   HPI:  Meredith Barnes is a 38 y.o. female who presents to the Emergency Department complaining of left thumb pain which began about 3 days ago. Pt does not know how she injured her hand. She has some mild swelling in her forearm . Pt does have sensation and feeling in her hand. 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 She is right-hand dominant. No focal weakness. No erythema or warmth. No fever.  ROS: See HPI Constitutional: no fever  Eyes: no drainage  ENT: no runny nose   Cardiovascular:  no chest pain  Resp: no SOB  GI: no vomiting GU: no dysuria Integumentary: no rash  Allergy: no hives  Musculoskeletal: no leg swelling  Neurological: no slurred speech, denies loss of sensation in her hand ROS otherwise negative  PAST MEDICAL HISTORY/PAST SURGICAL HISTORY:  Past Medical History  Diagnosis Date  . Ectopic pregnancy   . Hx of hysterectomy partial for cervicl CA 2003  . Sigmoidoscopy exam partial sigmoid colectomy 07-23-08  . Migraine headache   . Diverticulitis   . Cyst, ovary, follicular   . H. pylori infection     MEDICATIONS:  Prior to Admission medications   Medication Sig Start Date End Date Taking? Authorizing Provider  albuterol (PROVENTIL HFA;VENTOLIN HFA) 108 (90 BASE) MCG/ACT inhaler Inhale 1-2 puffs into the lungs every 4 (four) hours as needed for wheezing. 07/26/12   Richardean Canalavid H Yao, MD  budesonide-formoterol [facility](SYMBICORT) 160-4.5 MCG/ACT inhaler Inhale 2 puffs into the lungs 2 (two) times daily.    Historical Provider, MD  diphenhydrAMINE (BENADRYL) 25 MG tablet Take 1 tablet (25 mg total) by mouth every 6 (six) hours. 12/24/12   Glynn OctaveStephen Rancour, MD  diphenoxylate-atropine (LOMOTIL) 2.5-0.025 MG per tablet Take 1-2 tablets by mouth 4 (four) times daily as needed for diarrhea or loose stools. 08/26/12   Carlisle BeersJohn L Molpus, MD   HYDROcodone-acetaminophen (NORCO/VICODIN) 5-325 MG per tablet Take 1 tablet by mouth every 4 (four) hours as needed. 11/03/13   Elson AreasLeslie K Sofia, PA-C  metoCLOPramide (REGLAN) 10 MG tablet Take 1 tablet (10 mg total) by mouth every 6 (six) hours. 12/11/11 12/21/11  Loren Raceravid Yelverton, MD  metroNIDAZOLE (FLAGYL) 500 MG tablet Take 1 tablet (500 mg total) by mouth 2 (two) times daily. One po bid x 7 days 07/04/12   Geoffery Lyonsouglas Delo, MD  ondansetron (ZOFRAN ODT) 8 MG disintegrating tablet Take 1 tablet (8 mg total) by mouth every 8 (eight) hours as needed for nausea. 08/26/12   Carlisle BeersJohn L Molpus, MD  oxyCODONE-acetaminophen (PERCOCET) 5-325 MG per tablet Take 2 tablets by mouth every 4 (four) hours as needed for pain. 10/28/12   Rolan BuccoMelanie Belfi, MD  sulfamethoxazole-trimethoprim (SEPTRA DS) 800-160 MG per tablet Take 1 tablet by mouth every 12 (twelve) hours. 10/28/12   Rolan BuccoMelanie Belfi, MD  sulfamethoxazole-trimethoprim (SEPTRA DS) 800-160 MG per tablet Take 1 tablet by mouth 2 (two) times daily. 11/03/13   Elson AreasLeslie K Sofia, PA-C    ALLERGIES:  Allergies  Allergen Reactions  . Iodine Nausea And Vomiting    Pt states she is not allergic to iodine.  Berle Mull. Ivp Dye [Iodinated Diagnostic Agents] Nausea And Vomiting  . Tape     Paper tape-rips skin    SOCIAL HISTORY:  History  Substance Use Topics  . Smoking status: Never Smoker   . Smokeless tobacco: Never Used  .  Alcohol Use: No    FAMILY HISTORY: No family history on file.  EXAM: BP 123/84 mmHg  Pulse 91  Temp(Src) 98.8 F (37.1 C) (Oral)  Resp 18  Ht 5\' 5"  (1.651 m)  Wt 240 lb (108.863 kg)  BMI 39.94 kg/m2  SpO2 100% CONSTITUTIONAL: Alert and oriented and responds appropriately to questions. Well-appearing; well-nourished HEAD: Normocephalic EYES: Conjunctivae clear, PERRL ENT: normal nose; no rhinorrhea; moist mucous membranes; pharynx without lesions noted NECK: Supple, no meningismus, no LAD  CARD: RRR; S1 and S2 appreciated; no murmurs, no clicks, no  rubs, no gallops RESP: Normal chest excursion without splinting or tachypnea; breath sounds clear and equal bilaterally; no wheezes, no rhonchi, no rales,  ABD/GI: Normal bowel sounds; non-distended; soft, non-tender, no rebound, no guarding BACK:  The back appears normal and is non-tender to palpation, there is no CVA tenderness EXT: patient is tender to palpation over the left radial aspect of the thumb and the thenar eminence with no erythema or warmth or induration, patient has a positive Finkelstein's, no ligamentous laxity, no joint effusions,  Normal ROM in all joints; 2+ radial pulses bilaterally, sensation to light touch intact diffusely, otherwise extremities are non-tender to palpation; no edema; normal capillary refill; no cyanosis    SKIN: Normal color for age and race; warm NEURO: Moves all extremities equally PSYCH: The patient's mood and manner are appropriate. Grooming and personal hygiene are appropriate.  MEDICAL DECISION MAKING: Pt here with De Quervain's tenosynovitis. Will give her a thumb spica Velcro wrist splint and discharge with prescription for ibuprofen, Vicodin. We'll have her rest, elevate and apply ice to her hand. We'll get hand surgery follow-up information. Discussed return precautions. She verbalized understanding is comfortable with plan.    I personally performed the services described in this documentation, which was scribed in my presence. The recorded information has been reviewed and is accurate.     Layla MawKristen N Ward, DO 05/31/14 2038

## 2014-05-31 NOTE — ED Notes (Signed)
Pain in her left thumb x 3 days. No injury. Swelling in her forearm today.

## 2014-05-31 NOTE — Discharge Instructions (Signed)
De Quervain's Tenosynovitis De Quervain's tenosynovitis involves inflammation of one or two tendon linings (sheaths) or strain of one or two tendons to the thumb: extensor pollicis brevis (EPB), or abductor pollicis longus (APL). This causes pain on the side of the wrist and base of the thumb. Tendon sheaths secrete a fluid that lubricates the tendon, allowing the tendon to move smoothly. When the sheath becomes inflamed, the tendon cannot move freely in the sheath. Both the EPB and APL tendons are important for proper use of the hand. The EPB tendon is important for straightening the thumb. The APL tendon is important for moving the thumb away from the index finger (abducting). The two tendons pass through a small tube (canal) in the wrist, near the base of the thumb. When the tendons become inflamed, pain is usually felt in this area. SYMPTOMS   Pain, tenderness, swelling, warmth, or redness over the base of the thumb and thumb side of the wrist.  Pain that gets worse when straightening the thumb.  Pain that gets worse when moving the thumb away from the index finger, against resistance.  Pain with pinching or gripping.  Locking or catching of the thumb.  Limited motion of the thumb.  Crackling sound (crepitation) when the tendon or thumb is moved or touched.  Fluid-filled cyst in the area of the base of the thumb. CAUSES   Tenosynovitis is often linked with overuse of the wrist.  Tenosynovitis may be caused by repeated injury to the thumb muscle and tendon units, and with repeated motions of the hand and wrist, due to friction of the tendon within the lining (sheath).  Tenosynovitis may also be due to a sudden increase in activity or change in activity. RISK INCREASES WITH:  Sports that involve repeated hand and wrist motions (golf, bowling, tennis, squash, racquetball).  Heavy labor.  Poor physical wrist strength and flexibility.  Failure to warm up properly before practice or  play.  Female gender.  New mothers who hold their baby's head for long periods or lift infants with thumbs in the infant's armpit (axilla). PREVENTION  Warm up and stretch properly before practice or competition.  Allow enough time for rest and recovery between practices and competition.  Maintain appropriate conditioning:  Cardiovascular fitness.  Forearm, wrist, and hand flexibility.  Muscle strength and endurance.  Use proper exercise technique. PROGNOSIS  This condition is usually curable within 6 weeks, if treated properly with non-surgical treatment and resting of the affected area.  RELATED COMPLICATIONS   Longer healing time if not properly treated or if not given enough time to heal.  Chronic inflammation, causing recurring symptoms of tenosynovitis. Permanent pain or restriction of movement.  Risks of surgery: infection, bleeding, injury to nerves (numbness of the thumb), continued pain, incomplete release of the tendon sheath, recurring symptoms, cutting of the tendons, tendons sliding out of position, weakness of the thumb, thumb stiffness. TREATMENT  First, treatment involves the use of medicine and ice, to reduce pain and inflammation. Patients are encouraged to stop or modify activities that aggravate the injury. Stretching and strengthening exercises may be advised. Exercises may be completed at home or with a therapist. You may be fitted with a brace or splint, to limit motion and allow the injury to heal. Your caregiver may also choose to give you a corticosteroid injection, to reduce the pain and inflammation. If non-surgical treatment is not successful, surgery may be needed. Most tenosynovitis surgeries are done as outpatient procedures (you go home the   same day). Surgery may involve local, regional (whole arm), or general anesthesia.  MEDICATION   If pain medicine is needed, nonsteroidal anti-inflammatory medicines (aspirin and ibuprofen), or other minor pain  relievers (acetaminophen), are often advised.  Do not take pain medicine for 7 days before surgery.  Prescription pain relievers are often prescribed only after surgery. Use only as directed and only as much as you need.  Corticosteroid injections may be given if your caregiver thinks they are needed. There is a limited number of times these injections may be given. COLD THERAPY   Cold treatment (icing) should be applied for 10 to 15 minutes every 2 to 3 hours for inflammation and pain, and immediately after activity that aggravates your symptoms. Use ice packs or an ice massage. SEEK MEDICAL CARE IF:   Symptoms get worse or do not improve in 2 to 4 weeks, despite treatment.  You experience pain, numbness, or coldness in the hand.  Blue, gray, or dark color appears in the fingernails.  Any of the following occur after surgery: increased pain, swelling, redness, drainage of fluids, bleeding in the affected area, or signs of infection.  New, unexplained symptoms develop. (Drugs used in treatment may produce side effects.) Document Released: 07/09/2005 Document Revised: 10/01/2011 Document Reviewed: 10/21/2008 [facility] Patient Information 2015 [employer] [place]. This information is not intended to replace advice given to you by your health care provider. Make sure you discuss any questions you have with your health care provider.  RICE: Routine Care for Injuries The routine care of many injuries includes Rest, Ice, Compression, and Elevation (RICE). HOME CARE INSTRUCTIONS  Rest is needed to allow your body to heal. Routine activities can usually be resumed when comfortable. Injured tendons and bones can take up to 6 weeks to heal. Tendons are the cord-like structures that attach muscle to bone.  Ice following an injury helps keep the swelling down and reduces pain.  Put ice in a plastic bag.  Place a towel between your skin and the bag.  Leave the ice on for 15-20 minutes, 3-4 times a  day, or as directed by your health care provider. Do this while awake, for the first 24 to 48 hours. After that, continue as directed by your caregiver.  Compression helps keep swelling down. It also gives support and helps with discomfort. If an elastic bandage has been applied, it should be removed and reapplied every 3 to 4 hours. It should not be applied tightly, but firmly enough to keep swelling down. Watch fingers or toes for swelling, bluish discoloration, coldness, numbness, or excessive pain. If any of these problems occur, remove the bandage and reapply loosely. Contact your caregiver if these problems continue.  Elevation helps reduce swelling and decreases pain. With extremities, such as the arms, hands, legs, and feet, the injured area should be placed near or above the level of the heart, if possible. SEEK IMMEDIATE MEDICAL CARE IF:  You have persistent pain and swelling.  You develop redness, numbness, or unexpected weakness.  Your symptoms are getting worse rather than improving after several days. These symptoms may indicate that further evaluation or further X-rays are needed. Sometimes, X-rays may not show a small broken bone (fracture) until 1 week or 10 days later. Make a follow-up appointment with your caregiver. Ask when your X-ray results will be ready. Make sure you get your X-ray results. Document Released: 10/21/2000 Document Revised: 07/14/2013 Document Reviewed: 12/08/2010 [facility] Patient Information 2015 [place], [place]. This information is not intended  to replace advice given to you by your health care provider. Make sure you discuss any questions you have with your health care provider. ° °

## 2014-08-17 LAB — HM COLONOSCOPY

## 2015-04-03 ENCOUNTER — Encounter (HOSPITAL_BASED_OUTPATIENT_CLINIC_OR_DEPARTMENT_OTHER): Payer: Self-pay

## 2015-04-03 ENCOUNTER — Emergency Department (HOSPITAL_BASED_OUTPATIENT_CLINIC_OR_DEPARTMENT_OTHER)
Admission: EM | Admit: 2015-04-03 | Discharge: 2015-04-03 | Disposition: A | Discharge status: Home / Self Care | Payer: 59 | Attending: Emergency Medicine | Admitting: Emergency Medicine

## 2015-04-03 DIAGNOSIS — Z8679 Personal history of other diseases of the circulatory system: Secondary | ICD-10-CM | POA: Diagnosis not present

## 2015-04-03 DIAGNOSIS — Z79899 Other long term (current) drug therapy: Secondary | ICD-10-CM | POA: Insufficient documentation

## 2015-04-03 DIAGNOSIS — Z792 Long term (current) use of antibiotics: Secondary | ICD-10-CM | POA: Diagnosis not present

## 2015-04-03 DIAGNOSIS — Z8719 Personal history of other diseases of the digestive system: Secondary | ICD-10-CM | POA: Insufficient documentation

## 2015-04-03 DIAGNOSIS — N6081 Other benign mammary dysplasias of right breast: Secondary | ICD-10-CM | POA: Insufficient documentation

## 2015-04-03 DIAGNOSIS — N644 Mastodynia: Secondary | ICD-10-CM | POA: Diagnosis present

## 2015-04-03 DIAGNOSIS — Z8619 Personal history of other infectious and parasitic diseases: Secondary | ICD-10-CM | POA: Diagnosis not present

## 2015-04-03 MED ORDER — NAPROXEN 250 MG PO TABS
500.0000 mg | ORAL_TABLET | Freq: Once | ORAL | Status: AC
  Administered 2015-04-03: 500 mg via ORAL
  Filled 2015-04-03: qty 2

## 2015-04-03 MED ORDER — NAPROXEN 500 MG PO TABS: 500.0000 mg | ORAL_TABLET | Freq: Two times a day (BID) | ORAL | Status: DC

## 2015-04-03 NOTE — Discharge Instructions (Signed)
Return for any new or worse symptoms. Take the Naprosyn as directed. This appears to be a cyst itself formed on the skin of the right breast and not a breast abscess.

## 2015-04-03 NOTE — ED Provider Notes (Signed)
CSN: [id number]     Arrival date & time 04/03/15  1730 History  This chart was scribed for Meredith Mulders, MD by Budd Palmer, ED Scribe. This patient was seen in room MH12/MH12 and the patient's care was started at 7:09 PM.    Chief Complaint  Patient presents with  . Breast Pain   The history is provided by the patient. No language interpreter was used.   HPI Comments: Meredith Barnes [place] is a 39 y.o. female who presents to the Emergency Department complaining of constant, aching right breast pain onset 1 day ago. She reports associated erythema to the area where the pain is localized, and was able to drain purulent liquid from the area. She reports abdominal pain, but states this is not unusual for her.  PMHx of breast abscess on the left side. Pt denies fever.  Past Medical History  Diagnosis Date  . Ectopic pregnancy   . Hx of hysterectomy partial for cervicl CA 2003  . Sigmoidoscopy exam partial sigmoid colectomy 07-23-08  . Migraine headache   . Diverticulitis   . Cyst, ovary, follicular   . H. pylori infection    Past Surgical History  Procedure Laterality Date  . Partial hysterectomy  2003    Cervical Cancer  . Ectopic pregnancy surgery    . Partial sigmoid colectomy 07-23-08  07-23-08  . Oophorectomy    . Abdominal hysterectomy    . Foot surgery    . Hernia repair     No family history on file. Social History  Substance Use Topics  . Smoking status: Never Smoker   . Smokeless tobacco: Never Used  . Alcohol Use: No   OB History    No data available     Review of Systems  Constitutional: Negative for fever and chills.  HENT: Negative for rhinorrhea and sore throat.   Eyes: Negative for visual disturbance.  Respiratory: Negative for cough and shortness of breath.   Cardiovascular: Positive for chest pain. Negative for leg swelling.  Gastrointestinal: Positive for abdominal pain (baseline). Negative for nausea, vomiting and diarrhea.  Genitourinary: Negative for  dysuria.  Musculoskeletal: Negative for back pain.  Skin: Positive for color change. Negative for rash.  Neurological: Positive for headaches.  Hematological: Does not bruise/bleed easily.    Allergies  Iodine; Ivp dye; and Tape  Home Medications   Prior to Admission medications   Medication Sig Start Date End Date Taking? Authorizing Provider  albuterol (PROVENTIL HFA;VENTOLIN HFA) 108 (90 BASE) MCG/ACT inhaler Inhale 1-2 puffs into the lungs every 4 (four) hours as needed for wheezing. 07/26/12   Richardean Canal, MD  budesonide-formoterol [facility]) 160-4.5 MCG/ACT inhaler Inhale 2 puffs into the lungs 2 (two) times daily.    Historical Provider, MD  diphenhydrAMINE (BENADRYL) 25 MG tablet Take 1 tablet (25 mg total) by mouth every 6 (six) hours. 12/24/12   [name], MD  diphenoxylate-atropine (LOMOTIL) 2.5-0.025 MG per tablet Take 1-2 tablets by mouth 4 (four) times daily as needed for diarrhea or loose stools. 08/26/12   Paula Libra, MD  HYDROcodone-acetaminophen (NORCO/VICODIN) 5-325 MG per tablet Take 1 tablet by mouth every 4 (four) hours as needed. 11/03/13   Elson Areas, PA-C  ibuprofen (ADVIL,MOTRIN) 800 MG tablet Take 1 tablet (800 mg total) by mouth every 8 (eight) hours as needed for mild pain. 05/31/14   Kristen N Ward, DO  metoCLOPramide (REGLAN) 10 MG tablet Take 1 tablet (10 mg total) by mouth every 6 (six) hours. 12/11/11  12/21/11  Loren Racer, MD  metroNIDAZOLE (FLAGYL) 500 MG tablet Take 1 tablet (500 mg total) by mouth 2 (two) times daily. One po bid x 7 days 07/04/12   Geoffery Lyons, MD  naproxen (NAPROSYN) 500 MG tablet Take 1 tablet (500 mg total) by mouth 2 (two) times daily. 04/03/15   Meredith Mulders, MD  ondansetron (ZOFRAN ODT) 8 MG disintegrating tablet Take 1 tablet (8 mg total) by mouth every 8 (eight) hours as needed for nausea. 08/26/12   John Molpus, MD  oxyCODONE-acetaminophen (PERCOCET) 5-325 MG per tablet Take 2 tablets by mouth every 4 (four) hours as  needed for pain. 10/28/12   Rolan Bucco, MD  sulfamethoxazole-trimethoprim (SEPTRA DS) 800-160 MG per tablet Take 1 tablet by mouth every 12 (twelve) hours. 10/28/12   Rolan Bucco, MD  sulfamethoxazole-trimethoprim (SEPTRA DS) 800-160 MG per tablet Take 1 tablet by mouth 2 (two) times daily. 11/03/13   Elson Areas, PA-C  traMADol (ULTRAM) 50 MG tablet Take 1 tablet (50 mg total) by mouth every 6 (six) hours as needed. 05/31/14   Kristen N Ward, DO   BP 131/80 mmHg  Pulse 111  Temp(Src) 98.8 F (37.1 C) (Oral)  Resp 18  Ht  (1.651 m)  Wt 220 lb (99.791 kg)  BMI 36.61 kg/m2  SpO2 100% Physical Exam  Constitutional: She is oriented to person, place, and time. She appears well-developed and well-nourished.  HENT:  Head: Normocephalic and atraumatic.  Mouth/Throat: Oropharynx is clear and moist.  Eyes: Conjunctivae and EOM are normal. Pupils are equal, round, and reactive to light. Right eye exhibits no discharge. Left eye exhibits no discharge. No scleral icterus.  Cardiovascular: Normal rate, regular rhythm and normal heart sounds.   Pulmonary/Chest: Effort normal and breath sounds normal. No respiratory distress. She exhibits tenderness.  L breast: area of redness about 1.5 cm in diameter,no induration. Punctate area with scabbing measuring about 0.5 cm. No masses detected on either breast.  Abdominal: Soft. Bowel sounds are normal. There is no tenderness.  Musculoskeletal: She exhibits no edema.  No ankle swelling  Neurological: She is alert and oriented to person, place, and time. No cranial nerve deficit. She exhibits normal muscle tone. Coordination normal.  Skin: Skin is warm and dry. No rash noted. She is not diaphoretic. No erythema.  Psychiatric: She has a normal mood and affect.  Nursing note and vitals reviewed.   ED Course  Procedures  DIAGNOSTIC STUDIES: Oxygen Saturation is 100% on RA, normal by my interpretation.    COORDINATION OF CARE: 7:14 PM - Discussed  probable skin cyst. Discussed plans to order naproxen. Pt advised of plan for treatment and pt agrees.  Labs Review Labs Reviewed - No data to display  Imaging Review No results found. I have personally reviewed and evaluated these images and lab results as part of my medical decision-making.   EKG Interpretation None      MDM   Final diagnoses:  Cyst of skin of breast, right    Patient with skin cyst to the medial aspect of the right breast, not consistent with a breast abscess but more of a small skin abscess it's already drained. No induration. No further fluctuance. Patient nontoxic no acute distress. How will treat with anti-inflammatory medicine. Antibodies not required.    I personally performed the services described in this documentation, which was scribed in my presence. The recorded information has been reviewed and is accurate.    Meredith Mulders, MD 04/03/15 1921

## 2015-04-03 NOTE — ED Notes (Signed)
Pt reports red area for awhile noted to right breast but yesterday pain started and she drained pus from red area.

## 2015-05-28 ENCOUNTER — Encounter (HOSPITAL_BASED_OUTPATIENT_CLINIC_OR_DEPARTMENT_OTHER): Payer: Self-pay | Admitting: Emergency Medicine

## 2015-05-28 ENCOUNTER — Emergency Department (HOSPITAL_BASED_OUTPATIENT_CLINIC_OR_DEPARTMENT_OTHER): Payer: 59

## 2015-05-28 ENCOUNTER — Emergency Department (HOSPITAL_BASED_OUTPATIENT_CLINIC_OR_DEPARTMENT_OTHER)
Admission: EM | Admit: 2015-05-28 | Discharge: 2015-05-28 | Disposition: A | Discharge status: Home / Self Care | Payer: 59 | Attending: Physician Assistant | Admitting: Physician Assistant

## 2015-05-28 DIAGNOSIS — Z90710 Acquired absence of both cervix and uterus: Secondary | ICD-10-CM | POA: Insufficient documentation

## 2015-05-28 DIAGNOSIS — M2012 Hallux valgus (acquired), left foot: Secondary | ICD-10-CM | POA: Diagnosis not present

## 2015-05-28 DIAGNOSIS — Z8719 Personal history of other diseases of the digestive system: Secondary | ICD-10-CM | POA: Insufficient documentation

## 2015-05-28 DIAGNOSIS — Z792 Long term (current) use of antibiotics: Secondary | ICD-10-CM | POA: Insufficient documentation

## 2015-05-28 DIAGNOSIS — R52 Pain, unspecified: Secondary | ICD-10-CM

## 2015-05-28 DIAGNOSIS — G43909 Migraine, unspecified, not intractable, without status migrainosus: Secondary | ICD-10-CM | POA: Diagnosis not present

## 2015-05-28 DIAGNOSIS — Z8619 Personal history of other infectious and parasitic diseases: Secondary | ICD-10-CM | POA: Diagnosis not present

## 2015-05-28 DIAGNOSIS — Z79899 Other long term (current) drug therapy: Secondary | ICD-10-CM | POA: Diagnosis not present

## 2015-05-28 DIAGNOSIS — M79675 Pain in left toe(s): Secondary | ICD-10-CM | POA: Diagnosis not present

## 2015-05-28 DIAGNOSIS — M2011 Hallux valgus (acquired), right foot: Secondary | ICD-10-CM | POA: Diagnosis not present

## 2015-05-28 DIAGNOSIS — Z9889 Other specified postprocedural states: Secondary | ICD-10-CM | POA: Diagnosis not present

## 2015-05-28 MED ORDER — IBUPROFEN 800 MG PO TABS: 800.0000 mg | ORAL_TABLET | Freq: Three times a day (TID) | ORAL | Status: DC

## 2015-05-28 NOTE — ED Notes (Signed)
Patient states that she has had pain to her left pinky toe x 1 days.

## 2015-05-28 NOTE — ED Provider Notes (Signed)
CSN: [id number]     Arrival date & time 05/28/15  2029 History  By signing my name below, I, Arianna Nassar, attest that this documentation has been prepared under the direction and in the presence of Tyge Somers Randall An, MD. Electronically Signed: Octavia Heir, ED Scribe. 05/28/2015. 9:18 PM.    Chief Complaint  Patient presents with  . Toe Pain     The history is provided by the patient. No language interpreter was used.   HPI Comments: Meredith Barnes is a 39 y.o. female who presents to the Emergency Department complaining of constant, gradual worsening left pinky toe pain onset 1 week ago. She has a bunyan on her bilateral pinky toes but states her left pinky toe hurts worse. She has not taken any medication to alleviate the pain. Pt reports she wears tennis shoes at her job and high heels on occasion. She denies any injury to her toe. Pt had a bone graft done on her right foot.  Past Medical History  Diagnosis Date  . Ectopic pregnancy   . Hx of hysterectomy partial for cervicl CA 2003  . Sigmoidoscopy exam partial sigmoid colectomy 07-23-08  . Migraine headache   . Diverticulitis   . Cyst, ovary, follicular   . H. pylori infection    Past Surgical History  Procedure Laterality Date  . Partial hysterectomy  2003    Cervical Cancer  . Ectopic pregnancy surgery    . Partial sigmoid colectomy 07-23-08  07-23-08  . Oophorectomy    . Abdominal hysterectomy    . Foot surgery    . Hernia repair     History reviewed. No pertinent family history. Social History  Substance Use Topics  . Smoking status: Never Smoker   . Smokeless tobacco: Never Used  . Alcohol Use: No   OB History    No data available     Review of Systems  Musculoskeletal: Positive for arthralgias.  All other systems reviewed and are negative.     Allergies  Iodine; Ivp dye; and Tape  Home Medications   Prior to Admission medications   Medication Sig Start Date End Date Taking? Authorizing Provider   albuterol (PROVENTIL HFA;VENTOLIN HFA) 108 (90 BASE) MCG/ACT inhaler Inhale 1-2 puffs into the lungs every 4 (four) hours as needed for wheezing. 07/26/12   Richardean Canal, MD  budesonide-formoterol [facility]) 160-4.5 MCG/ACT inhaler Inhale 2 puffs into the lungs 2 (two) times daily.    Historical Provider, MD  diphenhydrAMINE (BENADRYL) 25 MG tablet Take 1 tablet (25 mg total) by mouth every 6 (six) hours. 12/24/12   [name], MD  diphenoxylate-atropine (LOMOTIL) 2.5-0.025 MG per tablet Take 1-2 tablets by mouth 4 (four) times daily as needed for diarrhea or loose stools. 08/26/12   Paula Libra, MD  HYDROcodone-acetaminophen (NORCO/VICODIN) 5-325 MG per tablet Take 1 tablet by mouth every 4 (four) hours as needed. 11/03/13   Elson Areas, PA-C  ibuprofen (ADVIL,MOTRIN) 800 MG tablet Take 1 tablet (800 mg total) by mouth every 8 (eight) hours as needed for mild pain. 05/31/14   Kristen N Ward, DO  metoCLOPramide (REGLAN) 10 MG tablet Take 1 tablet (10 mg total) by mouth every 6 (six) hours. 12/11/11 12/21/11  Loren Racer, MD  metroNIDAZOLE (FLAGYL) 500 MG tablet Take 1 tablet (500 mg total) by mouth 2 (two) times daily. One po bid x 7 days 07/04/12   Geoffery Lyons, MD  naproxen (NAPROSYN) 500 MG tablet Take 1 tablet (500 mg total) by  mouth 2 (two) times daily. 04/03/15   Vanetta MuldersScott Zackowski, MD  ondansetron (ZOFRAN ODT) 8 MG disintegrating tablet Take 1 tablet (8 mg total) by mouth every 8 (eight) hours as needed for nausea. 08/26/12   John Molpus, MD  oxyCODONE-acetaminophen (PERCOCET) 5-325 MG per tablet Take 2 tablets by mouth every 4 (four) hours as needed for pain. 10/28/12   Rolan BuccoMelanie Belfi, MD  sulfamethoxazole-trimethoprim (SEPTRA DS) 800-160 MG per tablet Take 1 tablet by mouth every 12 (twelve) hours. 10/28/12   Rolan BuccoMelanie Belfi, MD  sulfamethoxazole-trimethoprim (SEPTRA DS) 800-160 MG per tablet Take 1 tablet by mouth 2 (two) times daily. 11/03/13   Elson AreasLeslie K Sofia, PA-C  traMADol (ULTRAM) 50 MG tablet Take  1 tablet (50 mg total) by mouth every 6 (six) hours as needed. 05/31/14   Kristen N Ward, DO   Triage vitals: BP 134/92 mmHg  Pulse 83  Temp(Src) 98.4 F (36.9 C) (Oral)  Resp 16  Ht 5\' 5"  (1.651 m)  Wt 240 lb (108.863 kg)  BMI 39.94 kg/m2  SpO2 100% Physical Exam  Constitutional: She is oriented to person, place, and time. She appears well-developed and well-nourished. No distress.  HENT:  Head: Normocephalic and atraumatic.  Eyes: EOM are normal.  Neck: Normal range of motion.  Cardiovascular: Normal rate, regular rhythm and normal heart sounds.   Pulmonary/Chest: Effort normal and breath sounds normal.  Abdominal: Soft. She exhibits no distension. There is no tenderness.  Musculoskeletal: Normal range of motion.  Bony prominence of left 5th toe  Neurological: She is alert and oriented to person, place, and time.  Skin: Skin is warm and dry.  Psychiatric: She has a normal mood and affect. Judgment normal.  Nursing note and vitals reviewed.   ED Course  Procedures  DIAGNOSTIC STUDIES: Oxygen Saturation is 100% on RA, normal by my interpretation.  COORDINATION OF CARE:  9:09 PM Discussed treatment plan which includes x-ray of left pinky toe with pt at bedside and pt agreed to plan.  Labs Review Labs Reviewed - No data to display  Imaging Review No results found. I have personally reviewed and evaluated these images and lab results as part of my medical decision-making.   EKG Interpretation None      MDM   Final diagnoses:  None    Patient is a very pleasant 39 year old female presenting with left foot pinky pain. Patient reports it getting worse over a couple days. It's worse with pressure. There is no real erythema. The bony prominences similar to the one on the right-hand side. Concern for Bunyan. We'll get x-ray. I don't see any signs of infection is not in the joint, no pain with joint movement. We will get an x-ray if negative have patient follow up with  primary care physician. We talked about supportive footwear and not wearing high heels.  I personally performed the services described in this documentation, which was scribed in my presence. The recorded information has been reviewed and is accurate.     Ormond Lazo Randall AnLyn Raziah Funnell, MD 05/28/15 2122

## 2015-05-28 NOTE — Discharge Instructions (Signed)
Unsure what is causing your toe pain. It appears that it is the same as the toe on the right-hand side. X-ray was negative. We recommend that you follow-up with your regular doctor, they may need to refer you to a podiatrist if your pain continues. Please use ibuprofen to help with the pain. Please do not wear high heels or any shoes that will scrunch her toes.

## 2015-12-13 ENCOUNTER — Ambulatory Visit (INDEPENDENT_AMBULATORY_CARE_PROVIDER_SITE_OTHER): Payer: 59 | Admitting: Podiatry

## 2015-12-13 ENCOUNTER — Encounter: Payer: Self-pay | Admitting: Podiatry

## 2015-12-13 VITALS — BP 132/85 | HR 79

## 2015-12-13 DIAGNOSIS — M79605 Pain in left leg: Secondary | ICD-10-CM | POA: Diagnosis not present

## 2015-12-13 DIAGNOSIS — L570 Actinic keratosis: Secondary | ICD-10-CM | POA: Diagnosis not present

## 2015-12-13 DIAGNOSIS — M2042 Other hammer toe(s) (acquired), left foot: Secondary | ICD-10-CM

## 2015-12-13 NOTE — Patient Instructions (Signed)
Seen for pain in 5th toe left. Noted of enlarged joint with digital corn. Debrided and padded. May benefit from surgical intervention. Return as needed.

## 2015-12-13 NOTE — Progress Notes (Signed)
Subjective: 40 year old female presents stating her toe hurts, 5th left.  Patient points digital corn on 5th digit left foot being the source of foot pain, for the past several days.   Objective: Digital corn over protruding IPJ 5th digit right. No open lesions. Neurovascular status are within normal.   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   Plan: Reviewed findings and available options explained.  Debrided and padded painful corn 5th left.

## 2016-05-01 LAB — CBC AND DIFFERENTIAL
HEMATOCRIT: 37 % (ref 36–46)
HEMOGLOBIN: 11.6 g/dL — AB (ref 12.0–16.0)
PLATELETS: 297 10*3/uL (ref 150–399)
WBC: 7.3 10^3/mL

## 2016-05-01 LAB — LIPID PANEL
Cholesterol: 185 mg/dL (ref 0–200)
HDL: 45 mg/dL (ref 35–70)
LDL CALC: 115 mg/dL
Triglycerides: 124 mg/dL (ref 40–160)

## 2016-05-01 LAB — BASIC METABOLIC PANEL
BUN: 8 mg/dL (ref 4–21)
Creatinine: 0.7 mg/dL (ref 0.5–1.1)
GLUCOSE: 90 mg/dL
Potassium: 3.9 mmol/L (ref 3.4–5.3)
SODIUM: 145 mmol/L (ref 137–147)

## 2016-05-01 LAB — TSH: TSH: 1.66 u[IU]/mL (ref 0.41–5.90)

## 2016-05-01 LAB — HEPATIC FUNCTION PANEL
ALT: 19 U/L (ref 7–35)
AST: 14 U/L (ref 13–35)
Alkaline Phosphatase: 76 U/L (ref 25–125)
Bilirubin, Total: 8 mg/dL

## 2016-05-01 LAB — HEMOGLOBIN A1C: Hemoglobin A1C: 5.9

## 2016-05-03 ENCOUNTER — Encounter: Payer: Self-pay | Admitting: Family Medicine

## 2016-05-03 ENCOUNTER — Ambulatory Visit (INDEPENDENT_AMBULATORY_CARE_PROVIDER_SITE_OTHER): Payer: 59 | Admitting: Family Medicine

## 2016-05-03 VITALS — BP 127/82 | HR 74 | Wt 257.0 lb

## 2016-05-03 DIAGNOSIS — R7301 Impaired fasting glucose: Secondary | ICD-10-CM

## 2016-05-03 DIAGNOSIS — L989 Disorder of the skin and subcutaneous tissue, unspecified: Secondary | ICD-10-CM | POA: Diagnosis not present

## 2016-05-03 DIAGNOSIS — M26609 Unspecified temporomandibular joint disorder, unspecified side: Secondary | ICD-10-CM

## 2016-05-03 MED ORDER — DIAZEPAM 5 MG PO TABS: 5.0000 mg | ORAL_TABLET | Freq: Two times a day (BID) | ORAL | 0 refills | Status: DC | PRN

## 2016-05-03 NOTE — Progress Notes (Signed)
Subjective:    CC: Left side pain  HPI: 40 year old female with a history of migraines and impaired fasting glucose and asthma comes in today complaining of pain over the left side of her upper neck behind and near the ear and going into her jaw. She also says the skin has been very itchy over the lower jaw line. Pain and soreness radiates down the neck into her left shoulder. She's been getting some intermittent numbness in the left arm. She says all this started over the weekend approximately 6 days ago. Seh reports it started with  pain that was shooting down the entire left side of her body including her calf and leg. That seems to have improved some. But she still getting the pain in the neck. She denies any recent upper respiratory symptoms. No fevers chills or sweats. She denies any allergic rhinitis symptoms. No recent injuries or trauma to the area. She did go to urgent care on October 30 and was evaluated at that time. She did have extensive blood work including a normal CBC, normal TSH, a vented cholesterol. Hemoglobin A1c of 5.9 and complete about panel that was normal. She did have a magnesium level that was just borderline elevated. Though she denies taking any magnesium supplementation. She was started on gabapentin. She did take it yesterday but says she has not taken it today. She has also noticed a bump behind her left ear that she has been scratching at.  IFG - no increased thirst or urination.   BP 127/82   Pulse 74   Wt 257 lb (116.6 kg)   SpO2 100%   BMI 42.77 kg/m     Allergies  Allergen Reactions  . Iodine Nausea And Vomiting    Pt states she is not allergic to iodine.  [place] Dye [Iodinated Diagnostic Agents] Nausea And Vomiting  . Tape     Paper tape-rips skin    Past Medical History:  Diagnosis Date  . Cyst, ovary, follicular   . Diverticulitis   . Ectopic pregnancy   . H. pylori infection   . Hx of hysterectomy partial for cervicl CA 2003  . Migraine  headache   . Sigmoidoscopy exam partial sigmoid colectomy 07-23-08    Past Surgical History:  Procedure Laterality Date  . ABDOMINAL HYSTERECTOMY    . ECTOPIC PREGNANCY SURGERY    . FOOT SURGERY    . HERNIA REPAIR    . OOPHORECTOMY    . PARTIAL HYSTERECTOMY  2003   Cervical Cancer  . partial sigmoid colectomy 07-23-08  07-23-08    Social History   Social History  . Marital status: Legally Separated    Spouse name: Brett Canales  . Number of children: 1  . Years of education: N/A   Occupational History  . Not on file.   Social History Main Topics  . Smoking status: Never Smoker  . Smokeless tobacco: Never Used  . Alcohol use No  . Drug use: No  . Sexual activity: Not on file     Comment: eremployed, lives with husband Haskell Riling, and 87 year old daughet, some college..   Other Topics Concern  . Not on file   Social History Narrative   Daily caffeine   No regular exercise.     No family history on file.  Outpatient Encounter Prescriptions as of 05/03/2016  Medication Sig  . gabapentin (NEURONTIN) 300 MG capsule Take 300 mg by mouth.  . diazepam (VALIUM) 5 MG tablet Take 1 tablet (  5 mg total) by mouth every 12 (twelve) hours as needed for anxiety.  . [DISCONTINUED] albuterol (PROVENTIL HFA;VENTOLIN HFA) 108 (90 BASE) MCG/ACT inhaler Inhale 1-2 puffs into the lungs every 4 (four) hours as needed for wheezing.  . [DISCONTINUED] budesonide-formoterol (SYMBICORT) 160-4.5 MCG/ACT inhaler Inhale 2 puffs into the lungs 2 (two) times daily.  . [DISCONTINUED] diphenhydrAMINE (BENADRYL) 25 MG tablet Take 1 tablet (25 mg total) by mouth every 6 (six) hours.  . [DISCONTINUED] diphenoxylate-atropine (LOMOTIL) 2.5-0.025 MG per tablet Take 1-2 tablets by mouth 4 (four) times daily as needed for diarrhea or loose stools.  . [DISCONTINUED] HYDROcodone-acetaminophen (NORCO/VICODIN) 5-325 MG per tablet Take 1 tablet by mouth every 4 (four) hours as needed.  . [DISCONTINUED] ibuprofen  (ADVIL,MOTRIN) 800 MG tablet Take 1 tablet (800 mg total) by mouth every 8 (eight) hours as needed for mild pain.  . [DISCONTINUED] ibuprofen (ADVIL,MOTRIN) 800 MG tablet Take 1 tablet (800 mg total) by mouth 3 (three) times daily.  . [DISCONTINUED] metoCLOPramide (REGLAN) 10 MG tablet Take 1 tablet (10 mg total) by mouth every 6 (six) hours.  . [DISCONTINUED] metroNIDAZOLE (FLAGYL) 500 MG tablet Take 1 tablet (500 mg total) by mouth 2 (two) times daily. One po bid x 7 days  . [DISCONTINUED] naproxen (NAPROSYN) 500 MG tablet Take 1 tablet (500 mg total) by mouth 2 (two) times daily.  . [DISCONTINUED] ondansetron (ZOFRAN ODT) 8 MG disintegrating tablet Take 1 tablet (8 mg total) by mouth every 8 (eight) hours as needed for nausea.  . [DISCONTINUED] oxyCODONE-acetaminophen (PERCOCET) 5-325 MG per tablet Take 2 tablets by mouth every 4 (four) hours as needed for pain.  . [DISCONTINUED] sulfamethoxazole-trimethoprim (SEPTRA DS) 800-160 MG per tablet Take 1 tablet by mouth every 12 (twelve) hours.  . [DISCONTINUED] sulfamethoxazole-trimethoprim (SEPTRA DS) 800-160 MG per tablet Take 1 tablet by mouth 2 (two) times daily.  . [DISCONTINUED] traMADol (ULTRAM) 50 MG tablet Take 1 tablet (50 mg total) by mouth every 6 (six) hours as needed.   No facility-administered encounter medications on file as of 05/03/2016.      Marland Kitchen.   Review of Systems: No fevers, chills, night sweats, weight loss, chest pain, or shortness of breath.   Objective:    General: Well Developed, well nourished, and in no acute distress.  Neuro: Alert and oriented x3, extra-ocular muscles intact, sensation grossly intact.  HEENT: Normocephalic, atraumatic , Oropharynx is clear, TMs and canals are clear bilaterally. No significant cervical lymphadenopathy. Skin: Warm and dry, no rashes. She does have a small erythematous papule approx 1.5 cm behind the upper ear. Non-tender on exam.   Cardiac: Regular rate and rhythm, no murmurs rubs or  gallops, no lower extremity edema.  Respiratory: Clear to auscultation bilaterally. Not using accessory muscles, speaking in full sentences. MSK: Neck with normal range of motion. She is tender just below the ear. She has significant popping and clicking of the TMJ joint on the left. The right is normal. Just has a lot of tenderness and tightness and palpable knots in the muscle tissue over the trapezius on the left side towards her shoulder. Bilateral shoulders with normal range of motion.   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 I do think she also has a lot of muscle spasm and tightness down over the trapezius towards the left shoulder. Take Aleve - 1 tab twice a day.    Ok to continue the gabapentin and try the valium to relax the jaw  muscles Avoid excessive chewing.   IFG - A1C was 5.9 at UC. Will get labs entered.    Scalp lesion - did encourage her to keep an eye on the lesion on her scalp. Unsure if it's just irritated dermatitis or whether or not there could actually be a small swollen lymph node underneath. Avoid scratching and irritating it and if it's not resolving them please let me know.

## 2016-05-03 NOTE — Patient Instructions (Signed)
Take Aleve - 1 tab twice a day.    Ok to continue the gabapentin and try the valium to relax the jaw muscles Avoid excessive chewing.

## 2016-05-09 ENCOUNTER — Encounter: Payer: Self-pay | Admitting: Family Medicine

## 2016-05-14 ENCOUNTER — Ambulatory Visit (INDEPENDENT_AMBULATORY_CARE_PROVIDER_SITE_OTHER): Payer: Self-pay | Admitting: Family Medicine

## 2016-05-14 ENCOUNTER — Encounter: Payer: Self-pay | Admitting: Family Medicine

## 2016-05-14 VITALS — BP 132/79 | HR 88 | Wt 259.0 lb

## 2016-05-14 DIAGNOSIS — L989 Disorder of the skin and subcutaneous tissue, unspecified: Secondary | ICD-10-CM

## 2016-05-14 DIAGNOSIS — M26609 Unspecified temporomandibular joint disorder, unspecified side: Secondary | ICD-10-CM

## 2016-05-14 DIAGNOSIS — L821 Other seborrheic keratosis: Secondary | ICD-10-CM

## 2016-05-14 NOTE — Progress Notes (Addendum)
Subjective:    CC: TMJ  HPI:  F/U TMJ - Using Aleve, Gabapentin, and valium. She is overall feeling much better. She's no longer having the pain radiating down towards her left shoulder. She says she is now getting a little popping in her right jaw although. Her dental appointment is at the end of November right before Thanksgiving.  She still has noticed a scaly lesion on her scalp on the left side behind her left ear. She says she's having hard time keeping her hands off of it and not picking it while she is working.  Past medical history, Surgical history, Family history not pertinant except as noted below, Social history, Allergies, and medications have been entered into the medical record, reviewed, and corrections made.   Review of Systems: No fevers, chills, night sweats, weight loss, chest pain, or shortness of breath.   Objective:    General: Well Developed, well nourished, and in no acute distress.  Neuro: Alert and oriented x3, extra-ocular muscles intact, sensation grossly intact.  HEENT: Normocephalic, atraumatic  Skin: Warm and dry, no rashes. She has an irritated seborrheic keratosis on her left scalp behind her ear.   Cardiac: Regular rate and rhythm, no murmurs rubs or gallops, no lower extremity edema.  Respiratory: Clear to auscultation bilaterally. Not using accessory muscles, speaking in full sentences.   Impression and Recommendations:   TMJ-significantly improved but not completely resolved. She still getting popping of the jaw when she opening closes her mouth. Make sure to keep appointment with dentist next month.Ok to continue medications and uset eh valium PRN.   Seborrheic keratosis-cryotherapy performed today. Patient tolerated well.  Cryotherapy Procedure Note  Pre-operative Diagnosis: Seborrheic keratosis  Post-operative Diagnosis: same  Locations: left    scalp behind the ear  Indications: irritation  Anesthesia: not required   Procedure  Details  Patient informed of risks (permanent scarring, infection, light or dark discoloration, bleeding, infection, weakness, numbness and recurrence of the lesion) and benefits of the procedure and verbal informed consent obtained.  The areas are treated with liquid nitrogen therapy, frozen until ice ball extended 1-2 mm beyond lesion, allowed to thaw, and treated again. The patient tolerated procedure well.  The patient was instructed on post-op care, warned that there may be blister formation, redness and pain. Recommend OTC analgesia as needed for pain.  Condition: Stable  Complications: none.  Plan: 1. Instructed to keep the area dry and covered for 24-48h and clean thereafter. 2. Warning signs of infection were reviewed.   3. Recommended that the patient use OTC acetaminophen as needed for pain.  4. Return PRN.

## 2016-05-15 MED ORDER — GABAPENTIN 300 MG PO CAPS: 300.0000 mg | ORAL_CAPSULE | Freq: Two times a day (BID) | ORAL | 0 refills | Status: DC

## 2016-05-15 NOTE — Addendum Note (Signed)
Addended by: Nani GasserMETHENEY, CATHERINE D on: 05/15/2016 08:56 AM   Modules accepted: Orders

## 2017-09-18 ENCOUNTER — Emergency Department (HOSPITAL_BASED_OUTPATIENT_CLINIC_OR_DEPARTMENT_OTHER)
Admission: EM | Admit: 2017-09-18 | Discharge: 2017-09-18 | Disposition: A | Discharge status: Home / Self Care | Payer: 59 | Attending: Emergency Medicine | Admitting: Emergency Medicine

## 2017-09-18 ENCOUNTER — Emergency Department (HOSPITAL_BASED_OUTPATIENT_CLINIC_OR_DEPARTMENT_OTHER): Payer: 59

## 2017-09-18 ENCOUNTER — Encounter (HOSPITAL_BASED_OUTPATIENT_CLINIC_OR_DEPARTMENT_OTHER): Payer: Self-pay

## 2017-09-18 ENCOUNTER — Other Ambulatory Visit: Payer: Self-pay

## 2017-09-18 DIAGNOSIS — R05 Cough: Secondary | ICD-10-CM | POA: Insufficient documentation

## 2017-09-18 DIAGNOSIS — R059 Cough, unspecified: Secondary | ICD-10-CM

## 2017-09-18 DIAGNOSIS — J45909 Unspecified asthma, uncomplicated: Secondary | ICD-10-CM | POA: Insufficient documentation

## 2017-09-18 DIAGNOSIS — M542 Cervicalgia: Secondary | ICD-10-CM | POA: Diagnosis not present

## 2017-09-18 DIAGNOSIS — J029 Acute pharyngitis, unspecified: Secondary | ICD-10-CM | POA: Diagnosis present

## 2017-09-18 LAB — RAPID STREP SCREEN (MED CTR MEBANE ONLY): STREPTOCOCCUS, GROUP A SCREEN (DIRECT): NEGATIVE

## 2017-09-18 MED ORDER — DEXAMETHASONE SODIUM PHOSPHATE 10 MG/ML IJ SOLN
10.0000 mg | Freq: Once | INTRAMUSCULAR | Status: AC
  Administered 2017-09-18: 10 mg via INTRAMUSCULAR
  Filled 2017-09-18: qty 1

## 2017-09-18 MED ORDER — DEXAMETHASONE SODIUM PHOSPHATE 10 MG/ML IJ SOLN: 10.0000 mg | Freq: Once | INTRAMUSCULAR | Status: DC

## 2017-09-18 MED ORDER — IBUPROFEN 800 MG PO TABS: 800.0000 mg | ORAL_TABLET | Freq: Three times a day (TID) | ORAL | 0 refills | Status: DC | PRN

## 2017-09-18 MED ORDER — BENZONATATE 100 MG PO CAPS: 100.0000 mg | ORAL_CAPSULE | Freq: Three times a day (TID) | ORAL | 0 refills | Status: DC

## 2017-09-18 NOTE — ED Provider Notes (Signed)
Emergency Department Provider Note   I have reviewed the triage vital signs and the nursing notes.   HISTORY  Chief Complaint Neck Pain   HPI Meredith Barnes is a 42 y.o. female presents to the emergency department for evaluation of sore throat, congestion, headache that is resolved, and cough over the last week.  The patient has been on rounds of amoxicillin and Augmentin with no relief in symptoms.  Her headache and sinus symptoms seem to have improved slightly but she is experiencing some left-sided neck discomfort and pain with sticking out her tongue.  She has an associated dry cough with no hemoptysis or productive cough.  No fevers.   Past Medical History:  Diagnosis Date  . Cyst, ovary, follicular   . Diverticulitis   . Ectopic pregnancy   . H. pylori infection   . Hx of hysterectomy partial for cervicl CA 2003  . Migraine headache   . Sigmoidoscopy exam partial sigmoid colectomy 07-23-08    Patient Active Problem List   Diagnosis Date Noted  . Edema 12/25/2013  . Pain in lower limb 12/25/2013  . Tenosynovitis of foot and ankle 12/23/2013  . IFG (impaired fasting glucose) 05/27/2012  . OVARIAN CYST 02/07/2009  . COMMON MIGRAINE 05/13/2007  . PELVIC  PAIN 10/18/2006  . HEMATURIA 09/23/2006  . ASTHMA 06/20/2006  . ECZEMA 06/20/2006  . ANEMIA, IRON DEFICIENCY, UNSPEC. 04/30/2006  . FATIGUE/MALAISE 04/30/2006    Past Surgical History:  Procedure Laterality Date  . ABDOMINAL HYSTERECTOMY    . ECTOPIC PREGNANCY SURGERY    . FOOT SURGERY    . HERNIA REPAIR    . OOPHORECTOMY    . PARTIAL HYSTERECTOMY  2003   Cervical Cancer  . partial sigmoid colectomy 07-23-08  07-23-08    Current Outpatient Rx  . Order #: [id number] Class: Historical Med  . Order #: [id number] Class: Historical Med  . Order #: [id number] Class: Print  . Order #: [id number] Class: Print    Allergies Iodine; Ivp dye [iodinated diagnostic agents]; and Tape  No family history on file.  Social  History Social History   Tobacco Use  . Smoking status: Never Smoker  . Smokeless tobacco: Never Used  Substance Use Topics  . Alcohol use: No  . Drug use: No    Review of Systems  Constitutional: Positive fever/chills Eyes: No visual changes. ENT: Positive sore throat. Positive congestion.  Cardiovascular: Denies chest pain. Respiratory: Denies shortness of breath. Positive cough.  Gastrointestinal: No abdominal pain.  No nausea, no vomiting.  No diarrhea.  No constipation. Genitourinary: Negative for dysuria. Musculoskeletal: Negative for back pain. Skin: Negative for rash. Neurological: Negative for focal weakness or numbness. Positive HA (resolved)  10-point ROS otherwise negative.  ____________________________________________   PHYSICAL EXAM:  VITAL SIGNS: ED Triage Vitals  Enc Vitals Group     BP 09/18/17 1920 (!) 128/98     Pulse Rate 09/18/17 1920 89     Resp 09/18/17 1920 20     Temp 09/18/17 1920 98.2 F (36.8 C)     Temp Source 09/18/17 1920 Oral     SpO2 09/18/17 1920 100 %     Weight 09/18/17 1921 264 lb 8.8 oz (120 kg)     Height 09/18/17 1921 5\' 2"  (1.575 m)     Pain Score 09/18/17 1916 5   Constitutional: Alert and oriented. Well appearing and in no acute distress. Eyes: Conjunctivae are normal. Head: Atraumatic. Nose: Positive congestion/rhinnorhea. Mouth/Throat: Mucous membranes are moist.  Oropharynx with mild  erythema. No tonsillar exudate. No PTA. Normal voice. Managing oral secretions.  Neck: No stridor. Positive cervical adenopathy.  Cardiovascular: Normal rate, regular rhythm. Good peripheral circulation. Grossly normal heart sounds.   Respiratory: Normal respiratory effort.  No retractions. Lungs CTAB. Gastrointestinal: Soft and nontender. No distention.  Musculoskeletal: No lower extremity tenderness nor edema. No gross deformities of extremities. Neurologic:  Normal speech and language. No gross focal neurologic deficits are  appreciated.  Skin:  Skin is warm, dry and intact. No rash noted.  ____________________________________________   LABS (all labs ordered are listed, but only abnormal results are displayed)  Labs Reviewed  RAPID STREP SCREEN (NOT AT [facility])  CULTURE, GROUP A STREP [facility])   ____________________________________________  RADIOLOGY  Dg Neck Soft Tissue  Result Date: 09/18/2017 CLINICAL DATA:  Pain and swelling left side of neck with cough EXAM: NECK SOFT TISSUES - 1+ VIEW COMPARISON:  None. FINDINGS: Prevertebral soft tissue thickness within normal limits. No retropharyngeal gas. Epiglottis within normal limits. Subglottic trachea is patent. Degenerative changes at C5-C6 and C6-C7 IMPRESSION: Negative. Electronically Signed   By: Jasmine Pang M.D.   On: 09/18/2017 20:13   Dg Chest 2 View  Result Date: 09/18/2017 CLINICAL DATA:  Cough EXAM: CHEST  2 VIEW COMPARISON:  09/30/2016 FINDINGS: The heart size and mediastinal contours are within normal limits. Both lungs are clear. The visualized skeletal structures are unremarkable. IMPRESSION: No active cardiopulmonary disease. Electronically Signed   By: Jasmine Pang M.D.   On: 09/18/2017 20:12    ____________________________________________   PROCEDURES  Procedure(s) performed:   Procedures  None ____________________________________________   INITIAL IMPRESSION / ASSESSMENT AND PLAN / ED COURSE  Pertinent labs & imaging results that were available during my care of the patient were reviewed by me and considered in my medical decision making (see chart for details).  Patient presents to the emergency department with sore throat, cough, mild congestion not responding to antibiotics. Her symptoms are most consistent with viral illness.  Rapid strep negative.  Chest x-ray and plain film of the neck reviewed and are normal. Plan for IM Decadron here along with continued supportive care at home.  At this time, I do not feel there is any  life-threatening condition present. I have reviewed and discussed all results (EKG, imaging, lab, urine as appropriate), exam findings with patient. I have reviewed nursing notes and appropriate previous records.  I feel the patient is safe to be discharged home without further emergent workup. Discussed usual and customary return precautions. Patient and family (if present) verbalize understanding and are comfortable with this plan.  Patient will follow-up with their primary care provider. If they do not have a primary care provider, information for follow-up has been provided to them. All questions have been answered.  ____________________________________________  FINAL CLINICAL IMPRESSION(S) / ED DIAGNOSES  Final diagnoses:  Neck pain  Cough     MEDICATIONS GIVEN DURING THIS VISIT:  Medications  dexamethasone (DECADRON) injection 10 mg (10 mg Intramuscular Given 09/18/17 2036)     NEW OUTPATIENT MEDICATIONS STARTED DURING THIS VISIT:  Discharge Medication List as of 09/18/2017  8:30 PM    START taking these medications   Details  benzonatate (TESSALON) 100 MG capsule Take 1 capsule (100 mg total) by mouth every 8 (eight) hours., Starting Wed 09/18/2017, Print    ibuprofen (ADVIL,MOTRIN) 800 MG tablet Take 1 tablet (800 mg total) by mouth every 8 (eight) hours as needed., Starting Wed 09/18/2017, Print  Note:  This document was prepared using Dragon voice recognition software and may include unintentional dictation errors.  Alona Bene, MD Emergency Medicine    Virl Coble, Arlyss Repress, MD 09/19/17 [id number]

## 2017-09-18 NOTE — ED Notes (Signed)
Patient transported to X-ray 

## 2017-09-18 NOTE — ED Triage Notes (Signed)
C/o pain, swelling to left side of neck-worse with cough and swallowing-was seen x 2 last week-dx with "viral infection and sinus infection"-abx started then switched by PCP-pt ND-steady gait

## 2017-09-18 NOTE — Discharge Instructions (Signed)

## 2017-09-21 LAB — CULTURE, GROUP A STREP (THRC)

## 2017-12-10 ENCOUNTER — Encounter (HOSPITAL_BASED_OUTPATIENT_CLINIC_OR_DEPARTMENT_OTHER): Payer: Self-pay | Admitting: *Deleted

## 2017-12-10 ENCOUNTER — Other Ambulatory Visit: Payer: Self-pay

## 2017-12-10 ENCOUNTER — Emergency Department (HOSPITAL_BASED_OUTPATIENT_CLINIC_OR_DEPARTMENT_OTHER)
Admission: EM | Admit: 2017-12-10 | Discharge: 2017-12-10 | Disposition: A | Discharge status: Home / Self Care | Payer: 59 | Attending: Emergency Medicine | Admitting: Emergency Medicine

## 2017-12-10 DIAGNOSIS — R519 Headache, unspecified: Secondary | ICD-10-CM

## 2017-12-10 DIAGNOSIS — Z79899 Other long term (current) drug therapy: Secondary | ICD-10-CM | POA: Diagnosis not present

## 2017-12-10 DIAGNOSIS — R51 Headache: Secondary | ICD-10-CM | POA: Insufficient documentation

## 2017-12-10 MED ORDER — DICLOFENAC SODIUM 1 % TD GEL: TRANSDERMAL | 0 refills | Status: DC

## 2017-12-10 NOTE — ED Triage Notes (Signed)
Pt reports her dermatologist took 2 biopsies of her scalp in late January to test for allopecia, since then she has had pain at both sites "it feels like it is swelling" denies any other pain or c/o

## 2017-12-10 NOTE — Discharge Instructions (Addendum)
May use the diclofenac gel for localized pain.  Apply a dime sized amount to the local area twice a day as needed.  Do not use for more than a week at a time.  It is important to stay well-hydrated.  Be sure to drink at least six 8 oz glasses of water a day.  Keep the skin of the scalp supple by using ointments, such as Aquaphor, and massage the skin throughout the day.  Follow-up with your dermatologist on this matter for any further management.

## 2017-12-10 NOTE — ED Notes (Signed)
Has felt swelling and pressure in head where she had bx done , on and off since jan ,states did see that dr 4-5 times and told him about pressure all those times but he only gave her meds for hair  Loss but not for pressure or swelling

## 2017-12-10 NOTE — ED Provider Notes (Signed)
[facility] EMERGENCY DEPARTMENT Provider Note   CSN: [id number] Arrival date & time: 12/10/17  0944     History   Chief Complaint No chief complaint on file.   HPI Meredith Barnes is a 42 y.o. female.  HPI   Meredith Barnes is a 42 y.o. female, with a history of alopecia, presenting to the ED with pressure and discomfort to the scalp since January.  States she had a biopsied performed in 2 locations for diagnosis of alopecia.  She states that the scalp has been painful and tight since that time.  She has follow-up with her dermatologist twice a month.  She has been told to take ibuprofen or Tylenol.  Denies fever/chills, nausea/vomiting, headache, swelling, or any other complaints.      Past Medical History:  Diagnosis Date  . Cyst, ovary, follicular   . Diverticulitis   . Ectopic pregnancy   . H. pylori infection   . Hx of hysterectomy partial for cervicl CA 2003  . Migraine headache   . Sigmoidoscopy exam partial sigmoid colectomy 07-23-08    Patient Active Problem List   Diagnosis Date Noted  . Edema 12/25/2013  . Pain in lower limb 12/25/2013  . Tenosynovitis of foot and ankle 12/23/2013  . IFG (impaired fasting glucose) 05/27/2012  . OVARIAN CYST 02/07/2009  . COMMON MIGRAINE 05/13/2007  . PELVIC  PAIN 10/18/2006  . HEMATURIA 09/23/2006  . ASTHMA 06/20/2006  . ECZEMA 06/20/2006  . ANEMIA, IRON DEFICIENCY, UNSPEC. 04/30/2006  . FATIGUE/MALAISE 04/30/2006    Past Surgical History:  Procedure Laterality Date  . ABDOMINAL HYSTERECTOMY    . ECTOPIC PREGNANCY SURGERY    . FOOT SURGERY    . HERNIA REPAIR    . OOPHORECTOMY    . PARTIAL HYSTERECTOMY  2003   Cervical Cancer  . partial sigmoid colectomy 07-23-08  07-23-08     OB History   None      Home Medications    Prior to Admission medications   Medication Sig Start Date End Date Taking? Authorizing Provider  cholecalciferol (VITAMIN D) 1000 units tablet Take 1,000 Units by mouth daily.    Yes [provider]  minoxidil (LONITEN) 10 MG tablet Take 10 mg by mouth daily.   Yes [provider]  Amoxicillin-Pot Clavulanate (AUGMENTIN PO) Take by mouth.    [provider]  benzonatate (TESSALON) 100 MG capsule Take 1 capsule (100 mg total) by mouth every 8 (eight) hours. 09/18/17   Long, Arlyss Repress, MD  diclofenac sodium (VOLTAREN) 1 % GEL Apply a dime-sized amount to the painful area twice daily as needed for localized pain. 12/10/17   Joy, Shawn C, PA-C  ibuprofen (ADVIL,MOTRIN) 800 MG tablet Take 1 tablet (800 mg total) by mouth every 8 (eight) hours as needed. 09/18/17   Long, Arlyss Repress, MD  UNABLE TO FIND Codeine cough med    [provider]    Family History History reviewed. No pertinent family history.  Social History Social History   Tobacco Use  . Smoking status: Never Smoker  . Smokeless tobacco: Never Used  Substance Use Topics  . Alcohol use: No  . Drug use: No     Allergies   Iodine; Ivp dye [iodinated diagnostic agents]; and Tape   Review of Systems Review of Systems  Constitutional: Negative for fever.  HENT:       Scalp pain  Gastrointestinal: Negative for nausea and vomiting.  Neurological: Negative for headaches.  Physical Exam Updated Vital Signs BP (!) 132/97 (BP Location: Right Arm)   Pulse 69   Temp 98.2 F (36.8 C) (Oral)   Resp 18   Ht  (1.651 m)   Wt 117.9 kg (260 lb)   SpO2 100%   BMI 43.27 kg/m   Physical Exam  Constitutional: She appears well-developed and well-nourished. No distress.  HENT:  Head: Normocephalic and atraumatic.  Patient has a hair piece affixed with hair glue.  She was unwilling to she states she is unable to remove it here in the ED.  She voices understanding that this hair pieced makes it so I am unable to perform a full exam.   There is no notable tenderness to the scalp.  Eyes: Conjunctivae are normal.  Neck: Neck supple.  Cardiovascular: Normal rate and regular  rhythm.  Pulmonary/Chest: Effort normal.  Neurological: She is alert.  Skin: Skin is warm and dry. She is not diaphoretic. No pallor.  Psychiatric: She has a normal mood and affect. Her behavior is normal.  Nursing note and vitals reviewed.    ED Treatments / Results  Labs (all labs ordered are listed, but only abnormal results are displayed) Labs Reviewed - No data to display  EKG None  Radiology No results found.  Procedures Procedures (including critical care time)  Medications Ordered in ED Medications - No data to display   Initial Impression / Assessment and Plan / ED Course  I have reviewed the triage vital signs and the nursing notes.  Pertinent labs & imaging results that were available during my care of the patient were reviewed by me and considered in my medical decision making (see chart for details).     Patient presents with scalp pain for several months.  She is nontoxic-appearing, afebrile, not tachycardic, and in no apparent distress.  Suspect her discomfort may be due to the tightness in the scalp skin from the biopsy.  Full exam was not able to be performed due to the patient's hairpiece glued in place.  We will try use of a topical anti-inflammatory for local pain.  She will follow-up with her dermatologist.  She was also counseled on measures to keep the skin supple, such as ointments and hydration.    Final Clinical Impressions(s) / ED Diagnoses   Final diagnoses:  Scalp pain    ED Discharge Orders        Ordered    diclofenac sodium (VOLTAREN) 1 % GEL     12/10/17 1134       Anselm Pancoast, PA-C 12/10/17 1146    Doug Sou, MD 12/10/17 1537

## 2018-09-10 ENCOUNTER — Other Ambulatory Visit: Payer: Self-pay

## 2018-09-10 ENCOUNTER — Emergency Department (HOSPITAL_BASED_OUTPATIENT_CLINIC_OR_DEPARTMENT_OTHER)
Admission: EM | Admit: 2018-09-10 | Discharge: 2018-09-10 | Disposition: A | Discharge status: Home / Self Care | Payer: 59 | Attending: Emergency Medicine | Admitting: Emergency Medicine

## 2018-09-10 ENCOUNTER — Emergency Department (HOSPITAL_BASED_OUTPATIENT_CLINIC_OR_DEPARTMENT_OTHER): Payer: 59

## 2018-09-10 ENCOUNTER — Emergency Department (HOSPITAL_COMMUNITY): Payer: 59

## 2018-09-10 ENCOUNTER — Encounter (HOSPITAL_BASED_OUTPATIENT_CLINIC_OR_DEPARTMENT_OTHER): Payer: Self-pay

## 2018-09-10 DIAGNOSIS — R29810 Facial weakness: Secondary | ICD-10-CM | POA: Insufficient documentation

## 2018-09-10 DIAGNOSIS — R202 Paresthesia of skin: Secondary | ICD-10-CM | POA: Insufficient documentation

## 2018-09-10 DIAGNOSIS — G43109 Migraine with aura, not intractable, without status migrainosus: Secondary | ICD-10-CM

## 2018-09-10 DIAGNOSIS — J45909 Unspecified asthma, uncomplicated: Secondary | ICD-10-CM | POA: Insufficient documentation

## 2018-09-10 DIAGNOSIS — Z79899 Other long term (current) drug therapy: Secondary | ICD-10-CM | POA: Insufficient documentation

## 2018-09-10 DIAGNOSIS — R200 Anesthesia of skin: Secondary | ICD-10-CM | POA: Diagnosis present

## 2018-09-10 HISTORY — DX: Nonscarring hair loss, unspecified: L65.9

## 2018-09-10 LAB — BASIC METABOLIC PANEL
Anion gap: 7 (ref 5–15)
BUN: 12 mg/dL (ref 6–20)
CHLORIDE: 106 mmol/L (ref 98–111)
CO2: 28 mmol/L (ref 22–32)
CREATININE: 0.82 mg/dL (ref 0.44–1.00)
Calcium: 9.4 mg/dL (ref 8.9–10.3)
GFR calc Af Amer: >60 mL/min (ref >60)
GFR calc non Af Amer: >60 mL/min (ref >60)
GLUCOSE: 92 mg/dL (ref 70–99)
POTASSIUM: 3.2 mmol/L — AB (ref 3.5–5.1)
SODIUM: 141 mmol/L (ref 135–145)

## 2018-09-10 LAB — CBC WITH DIFFERENTIAL/PLATELET
ABS IMMATURE GRANULOCYTES: 0.02 10*3/uL (ref 0.00–0.07)
Basophils Absolute: 0 10*3/uL (ref 0.0–0.1)
Basophils Relative: 0 %
EOS ABS: 0.1 10*3/uL (ref 0.0–0.5)
Eosinophils Relative: 1 %
HCT: 41 % (ref 36.0–46.0)
HEMOGLOBIN: 12.3 g/dL (ref 12.0–15.0)
Immature Granulocytes: 0 %
LYMPHS ABS: 3 10*3/uL (ref 0.7–4.0)
Lymphocytes Relative: 38 %
MCH: 24.1 pg — AB (ref 26.0–34.0)
MCHC: 30 g/dL (ref 30.0–36.0)
MCV: 80.2 fL (ref 80.0–100.0)
Monocytes Absolute: 0.6 10*3/uL (ref 0.1–1.0)
Monocytes Relative: 8 %
NRBC: 0 % (ref 0.0–0.2)
Neutro Abs: 4.1 10*3/uL (ref 1.7–7.7)
Neutrophils Relative %: 53 %
Platelets: 322 10*3/uL (ref 150–400)
RBC: 5.11 MIL/uL (ref 3.87–5.11)
RDW: 17.8 % — ABNORMAL HIGH (ref 11.5–15.5)
WBC: 7.8 10*3/uL (ref 4.0–10.5)

## 2018-09-10 MED ORDER — GADOBUTROL 1 MMOL/ML IV SOLN
10.0000 mL | Freq: Once | INTRAVENOUS | Status: AC | PRN
  Administered 2018-09-10: 10 mL via INTRAVENOUS

## 2018-09-10 MED ORDER — PROMETHAZINE HCL 25 MG/ML IJ SOLN
12.5000 mg | Freq: Once | INTRAMUSCULAR | Status: AC
  Administered 2018-09-10: 12.5 mg via INTRAVENOUS
  Filled 2018-09-10: qty 1

## 2018-09-10 MED ORDER — KETOROLAC TROMETHAMINE 30 MG/ML IJ SOLN
30.0000 mg | Freq: Once | INTRAMUSCULAR | Status: AC
  Administered 2018-09-10: 30 mg via INTRAVENOUS
  Filled 2018-09-10: qty 1

## 2018-09-10 NOTE — ED Notes (Signed)
Pt left the department with family  via private vehicle to [facility] ED.

## 2018-09-10 NOTE — ED Notes (Signed)
Pt returned from MRI °

## 2018-09-10 NOTE — ED Notes (Signed)
PT to MRI

## 2018-09-10 NOTE — ED Notes (Signed)
Patient verbalizes understanding of discharge instructions. Opportunity for questioning and answers were provided. Armband removed by staff, pt discharged from ED.  

## 2018-09-10 NOTE — ED Notes (Addendum)
called [place] to let them know about pt arrival via privte vehicle , pt will need MRI . Dr Freida Busman is the accepting  Provider . Asher Muir , Charge RN is aware .

## 2018-09-10 NOTE — ED Triage Notes (Signed)
Pt c/o pain to left side of face, numbness to left side of lips and left UE-sx started 2/16-none 2/17-2/18-started back today-NAD-steady gait

## 2018-09-10 NOTE — Consult Note (Signed)
NEURO HOSPITALIST CONSULT NOTE   Requestig physician: Dr. Rodena Medin  Reason for Consult: Lateralized weakness and headache  History obtained from:  Patient and Chart     HPI:                                                                                                                                          Meredith Barnes is an 43 y.o. female who presented today with a headache manifesting in the same location and with the usual type of pain which is typical for her migraine headaches, which typically occur about twice per month. This time, however, she had accompanying left perioral numbness that radiated from the location of her headache (throbbing, in the left supraorbital region) down the side of her face to the corner of her mouth. She also had sensory numbness involving the lateral aspect of her left upper arm, just distal to her shoulder. Her headache is now completely resolved after medication, but there is still some residual numbness in the above regions. She has never before had neurological symptoms with one of her headaches. She had photophobia with her headache, but no osmophobia or sonophobia.   She denies visual loss, confusion, facial weakness, trouble talking, trouble chewing, neck pain, neck stiffness, limb weakness, CP, SOB or abdominal pain.   Past Medical History:  Diagnosis Date  . Alopecia   . Cyst, ovary, follicular   . Diverticulitis   . Ectopic pregnancy   . H. pylori infection   . Hx of hysterectomy partial for cervicl CA 2003  . Migraine headache   . Sigmoidoscopy exam partial sigmoid colectomy 07-23-08    Past Surgical History:  Procedure Laterality Date  . ABDOMINAL HYSTERECTOMY    . ECTOPIC PREGNANCY SURGERY    . FOOT SURGERY    . HERNIA REPAIR    . OOPHORECTOMY    . PARTIAL HYSTERECTOMY  2003   Cervical Cancer  . partial sigmoid colectomy 07-23-08  07-23-08    No family history on file.            Social History:  reports that  she has never smoked. She has never used smokeless tobacco. She reports that she does not drink alcohol or use drugs.  Allergies  Allergen Reactions  . Iodine Nausea And Vomiting    Pt states she is not allergic to iodine.  [place] Dye [Iodinated Diagnostic Agents] Nausea And Vomiting  . Tape     Paper tape-rips skin    HOME MEDICATIONS:  ROS:                                                                                                                                       As per HPI. All other systems negative.    Blood pressure 137/87, pulse 78, temperature 98.2 F (36.8 C), temperature source Oral, resp. rate 18, height 5\' 5"  (1.651 m), weight 121.8 kg, SpO2 100 %.   General Examination:                                                                                                      Physical Exam  HEENT-  [place]/AT.    Lungs- Respirations unlabored Extremities- No edema  Neurological Examination Mental Status: Alert, fully oriented, thought content appropriate.  Speech fluent without evidence of aphasia.  Able to follow all commands without difficulty. Cranial Nerves: II: Visual fields intact without extinction to DSS. PERRL.   III,IV, VI: Ptosis not present, EOMI without nystagmus.  V,VII: smile symmetric, facial temp sensation equal bilaterally. Specifically, no sensory loss to temp in the symptomatic regions of numbness described in the HPI.  VIII: hearing intact to voice IX,X: No hypophonia XI: Symmetric shoulder shrug XII: midline tongue extension Motor: Right : Upper extremity   5/5    Left:     Upper extremity   5/5  Lower extremity   5/5     Lower extremity   5/5 No drift Sensory: Tempand light touch intact in all 4 extremities, including in the symptomatic regions of numbness described in the HPI. Deep Tendon Reflexes: 2+ and symmetric  throughout Cerebellar: No ataxia with FNF or H-S bilaterally Gait: Deferred    Lab Results: Basic Metabolic Panel: Recent Labs  Lab 09/10/18 1355  NA 141  K 3.2*  CL 106  CO2 28  GLUCOSE 92  BUN 12  CREATININE 0.82  CALCIUM 9.4    CBC: Recent Labs  Lab 09/10/18 1355  WBC 7.8  NEUTROABS 4.1  HGB 12.3  HCT 41.0  MCV 80.2  PLT 322    Cardiac Enzymes: No results for input(s): CKTOTAL, CKMB, CKMBINDEX, TROPONINI in the last 168 hours.  Lipid Panel: No results for input(s): CHOL, TRIG, HDL, CHOLHDL, VLDL, LDLCALC in the last 168 hours.  Imaging: Ct Head Wo Contrast  Result Date: 09/10/2018 CLINICAL DATA:  Headache and left sided facial pain and numbness on Sunday and today. EXAM: CT HEAD WITHOUT CONTRAST TECHNIQUE: Contiguous axial images were obtained from the base of the  skull through the vertex without intravenous contrast. COMPARISON:  04/08/2012 FINDINGS: BRAIN: The ventricles and sulci are normal. No intraparenchymal hemorrhage, mass effect nor midline shift. No acute large vascular territory infarcts. Grey-white matter distinction is maintained. The basal ganglia are unremarkable. No abnormal extra-axial fluid collections. Normal variant cavum septum pellucidum et vergae. Basal cisterns are not effaced and midline. The brainstem and cerebellar hemispheres are without acute abnormalities. VASCULAR: Unremarkable. SKULL/SOFT TISSUES: No skull fracture. No significant soft tissue swelling. ORBITS/SINUSES: The included ocular globes and orbital contents are normal.The mastoid air cells are clear. The included paranasal sinuses are well-aerated. OTHER: None. IMPRESSION: Normal head CT. Electronically Signed   By: Tollie Eth M.D.   On: 09/10/2018 14:42   Mr Brain W And Wo Contrast  Result Date: 09/10/2018 CLINICAL DATA:  Migraines, LEFT face and LEFT arm paresthesias. EXAM: MRI HEAD WITHOUT AND WITH CONTRAST TECHNIQUE: Multiplanar, multiecho pulse sequences of the brain and  surrounding structures were obtained without and with intravenous contrast. CONTRAST:  10 cc Gadavist COMPARISON:  CT HEAD September 10, 2018 and MRI head April 21, 2012 FINDINGS: Mild motion degraded examination. INTRACRANIAL CONTENTS: No reduced diffusion to suggest acute ischemia or hyperacute demyelination. No susceptibility artifact to suggest hemorrhage. No parenchymal brain volume loss for age; cavum septum pellucidum et vergae. Similar punctate supratentorial white matter FLAIR T2 hyperintensities, nonspecific. No suspicious parenchymal signal, masses, mass effect. RIGHT parietal developmental venous anomaly. No abnormal intraparenchymal or extra-axial enhancement. No abnormal extra-axial fluid collections. No extra-axial masses. VASCULAR: Normal major intracranial vascular flow voids present at skull base. SKULL AND UPPER CERVICAL SPINE: No abnormal sellar expansion. No suspicious calvarial bone marrow signal. Craniocervical junction maintained. SINUSES/ORBITS: The mastoid air-cells and included paranasal sinuses are well-aerated.The included ocular globes and orbital contents are non-suspicious. OTHER: None. IMPRESSION: 1. Negative motion degraded MRI head with and without contrast. Electronically Signed   By: Awilda Metro M.D.   On: 09/10/2018 18:17    Assessment: 43 year old female presenting with symptoms most consistent with complicated migraine headache 1. Headache pain now resolved.  2. Intensity of sensory symptoms has decreased significantly. No objective sensory loss on exam.  3. MRI brain normal.   Recommendations: 1. From a neurological standpoint, can discharge home with outpatient follow up for migraine headache management.  2. Keep well hydrated, get plenty of rest, obtain required amount of sleep nightly, stress reduction, exercise, stretching and regular meals to decrease the frequency and intensity of headaches.    Electronically signed: Dr. Caryl Pina 09/10/2018,  7:20 PM

## 2018-09-10 NOTE — ED Provider Notes (Addendum)
[facility] EMERGENCY DEPARTMENT Provider Note   CSN: [id number] Arrival date & time: 09/10/18  1312    History   Chief Complaint Chief Complaint  Patient presents with  . Numbness    HPI Meredith Barnes is a 43 y.o. female.     HPI Patient presents with paresthesias to left face and left arm.  Had episodes with a headache 3 days ago.  Has a history of migraines and states the headache felt like a migraine.  States it went away but then started up again today.  Has less of a headache but does have the paresthesias that came back.  Still some headache.  No vision changes.  States it feels as if she slept wrong on her arm and face.  No vision changes.  No confusion.  No fever.  No trauma.  States she has had migraines previously and sees a neurologist but more for her alopecia.  States it was because of pain in her head she was getting an injection.No difficulty moving the extremity. Past Medical History:  Diagnosis Date  . Alopecia   . Cyst, ovary, follicular   . Diverticulitis   . Ectopic pregnancy   . H. pylori infection   . Hx of hysterectomy partial for cervicl CA 2003  . Migraine headache   . Sigmoidoscopy exam partial sigmoid colectomy 07-23-08    Patient Active Problem List   Diagnosis Date Noted  . Edema 12/25/2013  . Pain in lower limb 12/25/2013  . Tenosynovitis of foot and ankle 12/23/2013  . IFG (impaired fasting glucose) 05/27/2012  . OVARIAN CYST 02/07/2009  . COMMON MIGRAINE 05/13/2007  . PELVIC  PAIN 10/18/2006  . HEMATURIA 09/23/2006  . ASTHMA 06/20/2006  . ECZEMA 06/20/2006  . ANEMIA, IRON DEFICIENCY, UNSPEC. 04/30/2006  . FATIGUE/MALAISE 04/30/2006    Past Surgical History:  Procedure Laterality Date  . ABDOMINAL HYSTERECTOMY    . ECTOPIC PREGNANCY SURGERY    . FOOT SURGERY    . HERNIA REPAIR    . OOPHORECTOMY    . PARTIAL HYSTERECTOMY  2003   Cervical Cancer  . partial sigmoid colectomy 07-23-08  07-23-08     OB History   No  obstetric history on file.      Home Medications    Prior to Admission medications   Medication Sig Start Date End Date Taking? Authorizing Provider  Amoxicillin-Pot Clavulanate (AUGMENTIN PO) Take by mouth.    [provider]  benzonatate (TESSALON) 100 MG capsule Take 1 capsule (100 mg total) by mouth every 8 (eight) hours. 09/18/17   Long, Arlyss RepressJoshua G, MD  cholecalciferol (VITAMIN D) 1000 units tablet Take 1,000 Units by mouth daily.    [provider]  diclofenac sodium (VOLTAREN) 1 % GEL Apply a dime-sized amount to the painful area twice daily as needed for localized pain. 12/10/17   Joy, Shawn C, PA-C  ibuprofen (ADVIL,MOTRIN) 800 MG tablet Take 1 tablet (800 mg total) by mouth every 8 (eight) hours as needed. 09/18/17   Long, Arlyss RepressJoshua G, MD  minoxidil (LONITEN) 10 MG tablet Take 10 mg by mouth daily.    [provider]  UNABLE TO FIND Codeine cough med    [provider]    Family History No family history on file.  Social History Social History   Tobacco Use  . Smoking status: Never Smoker  . Smokeless tobacco: Never Used  Substance Use Topics  . Alcohol use: No  . Drug use: No  Allergies   Iodine; Ivp dye [iodinated diagnostic agents]; and Tape   Review of Systems Review of Systems  Constitutional: Negative for appetite change.  HENT: Negative for congestion.   Eyes: Negative for visual disturbance.  Respiratory: Negative for shortness of breath.   Gastrointestinal: Negative for abdominal pain.  Genitourinary: Negative for flank pain.  Musculoskeletal: Negative for back pain.  Neurological: Positive for numbness and headaches. Negative for speech difficulty and weakness.  Psychiatric/Behavioral: Negative for confusion.     Physical Exam Updated Vital Signs BP (!) 147/92 (BP Location: Right Arm)   Pulse 84   Temp 98.3 F (36.8 C) (Oral)   Resp 16   Ht 5\' 5"  (1.651 m)   Wt 121.8 kg   SpO2 100%   BMI 44.68 kg/m    Physical Exam HENT:     Head: Atraumatic.     Comments: Asymmetric smile with less movement on the left lower face. Eyes:     Pupils: Pupils are equal, round, and reactive to light.  Neck:     Musculoskeletal: Neck supple.  Cardiovascular:     Rate and Rhythm: Normal rate and regular rhythm.  Pulmonary:     Effort: Pulmonary effort is normal.  Abdominal:     Palpations: Abdomen is soft.  Musculoskeletal:        General: No tenderness.     Right lower leg: No edema.     Left lower leg: No edema.  Skin:    General: Skin is warm.  Neurological:     Mental Status: She is alert.     Comments: Eye move is intact.  Visual fields grossly intact.  Good eyebrow raise bilaterally.  However less smile on left side.  Paresthesias to left face.  Paresthesias also to left upper extremity.  Good grip strength however good movement to left upper extremity.  Good movement of bilateral lower extremities.  Psychiatric:        Mood and Affect: Mood normal.      ED Treatments / Results  Labs (all labs ordered are listed, but only abnormal results are displayed) Labs Reviewed  CBC WITH DIFFERENTIAL/PLATELET  BASIC METABOLIC PANEL    EKG EKG Interpretation  Date/Time:  Wednesday September 10 2018 13:37:14 EST Ventricular Rate:  72 PR Interval:    QRS Duration: 116 QT Interval:  416 QTC Calculation: 456 R Axis:   -24 Text Interpretation:  Sinus rhythm Incomplete right bundle branch block Probable left ventricular hypertrophy Confirmed by Benjiman Core [id number]) on 09/10/2018 2:31:00 PM   Radiology Ct Head Wo Contrast  Result Date: 09/10/2018 CLINICAL DATA:  Headache and left sided facial pain and numbness on Sunday and today. EXAM: CT HEAD WITHOUT CONTRAST TECHNIQUE: Contiguous axial images were obtained from the base of the skull through the vertex without intravenous contrast. COMPARISON:  04/08/2012 FINDINGS: BRAIN: The ventricles and sulci are normal. No intraparenchymal  hemorrhage, mass effect nor midline shift. No acute large vascular territory infarcts. Grey-white matter distinction is maintained. The basal ganglia are unremarkable. No abnormal extra-axial fluid collections. Normal variant cavum septum pellucidum et vergae. Basal cisterns are not effaced and midline. The brainstem and cerebellar hemispheres are without acute abnormalities. VASCULAR: Unremarkable. SKULL/SOFT TISSUES: No skull fracture. No significant soft tissue swelling. ORBITS/SINUSES: The included ocular globes and orbital contents are normal.The mastoid air cells are clear. The included paranasal sinuses are well-aerated. OTHER: None. IMPRESSION: Normal head CT. Electronically Signed   By: Tollie Eth M.D.   On: 09/10/2018 14:42  Procedures Procedures (including critical care time)  Medications Ordered in ED Medications  ketorolac (TORADOL) 30 MG/ML injection 30 mg (30 mg Intravenous Given 09/10/18 1352)  promethazine (PHENERGAN) injection 12.5 mg (12.5 mg Intravenous Given 09/10/18 1352)     Initial Impression / Assessment and Plan / ED Course  I have reviewed the triage vital signs and the nursing notes.  Pertinent labs & imaging results that were available during my care of the patient were reviewed by me and considered in my medical decision making (see chart for details).        Patient with headache a couple days ago and had some neurologic findings with it.  Has a history of migraines but not necessarily complicated migraines.  Today had return of the numbness to her face and left upper extremity.  However does have a left lower facial droop but does not clearly have forehead involvement.  Patient states she has not had CAT scan or MRIs previously but reviewing records she has had an MRI around 9 years ago that showed nonspecific changes that could be due to migraines or less likely demyelinating process.  Potentially this could be a complicated migraine.  Will treat as a migraine  and if symptoms resolve I think likely does not need further work-up but if no improvement in the face and paresthesias potentially would require MRI.  Patient somewhat sleepy after getting the medicines.  However still has left lower facial droop and paresthesias.  This point I think it is worth transfer for an MRI with and without contrast.  If negative potentially discharge home but if positive could need further work-up.  Discussed with Dr. Freida Busman at [facility].  Patient and her sister deciding if they would like to go by ambulance but states they would rather go by private vehicle.  Final Clinical Impressions(s) / ED Diagnoses   Final diagnoses:  Paresthesia  Facial droop    ED Discharge Orders    None       Benjiman Core, MD 09/10/18 1413    Benjiman Core, MD 09/10/18 1447    Benjiman Core, MD 09/10/18 1451

## 2018-09-10 NOTE — ED Provider Notes (Signed)
[facility]ENC[facility] Provider Note   CSN: [id number] Arrival date & time: 09/10/18  1312  History   Chief Complaint Chief Complaint  Patient presents with  . Numbness/MCHP/MRI    HPI Renne C Guinea[place] is a 43 y.o. female with past medical history significant for migraine headaches who presents for evaluation of paresthesias and weakness to left face and left arm.  Patient states she has had a headache x3 days.  Has history of migraine headaches and states that her original headache felt like her previous migraines.  States that this went away, however returned this morning.  Her head pain a 4/10.  Pain does not radiate.  States she has had weakness, numbness and tingling to her left face and left arm.  States it feels like she slept wrong on this, however has not resolved.  Denies fever, chills, vision changes, nausea, vomiting, recent trauma or recent injury, neck pain, neck stiffness chest pain, shortness of breath, abdominal pain, decreased range of motion.  [facility] for further evaluation.  History obtained from patient and family member.  No interpreter was used.     HPI  Past Medical History:  Diagnosis Date  . Alopecia   . Cyst, ovary, follicular   . Diverticulitis   . Ectopic pregnancy   . H. pylori infection   . Hx of hysterectomy partial for cervicl CA 2003  . Migraine headache   . Sigmoidoscopy exam partial sigmoid colectomy 07-23-08    Patient Active Problem List   Diagnosis Date Noted  . Edema 12/25/2013  . Pain in lower limb 12/25/2013  . Tenosynovitis of foot and ankle 12/23/2013  . IFG (impaired fasting glucose) 05/27/2012  . OVARIAN CYST 02/07/2009  . COMMON MIGRAINE 05/13/2007  . PELVIC  PAIN 10/18/2006  . HEMATURIA 09/23/2006  . ASTHMA 06/20/2006  . ECZEMA 06/20/2006  . ANEMIA, IRON DEFICIENCY, UNSPEC. 04/30/2006  . FATIGUE/MALAISE 04/30/2006    Past Surgical History:    Procedure Laterality Date  . ABDOMINAL HYSTERECTOMY    . ECTOPIC PREGNANCY SURGERY    . FOOT SURGERY    . HERNIA REPAIR    . OOPHORECTOMY    . PARTIAL HYSTERECTOMY  2003   Cervical Cancer  . partial sigmoid colectomy 07-23-08  07-23-08     OB History   No obstetric history on file.      Home Medications    Prior to Admission medications   Medication Sig Start Date End Date Taking? Authorizing Provider  Amoxicillin-Pot Clavulanate (AUGMENTIN PO) Take by mouth.    [provider]  benzonatate (TESSALON) 100 MG capsule Take 1 capsule (100 mg total) by mouth every 8 (eight) hours. 09/18/17   Long, Arlyss RepressJoshua G, MD  cholecalciferol (VITAMIN D) 1000 units tablet Take 1,000 Units by mouth daily.    [provider]  diclofenac sodium (VOLTAREN) 1 % GEL Apply a dime-sized amount to the painful area twice daily as needed for localized pain. 12/10/17   Joy, Shawn C, PA-C  ibuprofen (ADVIL,MOTRIN) 800 MG tablet Take 1 tablet (800 mg total) by mouth every 8 (eight) hours as needed. 09/18/17   Long, Arlyss RepressJoshua G, MD  minoxidil (LONITEN) 10 MG tablet Take 10 mg by mouth daily.    [provider]  UNABLE TO FIND Codeine cough med    [provider]    Family History No family history on file.  Social History Social History  Tobacco Use  . Smoking status: Never Smoker  . Smokeless tobacco: Never Used  Substance Use Topics  . Alcohol use: No  . Drug use: No     Allergies   Iodine; Ivp dye [iodinated diagnostic agents]; and Tape   Review of Systems Review of Systems  Constitutional: Negative.   HENT: Negative.   Respiratory: Negative.   Cardiovascular: Negative.   Gastrointestinal: Negative.   Genitourinary: Negative.   Musculoskeletal: Negative.   Skin: Negative.   Neurological: Positive for facial asymmetry, weakness, numbness and headaches. Negative for dizziness, tremors, seizures, syncope, speech difficulty and light-headedness.  All other systems  reviewed and are negative.    Physical Exam Updated Vital Signs BP 137/87 (BP Location: Left Arm)   Pulse 78   Temp 98.2 F (36.8 C) (Oral)   Resp 18   Ht 5\' 5"  (1.651 m)   Wt 121.8 kg   SpO2 100%   BMI 44.68 kg/m   Physical Exam  Physical Exam  Constitutional: Pt is oriented to person, place, and time. Pt appears well-developed and well-nourished. No distress.  HENT:  Head: Normocephalic and atraumatic.  Mouth/Throat: Oropharynx is clear and moist.  Eyes: Conjunctivae and EOM are normal. Pupils are equal, round, and reactive to light. No scleral icterus.  No horizontal, vertical or rotational nystagmus  Neck: Normal range of motion. Neck supple.  Full active and passive ROM without pain No midline or paraspinal tenderness No nuchal rigidity or meningeal signs  Cardiovascular: Normal rate, regular rhythm and intact distal pulses.   Pulmonary/Chest: Effort normal and breath sounds normal. No respiratory distress. Pt has no wheezes. No rales.  Abdominal: Soft. Bowel sounds are normal. There is no tenderness. There is no rebound and no guarding.  Musculoskeletal: Normal range of motion.  Lymphadenopathy:    No cervical adenopathy.  Neurological: Pt. is alert and oriented to person, place, and time. He has normal reflexes. No cranial nerve deficit.  Exhibits normal muscle tone. Coordination normal.  Mental Status:  Alert, oriented, thought content appropriate. Speech fluent without evidence of aphasia. Able to follow 2 step commands without difficulty.  Cranial Nerves:  II:  Peripheral visual fields grossly normal, pupils equal, round, reactive to light III,IV, VI: ptosis not present, extra-ocular motions intact bilaterally  V,VII: smile asymmetric with sided lower facial droop.  No right involvement.  Paresthesias to left lower quadrant of face. VIII: hearing grossly normal bilaterally  IX,X: midline uvula rise  XI: bilateral shoulder shrug equal and strong XII: midline  tongue extension  Motor:  5/5 in lower extremities bilaterally, dorsiflexion/plantar flexion.4/5 grip strength to upper extremity, 5/5 grip strength to right upper extremity.  Full range of motion without difficulty. Sensory: Pinprick and light touch normal in all extremities.  Deep Tendon Reflexes: 2+ and symmetric  Cerebellar: normal finger-to-nose with bilateral upper extremities Gait: normal gait and balance CV: distal pulses palpable throughout   Skin: Skin is warm and dry. No rash noted. Pt is not diaphoretic.  Psychiatric: Pt has a normal mood and affect. Behavior is normal. Judgment and thought content normal.  Nursing note and vitals reviewed. ED Treatments / Results  Labs (all labs ordered are listed, but only abnormal results are displayed) Labs Reviewed  CBC WITH DIFFERENTIAL/PLATELET - Abnormal; Notable for the following components:      Result Value   MCH 24.1 (*)    RDW 17.8 (*)    All other components within normal limits  BASIC METABOLIC PANEL - Abnormal; Notable for the  following components:   Potassium 3.2 (*)    All other components within normal limits    EKG EKG Interpretation  Date/Time:  Wednesday September 10 2018 13:37:14 EST Ventricular Rate:  72 PR Interval:    QRS Duration: 116 QT Interval:  416 QTC Calculation: 456 R Axis:   -24 Text Interpretation:  Sinus rhythm Incomplete right bundle branch block Probable left ventricular hypertrophy Confirmed by Benjiman Core [phone]) on 09/10/2018 2:31:00 PM   Radiology Ct Head Wo Contrast  Result Date: 09/10/2018 CLINICAL DATA:  Headache and left sided facial pain and numbness on Sunday and today. EXAM: CT HEAD WITHOUT CONTRAST TECHNIQUE: Contiguous axial images were obtained from the base of the skull through the vertex without intravenous contrast. COMPARISON:  04/08/2012 FINDINGS: BRAIN: The ventricles and sulci are normal. No intraparenchymal hemorrhage, mass effect nor midline shift. No acute large  vascular territory infarcts. Grey-white matter distinction is maintained. The basal ganglia are unremarkable. No abnormal extra-axial fluid collections. Normal variant cavum septum pellucidum et vergae. Basal cisterns are not effaced and midline. The brainstem and cerebellar hemispheres are without acute abnormalities. VASCULAR: Unremarkable. SKULL/SOFT TISSUES: No skull fracture. No significant soft tissue swelling. ORBITS/SINUSES: The included ocular globes and orbital contents are normal.The mastoid air cells are clear. The included paranasal sinuses are well-aerated. OTHER: None. IMPRESSION: Normal head CT. Electronically Signed   By: Tollie Eth M.D.   On: 09/10/2018 14:42   Mr Brain W And Wo Contrast  Result Date: 09/10/2018 CLINICAL DATA:  Migraines, LEFT face and LEFT arm paresthesias. EXAM: MRI HEAD WITHOUT AND WITH CONTRAST TECHNIQUE: Multiplanar, multiecho pulse sequences of the brain and surrounding structures were obtained without and with intravenous contrast. CONTRAST:  10 cc Gadavist COMPARISON:  CT HEAD September 10, 2018 and MRI head April 21, 2012 FINDINGS: Mild motion degraded examination. INTRACRANIAL CONTENTS: No reduced diffusion to suggest acute ischemia or hyperacute demyelination. No susceptibility artifact to suggest hemorrhage. No parenchymal brain volume loss for age; cavum septum pellucidum et vergae. Similar punctate supratentorial white matter FLAIR T2 hyperintensities, nonspecific. No suspicious parenchymal signal, masses, mass effect. RIGHT parietal developmental venous anomaly. No abnormal intraparenchymal or extra-axial enhancement. No abnormal extra-axial fluid collections. No extra-axial masses. VASCULAR: Normal major intracranial vascular flow voids present at skull base. SKULL AND UPPER CERVICAL SPINE: No abnormal sellar expansion. No suspicious calvarial bone marrow signal. Craniocervical junction maintained. SINUSES/ORBITS: The mastoid air-cells and included paranasal  sinuses are well-aerated.The included ocular globes and orbital contents are non-suspicious. OTHER: None. IMPRESSION: 1. Negative motion degraded MRI head with and without contrast. Electronically Signed   By: Awilda Metro M.D.   On: 09/10/2018 18:17    Procedures Procedures (including critical care time)  Medications Ordered in ED Medications  ketorolac (TORADOL) 30 MG/ML injection 30 mg (30 mg Intravenous Given 09/10/18 1352)  promethazine (PHENERGAN) injection 12.5 mg (12.5 mg Intravenous Given 09/10/18 1352)  gadobutrol (GADAVIST) 1 MMOL/ML injection 10 mL (10 mLs Intravenous Contrast Given 09/10/18 1759)   Initial Impression / Assessment and Plan / ED Course  I have reviewed the triage vital signs and the nursing notes.  Pertinent labs & imaging results that were available during my care of the patient were reviewed by me and considered in my medical decision making (see chart for details).  43 year old female presents for evaluation of paresthesias.  Afebrile, nonseptic, non-ill-appearing.  Patient seen at [facility] and was sent to visit to emergency department for evaluation by MRI.  Has had intermittent paresthesias  and weakness to left lower quadrant of face and left upper extremity onset 3 days ago.  Symptoms resolved, however returned today.  Does have history of migraine headaches and has had headache for 3 days.  With negative head CT.  CBC without leukocytosis, metabolic panel with mild hypokalemia 3.2, no additional electrolyte, renal or liver abnormalities.  Patient does have decreased grip strength 4/5 to left upper extremity, 5/5 to right upper extremity.  She does have left-sided facial droop which involves right lower quadrant, asymmetric smile.  No forehead involvement.  Had pain currently a 4/10.  Will obtain MR brain with and without and reevaluate.  1815: MR negative. Will consult with neurology for evaluation.  1830: Consulted with Dr. Amada JupiterKirkpatrick,  neurology will assess patient.  2015: Patient evaluated Dr. Otelia LimesLindzen, neurology, feels patient exam more consistent with migraine.  Patient can be discharged and [facility].  Hemodynamically stable and appropriate for DC home at this time.  Low suspicion for acute emergent pathology at this time that would require inpatient management or further work-up in the emergency department.  I have discussed return precautions.  Patient voiced understanding and is agreeable to follow-up.     Final Clinical Impressions(s) / ED Diagnoses   Final diagnoses:  Paresthesia  Facial droop    ED Discharge Orders    None       Henderly, Britni A, PA-C 09/10/18 2022    Wynetta FinesMessick, Peter C, MD 09/18/18 [phone]

## 2018-09-10 NOTE — Discharge Instructions (Addendum)
You were evaluated today for facial droop and headache.  Your imaging as well as lab work was unremarkable.  Follow-up with [facility] neurology.  Return to ED for any worsening symptoms.

## 2019-12-17 ENCOUNTER — Emergency Department (HOSPITAL_BASED_OUTPATIENT_CLINIC_OR_DEPARTMENT_OTHER): Payer: No Typology Code available for payment source

## 2019-12-17 ENCOUNTER — Emergency Department (HOSPITAL_BASED_OUTPATIENT_CLINIC_OR_DEPARTMENT_OTHER)
Admission: EM | Admit: 2019-12-17 | Discharge: 2019-12-17 | Disposition: A | Discharge status: Home / Self Care | Payer: No Typology Code available for payment source | Attending: Emergency Medicine | Admitting: Emergency Medicine

## 2019-12-17 ENCOUNTER — Encounter (HOSPITAL_BASED_OUTPATIENT_CLINIC_OR_DEPARTMENT_OTHER): Payer: Self-pay | Admitting: Emergency Medicine

## 2019-12-17 ENCOUNTER — Other Ambulatory Visit: Payer: Self-pay

## 2019-12-17 DIAGNOSIS — M5489 Other dorsalgia: Secondary | ICD-10-CM | POA: Diagnosis present

## 2019-12-17 DIAGNOSIS — J45909 Unspecified asthma, uncomplicated: Secondary | ICD-10-CM | POA: Insufficient documentation

## 2019-12-17 DIAGNOSIS — Z79899 Other long term (current) drug therapy: Secondary | ICD-10-CM | POA: Diagnosis not present

## 2019-12-17 DIAGNOSIS — M546 Pain in thoracic spine: Secondary | ICD-10-CM

## 2019-12-17 MED ORDER — KETOROLAC TROMETHAMINE 30 MG/ML IJ SOLN
30.0000 mg | Freq: Once | INTRAMUSCULAR | Status: AC
  Administered 2019-12-17: 30 mg via INTRAMUSCULAR
  Filled 2019-12-17: qty 1

## 2019-12-17 MED ORDER — CYCLOBENZAPRINE HCL 10 MG PO TABS: 10.0000 mg | ORAL_TABLET | Freq: Two times a day (BID) | ORAL | 0 refills | Status: DC | PRN

## 2019-12-17 NOTE — ED Provider Notes (Signed)
[facility] EMERGENCY DEPARTMENT Provider Note   CSN: [id number] Arrival date & time: 12/17/19  7824     History Chief Complaint  Patient presents with  . Back Pain    Meredith Barnes is a 44 y.o. female.  The history is provided by the patient.  Back Pain Location:  Thoracic spine Quality:  Aching Radiates to:  Does not radiate Pain severity:  Mild Onset quality:  Gradual Timing:  Intermittent Progression:  Waxing and waning Chronicity:  New Context: physical stress and twisting   Relieved by:  NSAIDs Worsened by:  Palpation and movement Associated symptoms: no abdominal pain, no abdominal swelling, no bladder incontinence, no bowel incontinence, no chest pain, no dysuria, no fever, no headaches, no leg pain, no numbness, no paresthesias, no pelvic pain, no perianal numbness, no tingling and no weakness        Past Medical History:  Diagnosis Date  . Alopecia   . Cyst, ovary, follicular   . Diverticulitis   . Ectopic pregnancy   . H. pylori infection   . Hx of hysterectomy partial for cervicl CA 2003  . Migraine headache   . Sigmoidoscopy exam partial sigmoid colectomy 07-23-08    Patient Active Problem List   Diagnosis Date Noted  . Edema 12/25/2013  . Pain in lower limb 12/25/2013  . Tenosynovitis of foot and ankle 12/23/2013  . IFG (impaired fasting glucose) 05/27/2012  . OVARIAN CYST 02/07/2009  . COMMON MIGRAINE 05/13/2007  . PELVIC  PAIN 10/18/2006  . HEMATURIA 09/23/2006  . ASTHMA 06/20/2006  . ECZEMA 06/20/2006  . ANEMIA, IRON DEFICIENCY, UNSPEC. 04/30/2006  . FATIGUE/MALAISE 04/30/2006    Past Surgical History:  Procedure Laterality Date  . ABDOMINAL HYSTERECTOMY    . ECTOPIC PREGNANCY SURGERY    . FOOT SURGERY    . HERNIA REPAIR    . OOPHORECTOMY    . PARTIAL HYSTERECTOMY  2003   Cervical Cancer  . partial sigmoid colectomy 07-23-08  07-23-08     OB History   No obstetric history on file.     No family history on file.   Social History   Tobacco Use  . Smoking status: Never Smoker  . Smokeless tobacco: Never Used  Substance Use Topics  . Alcohol use: No  . Drug use: No    Home Medications Prior to Admission medications   Medication Sig Start Date End Date Taking? Authorizing Provider  cyclobenzaprine (FLEXERIL) 10 MG tablet Take 1 tablet (10 mg total) by mouth 2 (two) times daily as needed for muscle spasms. 12/17/19   Curatolo, Adam, DO  pravastatin (PRAVACHOL) 10 MG tablet SMARTSIG:1 Tablet(s) By Mouth Every Evening 07/20/19   [provider]  Vitamin D, Ergocalciferol, (DRISDOL) 1.25 MG (50000 UNIT) CAPS capsule Take 50,000 Units by mouth once a week. 07/14/19   [provider]    Allergies    Iodine, Ivp dye [iodinated diagnostic agents], and Tape  Review of Systems   Review of Systems  Constitutional: Negative for chills and fever.  HENT: Negative for ear pain and sore throat.   Eyes: Negative for pain and visual disturbance.  Respiratory: Negative for cough and shortness of breath.   Cardiovascular: Negative for chest pain and palpitations.  Gastrointestinal: Negative for abdominal pain, bowel incontinence and vomiting.  Genitourinary: Negative for bladder incontinence, dysuria, hematuria and pelvic pain.  Musculoskeletal: Positive for back pain. Negative for arthralgias.  Skin: Negative for color change and rash.  Neurological: Negative for tingling, seizures,  syncope, weakness, numbness, headaches and paresthesias.       Left arm parathesias   All other systems reviewed and are negative.   Physical Exam Updated Vital Signs  ED Triage Vitals  Enc Vitals Group     BP 12/17/19 0819 (!) 149/98     Pulse Rate 12/17/19 0819 75     Resp 12/17/19 0819 16     Temp 12/17/19 0819 98.6 F (37 C)     Temp Source 12/17/19 0819 Oral     SpO2 12/17/19 0819 99 %     Weight 12/17/19 0820 269 lb 9.6 oz (122.3 kg)     Height 12/17/19 0820 5\' 5"  (1.651 m)     Head  Circumference --      Peak Flow --      Pain Score 12/17/19 0820 8     Pain Loc --      Pain Edu? --      Excl. in GC? --     Physical Exam Vitals and nursing note reviewed.  Constitutional:      General: She is not in acute distress.    Appearance: She is well-developed. She is not ill-appearing.  HENT:     Head: Normocephalic and atraumatic.     Nose: Nose normal.  Eyes:     Extraocular Movements: Extraocular movements intact.     Conjunctiva/sclera: Conjunctivae normal.     Pupils: Pupils are equal, round, and reactive to light.  Cardiovascular:     Rate and Rhythm: Normal rate and regular rhythm.     Pulses: Normal pulses.     Heart sounds: No murmur.  Pulmonary:     Effort: Pulmonary effort is normal. No respiratory distress.     Breath sounds: Normal breath sounds.  Abdominal:     Palpations: Abdomen is soft.     Tenderness: There is no abdominal tenderness.  Musculoskeletal:        General: Tenderness present. Normal range of motion.     Cervical back: Neck supple.     Comments: Tenderness to left-sided posterior ribs and in the subscapularis area, tenderness in the upper back with range of motion of the left shoulder  Skin:    General: Skin is warm and dry.  Neurological:     General: No focal deficit present.     Mental Status: She is alert and oriented to person, place, and time.     Cranial Nerves: No cranial nerve deficit.     Sensory: No sensory deficit.     Motor: No weakness.     Coordination: Coordination normal.     Gait: Gait normal.     Comments: 5+ out of 5 strength throughout, normal sensation, no drift     ED Results / Procedures / Treatments   Labs (all labs ordered are listed, but only abnormal results are displayed) Labs Reviewed - No data to display  EKG EKG Interpretation  Date/Time:  Thursday Dec 17 2019 08:50:27 EDT Ventricular Rate:  70 PR Interval:    QRS Duration: 113 QT Interval:  392 QTC Calculation: 423 R Axis:   -36  Text Interpretation: Sinus rhythm Abnormal R-wave progression, early transition Left ventricular hypertrophy Confirmed by 05-25-2001 [id number]) on 12/17/2019 9:12:51 AM   Radiology DG Chest 2 View  Result Date: 12/17/2019 CLINICAL DATA:  Left-sided chest pain for several days, no known injury, initial encounter EXAM: CHEST - 2 VIEW COMPARISON:  10/03/2017 FINDINGS: The heart size and mediastinal contours are within  normal limits. Both lungs are clear. The visualized skeletal structures are unremarkable. IMPRESSION: No active cardiopulmonary disease. Electronically Signed   By: Alcide Clever M.D.   On: 12/17/2019 09:03    Procedures Procedures (including critical care time)  Medications Ordered in ED Medications  ketorolac (TORADOL) 30 MG/ML injection 30 mg (30 mg Intramuscular Given 12/17/19 0902)    ED Course  I have reviewed the triage vital signs and the nursing notes.  Pertinent labs & imaging results that were available during my care of the patient were reviewed by me and considered in my medical decision making (see chart for details).    MDM Rules/Calculators/A&P                      Julena C [place] is a 44 year old female with no significant medical history presents to the ED with left-sided upper back pain.  Patient with normal vitals.  No fever.  No chest pain, no shortness of breath.  Tenderness to the left subscapularis area, left posterior ribs.  Worse with range of motion of the left shoulder.  Denies any trauma.  No cough, no sputum production.  EKG shows sinus rhythm.  No ischemic changes.  No chest pain and doubt cardiac process. Patient is PERC negative and doubt PE.  Patient with muscle related pain I believe.  Chest x-ray showed no signs of infection or pneumothorax or rib fracture.  She has had some paresthesias on the left arm.  However she has good neurological exam.  Good pulses in the upper extremities.  No concern for dissection.  Will prescribe Flexeril.   Recommend Tylenol, Motrin.  Given a Toradol shot and discharged in the ED in good condition.  Recommend follow-up with primary care doctor and given return precautions.  This chart was dictated using voice recognition software.  Despite best efforts to proofread,  errors can occur which can change the documentation meaning.    Final Clinical Impression(s) / ED Diagnoses Final diagnoses:  Acute left-sided thoracic back pain    Rx / DC Orders ED Discharge Orders         Ordered    cyclobenzaprine (FLEXERIL) 10 MG tablet  2 times daily PRN     12/17/19 0914           Virgina Norfolk, DO 12/17/19 [phone]

## 2019-12-17 NOTE — ED Triage Notes (Signed)
Left upper back pain x3 days.  Had some numbness in left arm 2 days ago.  No radiation of pain.  Denies chest pain. No known injury.  Sts she works from home.

## 2019-12-17 NOTE — Discharge Instructions (Signed)
Chest x-ray showed no fracture or infection.  EKG did not show any concern for cardiac process.  Suspect that you have a muscle strain.  Recommend 800 mg of Motrin every 8 hours for the next 5 days and then as needed.  Recommend 1000 mg of Tylenol, 4 times a day for the next 5 days and then as needed.  Take Flexeril as needed for spasms as well.

## 2019-12-17 NOTE — ED Notes (Signed)
Pt transported to XR.  

## 2020-02-01 ENCOUNTER — Encounter: Payer: Self-pay | Admitting: Family Medicine

## 2020-02-01 ENCOUNTER — Other Ambulatory Visit: Payer: Self-pay

## 2020-02-01 ENCOUNTER — Ambulatory Visit (INDEPENDENT_AMBULATORY_CARE_PROVIDER_SITE_OTHER): Payer: No Typology Code available for payment source | Admitting: Family Medicine

## 2020-02-01 VITALS — BP 128/84 | HR 72 | Ht 65.0 in | Wt 266.0 lb

## 2020-02-01 DIAGNOSIS — L659 Nonscarring hair loss, unspecified: Secondary | ICD-10-CM

## 2020-02-01 DIAGNOSIS — G43019 Migraine without aura, intractable, without status migrainosus: Secondary | ICD-10-CM | POA: Diagnosis not present

## 2020-02-01 DIAGNOSIS — Z9181 History of falling: Secondary | ICD-10-CM

## 2020-02-01 NOTE — Assessment & Plan Note (Signed)
Amitriptyline not effective.   She has also tried Topamax and gabapentin.  She has had a cervical spine block.  She didn't feel well with Treximet, Fioricet, tylenol, muscle relaxers, and Maxalt.

## 2020-02-01 NOTE — Assessment & Plan Note (Signed)
We also discussed referring her to a new dermatologist for consultation.

## 2020-02-01 NOTE — Progress Notes (Addendum)
Established Patient Office Visit  Subjective:  Patient ID: Meredith Barnes, female    DOB: 1976/03/06  Age: 44 y.o. MRN: [id number]  CC:  Chief Complaint  Patient presents with  . Establish Care  . Migraine    HPI Meredith Barnes presents to reestablish care.  She was my patient several years ago but lives in [facility] and so had sought out care more locally.  She is coming back to reestablish care today.  She does have a couple of concerns that she would like to discuss.  She needs some FMLA filled out for work for her migraine headaches.  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  She feels like that was a major trigger for when her headaches really becoming worse.  She says sometimes even just touching a certain area on the scalp can be really uncomfortable.  She has tried medications including amitriptyline, Topamax and gabapentin.  She is even had a nerve block at the cervical spine to help with her headaches.  She has not tried Botox or one of the newer medications such as Emgality.  She is not currently on any medications for headaches.  She misses work or has to leave early about 3 days/month.  Each episode usually last a day.  She is interested in a new neurology referral.   She also wanted let me know about an episode that she had in February 2020 where she developed some left-sided facial numbness.  She was evaluated for MS and this was ruled out.  More recently over the last year starting in October November she has been following.  She has had about 4 episodes where she felt like her right leg which is give out.  She denies any knee pain or hip pain or recent injury.  The last time it happened was about a week ago.  Again her right leg gave out and she fell.  She just feels like in general her balance is off and has not been quite  the same.  Past Medical History:  Diagnosis Date  . Alopecia   . Cyst, ovary, follicular   . Diverticulitis   . Ectopic pregnancy   . H. pylori infection   . Hx of hysterectomy partial for cervicl CA 2003  . Migraine headache   . Sigmoidoscopy exam partial sigmoid colectomy 07-23-08    Past Surgical History:  Procedure Laterality Date  . ABDOMINAL HYSTERECTOMY    . ECTOPIC PREGNANCY SURGERY    . FOOT SURGERY    . HERNIA REPAIR    . OOPHORECTOMY    . partial sigmoid colectomy 07-23-08  07-23-08  . TOTAL VAGINAL HYSTERECTOMY  2003   Cervical Cancer    History reviewed. No pertinent family history.  Social History   Socioeconomic History  . Marital status: Single    Spouse name: Brett Canales  . Number of children: 1  . Years of education: Not on file  . Highest education level: Not on file  Occupational History  . Not on file  Tobacco Use  . Smoking status: Never Smoker  . Smokeless tobacco: Never Used  Vaping Use  . Vaping Use: Never used  Substance and Sexual Activity  . Alcohol use: No  . Drug use: No  . Sexual activity: Not on file  Other Topics Concern  . Not on file  Social History Narrative   Daily caffeine   No regular exercise.    Social Determinants of Health   Financial Resource Strain:   . Difficulty of Paying Living Expenses:   Food Insecurity:   . Worried About Programme researcher, broadcasting/film/video in the Last Year:   . Barista in the Last Year:   Transportation Needs:   . Freight forwarder (Medical):   Marland Kitchen Lack of Transportation (Non-Medical):   Physical Activity:   . Days of Exercise per Week:   . Minutes of Exercise per Session:   Stress:   . Feeling of Stress :   Social Connections:   . Frequency of Communication with Friends and Family:   . Frequency of Social Gatherings with Friends and Family:   . Attends Religious Services:   . Active Member of Clubs or Organizations:   . Attends Banker Meetings:   Marland Kitchen Marital Status:   Intimate  Partner Violence:   . Fear of Current or Ex-Partner:   . Emotionally Abused:   Marland Kitchen Physically Abused:   . Sexually Abused:     Outpatient Medications Prior to Visit  Medication Sig Dispense Refill  . cyclobenzaprine (FLEXERIL) 10 MG tablet Take 1 tablet (10 mg total) by mouth 2 (two) times daily as needed for muscle spasms. 20 tablet 0  . pravastatin (PRAVACHOL) 10 MG tablet SMARTSIG:1 Tablet(s) By Mouth Every Evening    . Vitamin D, Ergocalciferol, (DRISDOL) 1.25 MG (50000 UNIT) CAPS capsule Take 50,000 Units by mouth once a week.     No facility-administered medications prior to visit.    Allergies  Allergen Reactions  . Iodinated Diagnostic Agents Nausea And Vomiting and Anaphylaxis  . Iodine Nausea And Vomiting    Pt states she is not allergic to iodine. Pt states she is not allergic to iodine.  . Tape     Paper tape-rips skin    ROS Review of Systems  Constitutional: Negative for diaphoresis, fever and unexpected weight change.  HENT: Negative for hearing loss, postnasal drip, sneezing and tinnitus.   Eyes: Negative for visual disturbance.  Respiratory: Negative for cough and wheezing.   Cardiovascular: Negative for chest pain and palpitations.  Genitourinary: Negative for vaginal bleeding and vaginal discharge.  Musculoskeletal: Negative for arthralgias.  Neurological: Positive for syncope. Negative for headaches.  Hematological: Negative for adenopathy. Does not bruise/bleed easily.      Objective:    Physical Exam Constitutional:      Appearance: She is well-developed.  HENT:     Head: Normocephalic and atraumatic.  Cardiovascular:     Rate and Rhythm: Normal rate and regular rhythm.     Heart sounds: Normal heart sounds.  Pulmonary:     Effort: Pulmonary effort is normal.     Breath sounds: Normal breath sounds.  Musculoskeletal:     Comments: Hips, knees, ankles with normal strength and symmetric.  Patellar reflexes 1+ bilaterally  Skin:    General:  Skin is warm and dry.     Comments: Head is shaved.   Neurological:     Mental Status: She is alert and oriented to person, place, and time.  Psychiatric:        Behavior: Behavior normal.     BP 128/84   Pulse 72   Ht 5\' 5"  (1.651 m)   Wt 266 lb (120.7 kg)   SpO2 100%   BMI 44.26 kg/m  Wt Readings from Last 3 Encounters:  02/01/20 266 lb (120.7  kg)  12/17/19 269 lb 9.6 oz (122.3 kg)  09/10/18 268 lb 8 oz (121.8 kg)     Health Maintenance Due  Topic Date Due  . Hepatitis C Screening  Never done  . COVID-19 Vaccine (1) Never done  . HIV Screening  Never done  . PAP SMEAR-Modifier  02/25/2010    There are no preventive care reminders to display for this patient.  Lab Results  Component Value Date   TSH 1.66 05/01/2016   Lab Results  Component Value Date   WBC 7.8 09/10/2018   HGB 12.3 09/10/2018   HCT 41.0 09/10/2018   MCV 80.2 09/10/2018   PLT 322 09/10/2018   Lab Results  Component Value Date   NA 141 09/10/2018   K 3.2 (L) 09/10/2018   CO2 28 09/10/2018   GLUCOSE 92 09/10/2018   BUN 12 09/10/2018   CREATININE 0.82 09/10/2018   BILITOT 0.3 08/26/2012   ALKPHOS 76 05/01/2016   AST 14 05/01/2016   ALT 19 05/01/2016   PROT 8.4 (H) 08/26/2012   ALBUMIN 4.2 08/26/2012   CALCIUM 9.4 09/10/2018   ANIONGAP 7 09/10/2018   Lab Results  Component Value Date   CHOL 185 05/01/2016   Lab Results  Component Value Date   HDL 45 05/01/2016   Lab Results  Component Value Date   LDLCALC 115 05/01/2016   Lab Results  Component Value Date   TRIG 124 05/01/2016   Lab Results  Component Value Date   CHOLHDL 4.3 Ratio 02/28/2007   Lab Results  Component Value Date   HGBA1C 5.9 05/01/2016      Assessment & Plan:   Problem List Items Addressed This Visit      Cardiovascular and Mediastinum   Migraine without aura - Primary    Amitriptyline not effective.   She has also tried Topamax and gabapentin.  She has had a cervical spine block.  She didn't  feel well with Treximet, Fioricet, tylenol, muscle relaxers, and Maxalt.       Relevant Orders   Ambulatory referral to Neurology     Musculoskeletal and Integument   Alopecia of scalp    We also discussed referring her to a new dermatologist for consultation.      Relevant Orders   Ambulatory referral to Dermatology    Other Visit Diagnoses    History of recent fall       Relevant Orders   Ambulatory referral to Neurology     Recent falls-unclear etiology.  Strength seems to be pretty normal in both lower extremities.  It is really unclear if it might just be that her knee has been giving out she says it seems like it is always that leg but really cannot pinpoint if it is related to the knee ankle or hip.  She is not having any joint pain so again it is a little unusual.  No orders of the defined types were placed in this encounter.   Follow-up: Return in about 6 months (around 08/03/2020).    Nani Gasser, MD

## 2020-02-02 ENCOUNTER — Encounter: Payer: Self-pay | Admitting: Neurology

## 2020-02-03 ENCOUNTER — Encounter: Payer: Self-pay | Admitting: Family Medicine

## 2020-02-12 ENCOUNTER — Telehealth: Payer: Self-pay | Admitting: Physician Assistant

## 2020-02-12 NOTE — Telephone Encounter (Signed)
NP, referred by [facility], appointment 07/28/20 @11 :00 01

## 2020-02-25 ENCOUNTER — Telehealth: Payer: Self-pay | Admitting: Family Medicine

## 2020-02-25 NOTE — Telephone Encounter (Addendum)
Error

## 2020-02-29 ENCOUNTER — Emergency Department (HOSPITAL_BASED_OUTPATIENT_CLINIC_OR_DEPARTMENT_OTHER): Payer: No Typology Code available for payment source

## 2020-02-29 ENCOUNTER — Emergency Department (HOSPITAL_BASED_OUTPATIENT_CLINIC_OR_DEPARTMENT_OTHER)
Admission: EM | Admit: 2020-02-29 | Discharge: 2020-02-29 | Disposition: A | Discharge status: Home / Self Care | Payer: No Typology Code available for payment source | Attending: Emergency Medicine | Admitting: Emergency Medicine

## 2020-02-29 ENCOUNTER — Encounter (HOSPITAL_BASED_OUTPATIENT_CLINIC_OR_DEPARTMENT_OTHER): Payer: Self-pay | Admitting: *Deleted

## 2020-02-29 ENCOUNTER — Other Ambulatory Visit: Payer: Self-pay

## 2020-02-29 DIAGNOSIS — R05 Cough: Secondary | ICD-10-CM | POA: Insufficient documentation

## 2020-02-29 DIAGNOSIS — Z20822 Contact with and (suspected) exposure to covid-19: Secondary | ICD-10-CM | POA: Insufficient documentation

## 2020-02-29 DIAGNOSIS — J209 Acute bronchitis, unspecified: Secondary | ICD-10-CM | POA: Insufficient documentation

## 2020-02-29 DIAGNOSIS — R0981 Nasal congestion: Secondary | ICD-10-CM | POA: Diagnosis not present

## 2020-02-29 DIAGNOSIS — R0982 Postnasal drip: Secondary | ICD-10-CM | POA: Diagnosis not present

## 2020-02-29 DIAGNOSIS — R0602 Shortness of breath: Secondary | ICD-10-CM | POA: Diagnosis present

## 2020-02-29 LAB — SARS CORONAVIRUS 2 BY RT PCR (HOSPITAL ORDER, PERFORMED IN ~~LOC~~ HOSPITAL LAB): SARS Coronavirus 2: NEGATIVE

## 2020-02-29 MED ORDER — HYDROCOD POLST-CPM POLST ER 10-8 MG/5ML PO SUER: 5.0000 mL | Freq: Two times a day (BID) | ORAL | 0 refills | Status: DC | PRN

## 2020-02-29 MED ORDER — AZITHROMYCIN 250 MG PO TABS: 250.0000 mg | ORAL_TABLET | Freq: Every day | ORAL | 0 refills | Status: DC

## 2020-02-29 NOTE — ED Triage Notes (Signed)
Pt states she was "wheezing" on Thursday. Was seen at urgent care and placed on prednisone and cough medication. Pt states covid test was negative. Pt states prod cough tonight. States she is unable to sleep at all due to cough. States she feels a little sob. sats 99% RA.

## 2020-02-29 NOTE — Discharge Instructions (Addendum)
You were seen in the emergency department for worsening cough and chest congestion.  Your chest x-ray did not show any obvious pneumonia.  Your Covid test was pending at time of discharge.  It should result in the next hour or 2.  We are prescribing antibiotics for possible bronchitis.  Also prescribing you some prescription cough medicine.  Please drink plenty of fluids.  Follow-up with your doctor or will return to the emergency department if any worsening symptoms.  If you are Covid positive you will need to isolate up to 14 days.

## 2020-02-29 NOTE — ED Provider Notes (Signed)
[facility] EMERGENCY DEPARTMENT Provider Note   CSN: [id number] Arrival date & time: 02/29/20  5631     History Chief Complaint  Patient presents with  . Shortness of Breath    Meredith Barnes is a 44 y.o. female.  She is complaining of a dry cough that started 5 days ago burning in the chest runny nose.  No fever.  Went to urgent care and was told she was wheezing and treated with prednisone and Tessalon Perles.  She continues to cough and it keeping her up at night.  Had her Covid vaccine.  No sick contacts.  The history is provided by the patient.  URI Presenting symptoms: congestion, cough and rhinorrhea   Presenting symptoms: no fever and no sore throat   Cough:    Cough characteristics:  Non-productive   Sputum characteristics:  Nondescript   Severity:  Moderate   Onset quality:  Gradual   Duration:  5 days   Timing:  Intermittent   Progression:  Unchanged   Chronicity:  New Relieved by:  Nothing Worsened by:  Nothing Ineffective treatments:  Prescription medications Associated symptoms: no arthralgias, no headaches and no neck pain   Risk factors: no diabetes mellitus and no immunosuppression        Past Medical History:  Diagnosis Date  . Alopecia   . Cyst, ovary, follicular   . Diverticulitis   . Ectopic pregnancy   . H. pylori infection   . Hx of hysterectomy partial for cervicl CA 2003  . Migraine headache   . Sigmoidoscopy exam partial sigmoid colectomy 07-23-08    Patient Active Problem List   Diagnosis Date Noted  . Alopecia of scalp 02/01/2020  . Tenosynovitis of foot and ankle 12/23/2013  . IFG (impaired fasting glucose) 05/27/2012  . OVARIAN CYST 02/07/2009  . Migraine without aura 05/13/2007  . HEMATURIA 09/23/2006  . ASTHMA 06/20/2006  . ECZEMA 06/20/2006  . ANEMIA, IRON DEFICIENCY, UNSPEC. 04/30/2006  . FATIGUE/MALAISE 04/30/2006    Past Surgical History:  Procedure Laterality Date  . ABDOMINAL HYSTERECTOMY    . ECTOPIC  PREGNANCY SURGERY    . FOOT SURGERY    . HERNIA REPAIR    . OOPHORECTOMY    . partial sigmoid colectomy 07-23-08  07-23-08  . TOTAL VAGINAL HYSTERECTOMY  2003   Cervical Cancer     OB History   No obstetric history on file.     No family history on file.  Social History   Tobacco Use  . Smoking status: Never Smoker  . Smokeless tobacco: Never Used  Vaping Use  . Vaping Use: Never used  Substance Use Topics  . Alcohol use: No  . Drug use: No    Home Medications Prior to Admission medications   Not on File    Allergies    Iodinated diagnostic agents, Iodine, and Tape  Review of Systems   Review of Systems  Constitutional: Negative for fever.  HENT: Positive for congestion and rhinorrhea. Negative for sore throat.   Eyes: Negative for visual disturbance.  Respiratory: Positive for cough. Negative for shortness of breath.   Cardiovascular: Negative for chest pain.  Gastrointestinal: Negative for abdominal pain.  Genitourinary: Negative for dysuria.  Musculoskeletal: Negative for arthralgias and neck pain.  Skin: Negative for rash.  Neurological: Negative for headaches.    Physical Exam Updated Vital Signs BP (!) 130/92 (BP Location: Right Arm)   Pulse 82   Temp 98.1 F (36.7 C) (Oral)   Resp  20   Ht 5\' 5"  (1.651 m)   Wt 117.9 kg   SpO2 100%   BMI 43.27 kg/m   Physical Exam Vitals and nursing note reviewed.  Constitutional:      General: She is not in acute distress.    Appearance: She is well-developed.  HENT:     Head: Normocephalic and atraumatic.     Right Ear: Tympanic membrane normal.     Left Ear: Tympanic membrane normal.     Nose: Congestion present.     Mouth/Throat:     Mouth: Mucous membranes are moist.     Pharynx: Oropharynx is clear.  Eyes:     Conjunctiva/sclera: Conjunctivae normal.  Cardiovascular:     Rate and Rhythm: Normal rate and regular rhythm.     Heart sounds: No murmur heard.   Pulmonary:     Effort: Pulmonary effort  is normal. No respiratory distress.     Breath sounds: Normal breath sounds.  Abdominal:     Palpations: Abdomen is soft.     Tenderness: There is no abdominal tenderness.  Musculoskeletal:        General: Normal range of motion.     Cervical back: Neck supple.     Right lower leg: No edema.     Left lower leg: No edema.  Lymphadenopathy:     Cervical: No cervical adenopathy.  Skin:    General: Skin is warm and dry.     Capillary Refill: Capillary refill takes less than 2 seconds.  Neurological:     General: No focal deficit present.     Mental Status: She is alert.     ED Results / Procedures / Treatments   Labs (all labs ordered are listed, but only abnormal results are displayed) Labs Reviewed  SARS CORONAVIRUS 2 BY RT PCR (HOSPITAL ORDER, PERFORMED IN [facility])    EKG None  Radiology DG Chest Port 1 View  Result Date: 02/29/2020 CLINICAL DATA:  Cough EXAM: PORTABLE CHEST 1 VIEW COMPARISON:  Dec 17, 2019 FINDINGS: The lungs are clear. Heart is borderline prominent with pulmonary vascularity normal. No adenopathy. No bone lesions. IMPRESSION: Heart borderline prominent.  Lungs clear. Electronically Signed   By: Dec 19, 2019 III M.D.   On: 02/29/2020 08:16    Procedures Procedures (including critical care time)  Medications Ordered in ED Medications - No data to display  ED Course  I have reviewed the triage vital signs and the nursing notes.  Pertinent labs & imaging results that were available during my care of the patient were reviewed by me and considered in my medical decision making (see chart for details).    MDM Rules/Calculators/A&P                         Meredith Barnes was evaluated in Emergency Department on 02/29/2020 for the symptoms described in the history of present illness. She was evaluated in the context of the global COVID-19 pandemic, which necessitated consideration that the patient might be at risk for infection with the  SARS-CoV-2 virus that causes COVID-19. Institutional protocols and algorithms that pertain to the evaluation of patients at risk for COVID-19 are in a state of rapid change based on information released by regulatory bodies including the CDC and federal and state organizations. These policies and algorithms were followed during the patient's care in the ED.   Patient is here with some shortness of breath cough runny nose.  Cough is keeping her up at night.  Differential includes bronchitis, pneumonia, Covid, allergies, asthma.  Chest x-ray interpreted by me as no acute infiltrates.  Her Covid testing is negative.  Will treat empirically for bronchitis with Zithromax.  She is already on steroids. Final Clinical Impression(s) / ED Diagnoses Final diagnoses:  Acute bronchitis, unspecified organism  Person under investigation for COVID-19    Rx / DC Orders ED Discharge Orders         Ordered    azithromycin (ZITHROMAX) 250 MG tablet  Daily     Discontinue  Reprint     02/29/20 0840    chlorpheniramine-HYDROcodone (TUSSIONEX PENNKINETIC ER) 10-8 MG/5ML SUER  Every 12 hours PRN     Discontinue  Reprint     02/29/20 0840           Terrilee Files, MD 02/29/20 2039

## 2020-03-10 ENCOUNTER — Encounter: Payer: Self-pay | Admitting: Family Medicine

## 2020-03-10 ENCOUNTER — Ambulatory Visit (INDEPENDENT_AMBULATORY_CARE_PROVIDER_SITE_OTHER): Payer: No Typology Code available for payment source | Admitting: Family Medicine

## 2020-03-10 ENCOUNTER — Other Ambulatory Visit: Payer: Self-pay

## 2020-03-10 VITALS — BP 134/86 | HR 66 | Ht 65.0 in | Wt 267.0 lb

## 2020-03-10 DIAGNOSIS — N760 Acute vaginitis: Secondary | ICD-10-CM

## 2020-03-10 DIAGNOSIS — Z113 Encounter for screening for infections with a predominantly sexual mode of transmission: Secondary | ICD-10-CM | POA: Diagnosis not present

## 2020-03-10 DIAGNOSIS — J209 Acute bronchitis, unspecified: Secondary | ICD-10-CM | POA: Diagnosis not present

## 2020-03-10 DIAGNOSIS — A5901 Trichomonal vulvovaginitis: Secondary | ICD-10-CM

## 2020-03-10 LAB — WET PREP FOR TRICH, YEAST, CLUE
MICRO NUMBER:: 10847231
Specimen Quality: ADEQUATE

## 2020-03-10 NOTE — Progress Notes (Signed)
Established Patient Office Visit  Subjective:  Patient ID: Meredith Barnes, female    DOB: 1975-11-30  Age: 44 y.o. MRN: [id number]  CC:  Chief Complaint  Patient presents with  . Follow-up    bronchitis    HPI Meredith Barnes presents for emergency department visit on August 9., Approximately 10 days ago. Symptoms that actually started 5 days prior. She did test negative for Covid at that time. Chest x-ray was normal.  Pt reports that she continues to have a productive cough at times. She has finished the antibiotics, and prednisone. She is still taking the tessalon and tussionex at night prn. Denies any f/s/c/n/v/d.  Was treated with azithromycin and Tussionex cough syrup.  She does feel like she is probably about 70% better.  Still low energy.  Also wanted discussed that she is noticed an increase in clear vaginal discharge since being on the antibiotics no itching.  But she is also noticed a bump which she says is gotten a little bit smaller but is just been sore and irritated she has been trying to do some bath soaks and using what sounds like possibly Hibiclens on the area and says it has gotten a little bit smaller.  She is also noticed odor to the vaginal discharge.  Past Medical History:  Diagnosis Date  . Alopecia   . Cyst, ovary, follicular   . Diverticulitis   . Ectopic pregnancy   . H. pylori infection   . Hx of hysterectomy partial for cervicl CA 2003  . Migraine headache   . Sigmoidoscopy exam partial sigmoid colectomy 07-23-08    Past Surgical History:  Procedure Laterality Date  . ABDOMINAL HYSTERECTOMY    . ECTOPIC PREGNANCY SURGERY    . FOOT SURGERY    . HERNIA REPAIR    . OOPHORECTOMY    . partial sigmoid colectomy 07-23-08  07-23-08  . TOTAL VAGINAL HYSTERECTOMY  2003   Cervical Cancer    No family history on file.  Social History   Socioeconomic History  . Marital status: Single    Spouse name: Brett Canales  . Number of children: 1  . Years of education: Not  on file  . Highest education level: Not on file  Occupational History  . Not on file  Tobacco Use  . Smoking status: Never Smoker  . Smokeless tobacco: Never Used  Vaping Use  . Vaping Use: Never used  Substance and Sexual Activity  . Alcohol use: No  . Drug use: No  . Sexual activity: Not on file  Other Topics Concern  . Not on file  Social History Narrative   Daily caffeine   No regular exercise.    Social Determinants of Health   Financial Resource Strain:   . Difficulty of Paying Living Expenses: Not on file  Food Insecurity:   . Worried About Programme researcher, broadcasting/film/video in the Last Year: Not on file  . Ran Out of Food in the Last Year: Not on file  Transportation Needs:   . Lack of Transportation (Medical): Not on file  . Lack of Transportation (Non-Medical): Not on file  Physical Activity:   . Days of Exercise per Week: Not on file  . Minutes of Exercise per Session: Not on file  Stress:   . Feeling of Stress : Not on file  Social Connections:   . Frequency of Communication with Friends and Family: Not on file  . Frequency of Social Gatherings with Friends and Family: Not  on file  . Attends Religious Services: Not on file  . Active Member of Clubs or Organizations: Not on file  . Attends Banker Meetings: Not on file  . Marital Status: Not on file  Intimate Partner Violence:   . Fear of Current or Ex-Partner: Not on file  . Emotionally Abused: Not on file  . Physically Abused: Not on file  . Sexually Abused: Not on file    Outpatient Medications Prior to Visit  Medication Sig Dispense Refill  . benzonatate (TESSALON) 100 MG capsule Take 100 mg by mouth 3 (three) times daily as needed.    . chlorpheniramine-HYDROcodone (TUSSIONEX PENNKINETIC ER) 10-8 MG/5ML SUER Take 5 mLs by mouth every 12 (twelve) hours as needed for cough. 70 mL 0  . azithromycin (ZITHROMAX) 250 MG tablet Take 1 tablet (250 mg total) by mouth daily. Take first 2 tablets together, then  1 every day until finished. 6 tablet 0   No facility-administered medications prior to visit.    Allergies  Allergen Reactions  . Iodinated Diagnostic Agents Nausea And Vomiting and Anaphylaxis  . Iodine Nausea And Vomiting    Pt states she is not allergic to iodine. Pt states she is not allergic to iodine.  . Tape     Paper tape-rips skin    ROS Review of Systems    Objective:    Physical Exam Constitutional:      Appearance: She is well-developed.  HENT:     Head: Normocephalic and atraumatic.     Right Ear: Tympanic membrane, ear canal and external ear normal.     Left Ear: Tympanic membrane, ear canal and external ear normal.     Nose: Nose normal.  Eyes:     Conjunctiva/sclera: Conjunctivae normal.     Pupils: Pupils are equal, round, and reactive to light.  Neck:     Thyroid: No thyromegaly.  Cardiovascular:     Rate and Rhythm: Normal rate and regular rhythm.     Heart sounds: Normal heart sounds.  Pulmonary:     Effort: Pulmonary effort is normal.     Breath sounds: Normal breath sounds. No wheezing.  Musculoskeletal:     Cervical back: Neck supple.  Lymphadenopathy:     Cervical: No cervical adenopathy.  Skin:    General: Skin is warm and dry.     Comments: On the left inner labia posteriorly there is a small approximately 1 cm lesion that looks consistent with a draining cyst.  It is actually fairly soft and no surrounding erythema.  Neurological:     Mental Status: She is alert and oriented to person, place, and time.     BP 134/86   Pulse 66   Ht 5\' 5"  (1.651 m)   Wt 267 lb (121.1 kg)   SpO2 100%   BMI 44.43 kg/m  Wt Readings from Last 3 Encounters:  03/10/20 267 lb (121.1 kg)  02/29/20 260 lb (117.9 kg)  02/01/20 266 lb (120.7 kg)     Health Maintenance Due  Topic Date Due  . PAP SMEAR-Modifier  02/25/2010    There are no preventive care reminders to display for this patient.  Lab Results  Component Value Date   TSH 1.66  05/01/2016   Lab Results  Component Value Date   WBC 7.8 09/10/2018   HGB 12.3 09/10/2018   HCT 41.0 09/10/2018   MCV 80.2 09/10/2018   PLT 322 09/10/2018   Lab Results  Component Value Date  NA 141 09/10/2018   K 3.2 (L) 09/10/2018   CO2 28 09/10/2018   GLUCOSE 92 09/10/2018   BUN 12 09/10/2018   CREATININE 0.82 09/10/2018   BILITOT 0.3 08/26/2012   ALKPHOS 76 05/01/2016   AST 14 05/01/2016   ALT 19 05/01/2016   PROT 8.4 (H) 08/26/2012   ALBUMIN 4.2 08/26/2012   CALCIUM 9.4 09/10/2018   ANIONGAP 7 09/10/2018   Lab Results  Component Value Date   CHOL 185 05/01/2016   Lab Results  Component Value Date   HDL 45 05/01/2016   Lab Results  Component Value Date   LDLCALC 115 05/01/2016   Lab Results  Component Value Date   TRIG 124 05/01/2016   Lab Results  Component Value Date   CHOLHDL 4.3 Ratio 02/28/2007   Lab Results  Component Value Date   HGBA1C 5.9 05/01/2016      Assessment & Plan:   Problem List Items Addressed This Visit    None    Visit Diagnoses    Acute bronchitis, unspecified organism    -  Primary   Acute vaginitis       Relevant Orders   RPR   HIV Antibody (routine testing w rflx) (Completed)   Hepatitis C Antibody   C. trachomatis/N. gonorrhoeae RNA (Completed)   WET PREP FOR TRICH, YEAST, CLUE (Completed)   Screen for STD (sexually transmitted disease)       Relevant Orders   RPR   HIV Antibody (routine testing w rflx) (Completed)   Hepatitis C Antibody   C. trachomatis/N. gonorrhoeae RNA (Completed)   Trichomoniasis of vagina       Relevant Medications   metroNIDAZOLE (FLAGYL) 500 MG tablet     Bronchitis-she is actually getting much better she is about 70% improved lungs sound absolutely clear on exam today which is very reassuring continue symptomatic care.  If she does need a refill on the hydrocodone cough syrup we can do that for her she will let us know.  Vaginitis-we will screen for bacteria, yeast and  STDs.  Small labial abscess-seems to be improving on its own with her current care so just continue current care and call if not improving completely or if new symptoms develop.  Testing came back positive for trichomoniasis-we will treat with 2000 mg x 1 dose.  Recommend repeat testing in 3 months.  Have partner tested and treated.  Meds ordered this encounter  Medications  . metroNIDAZOLE (FLAGYL) 500 MG tablet    Sig: Take 4 tablets (2,000 mg total) by mouth once for 1 dose.    Dispense:  4 tablet    Refill:  0    Follow-up: Return if symptoms worsen or fail to improve.   Time spent 30 minutes in encounter including reviewing notes from the emergency department.  Nani Gasser, MD

## 2020-03-10 NOTE — Progress Notes (Signed)
Pt reports that she continues to have a productive cough at times. She has finished the antibiotics, and prednisone. She is still taking the tessalon and tussionex at night prn. Denies any f/s/c/n/v/d.

## 2020-03-11 LAB — HIV ANTIBODY (ROUTINE TESTING W REFLEX): HIV 1&2 Ab, 4th Generation: NONREACTIVE

## 2020-03-11 LAB — FLUORESCENT TREPONEMAL AB(FTA)-IGG-BLD: Fluorescent Treponemal ABS: NONREACTIVE

## 2020-03-11 LAB — C. TRACHOMATIS/N. GONORRHOEAE RNA
C. trachomatis RNA, TMA: NOT DETECTED
N. gonorrhoeae RNA, TMA: NOT DETECTED

## 2020-03-11 LAB — RPR TITER: RPR Titer: 1:1 {titer} — ABNORMAL HIGH

## 2020-03-11 LAB — HEPATITIS C ANTIBODY
Hepatitis C Ab: NONREACTIVE
SIGNAL TO CUT-OFF: 0.01 (ref <1.00)

## 2020-03-11 LAB — RPR: RPR Ser Ql: REACTIVE — AB

## 2020-03-11 MED ORDER — METRONIDAZOLE 500 MG PO TABS: 2000.0000 mg | ORAL_TABLET | Freq: Once | ORAL | 0 refills | Status: AC

## 2020-03-15 ENCOUNTER — Other Ambulatory Visit: Payer: Self-pay | Admitting: *Deleted

## 2020-03-15 DIAGNOSIS — Z113 Encounter for screening for infections with a predominantly sexual mode of transmission: Secondary | ICD-10-CM

## 2020-04-05 ENCOUNTER — Telehealth: Payer: Self-pay | Admitting: Family Medicine

## 2020-04-05 NOTE — Telephone Encounter (Signed)
Pt called and left a Voicemail stating that her work is waiting on the FMLA updated paperwork with dates of 02/05/20-08/06/20. Patient is requesting to Please call her back at [phone] and update her with this. She states they were missing the page with the updated dates of 7/16-1/15/22

## 2020-04-06 NOTE — Telephone Encounter (Signed)
Pt spoke w/Meredith M. And informed her that she would drop these forms off to our office.

## 2020-04-06 NOTE — Telephone Encounter (Signed)
There's not a form that has been sent/completed for this specific time period that she is asking about. Last form was faxed back in July.

## 2020-04-07 ENCOUNTER — Telehealth: Payer: Self-pay | Admitting: *Deleted

## 2020-04-07 LAB — FLUORESCENT TREPONEMAL AB(FTA)-IGG-BLD: Fluorescent Treponemal ABS: NONREACTIVE

## 2020-04-07 NOTE — Telephone Encounter (Signed)
Updated FMLA form completed,faxed,confirmation received and scanned into patient's chart.

## 2020-04-21 ENCOUNTER — Encounter: Payer: No Typology Code available for payment source | Admitting: Family Medicine

## 2020-04-22 ENCOUNTER — Telehealth: Payer: Self-pay | Admitting: Family Medicine

## 2020-04-22 NOTE — Telephone Encounter (Signed)
Refaxed FMLA paperwork.

## 2020-04-27 NOTE — Progress Notes (Signed)
NEUROLOGY CONSULTATION NOTE  Tashawna C Barnes MRN: [id number] DOB: 01/18/1976  Referring provider: Nani Gasser, MD Primary care provider: Nani Gasser, MD  Reason for consult:  migraines  HISTORY OF PRESENT ILLNESS: Meredith Barnes is a 44 year old right-handed female who presents for migraines and falls.  History supplemented by prior neurologist's and referring provider's notes.  Migraines:  She has had migraines, which occur in variable locations and associated with photophobia and phonophobia but not nausea, vomiting or visual disturbance.  Workup included an MRI of brain without contrast on 11/16/2010, which was personally reviewed and demonstrated mild nonspecific punctate foci in the cerebral white matter which may be attributed to migraines.  They are currently well-controlled, occurring every 2 to 3 months.   Since early 2020, she reports episodes of falls in which her left leg just gives out.  No associated vertigo, lightheadedness, syncope, headache, radicular pain down the leg, left leg numbness or paralysis.  It lasts just a second.  She is able to get right back up.  Also, in February of 2020, she had episode of left sided facial and arm paresthesias lasting one to two hours.  No associated headache.  She was worked up by neurology at [facility].  MRI of brain with and without contrast from 09/10/2018, personally reviewed, again demonstrated mild nonspecific punctate foci in the cerebral white matter but overall unremarkable and stable compared to prior MRI in 2012, with no abnormalities to suggest MS.  Complicated migraine was suspected.  She has known OSA but does not use a CPAP.  She denies excessive drowsiness where she easily falls asleep and denies sleep paralysis.    Since 2019, she has had alopecia on her scalp that causes pain and tenderness to the touch.  No numbness, tingling or burning.  It may be so severe that she cannot wear headphones at work.  She has  tried gabapentin, which was ineffective.   Current medications:  None  Past NSAIDS:  naproxen Past analgesics:  Tylenol, Excedrin, tramadol Past abortive triptans:  Maxalt Past abortive ergotamine:  none Past muscle relaxants:  none Past anti-emetic:  Reglan, Zofran Past antihypertensive medications:  none Past antidepressant medications:  amitriptyline Past anticonvulsant medications:  Topiramate, gabapentin Past anti-CGRP:  none    PAST MEDICAL HISTORY: Past Medical History:  Diagnosis Date  . Alopecia   . Cyst, ovary, follicular   . Diverticulitis   . Ectopic pregnancy   . H. pylori infection   . Hx of hysterectomy partial for cervicl CA 2003  . Migraine headache   . Sigmoidoscopy exam partial sigmoid colectomy 07-23-08    PAST SURGICAL HISTORY: Past Surgical History:  Procedure Laterality Date  . ABDOMINAL HYSTERECTOMY    . ECTOPIC PREGNANCY SURGERY    . FOOT SURGERY    . HERNIA REPAIR    . OOPHORECTOMY    . partial sigmoid colectomy 07-23-08  07-23-08  . TOTAL VAGINAL HYSTERECTOMY  2003   Cervical Cancer    MEDICATIONS: Current Outpatient Medications on File Prior to Visit  Medication Sig Dispense Refill  . benzonatate (TESSALON) 100 MG capsule Take 100 mg by mouth 3 (three) times daily as needed.    . chlorpheniramine-HYDROcodone (TUSSIONEX PENNKINETIC ER) 10-8 MG/5ML SUER Take 5 mLs by mouth every 12 (twelve) hours as needed for cough. 70 mL 0   No current facility-administered medications on file prior to visit.    ALLERGIES: Allergies  Allergen Reactions  . Iodinated Diagnostic Agents Nausea And Vomiting  and Anaphylaxis  . Iodine Nausea And Vomiting    Pt states she is not allergic to iodine. Pt states she is not allergic to iodine.  . Tape     Paper tape-rips skin    FAMILY HISTORY: Family History  Problem Relation Age of Onset  . Stroke Father     SOCIAL HISTORY: Social History   Socioeconomic History  . Marital status: Single    Spouse  name: Brett Canales  . Number of children: 1  . Years of education: Not on file  . Highest education level: Not on file  Occupational History  . Not on file  Tobacco Use  . Smoking status: Never Smoker  . Smokeless tobacco: Never Used  Vaping Use  . Vaping Use: Never used  Substance and Sexual Activity  . Alcohol use: No  . Drug use: No  . Sexual activity: Not on file  Other Topics Concern  . Not on file  Social History Narrative   Daily caffeine   No regular exercise.    Social Determinants of Health   Financial Resource Strain:   . Difficulty of Paying Living Expenses: Not on file  Food Insecurity:   . Worried About Programme researcher, broadcasting/film/video in the Last Year: Not on file  . Ran Out of Food in the Last Year: Not on file  Transportation Needs:   . Lack of Transportation (Medical): Not on file  . Lack of Transportation (Non-Medical): Not on file  Physical Activity:   . Days of Exercise per Week: Not on file  . Minutes of Exercise per Session: Not on file  Stress:   . Feeling of Stress : Not on file  Social Connections:   . Frequency of Communication with Friends and Family: Not on file  . Frequency of Social Gatherings with Friends and Family: Not on file  . Attends Religious Services: Not on file  . Active Member of Clubs or Organizations: Not on file  . Attends Banker Meetings: Not on file  . Marital Status: Not on file  Intimate Partner Violence:   . Fear of Current or Ex-Partner: Not on file  . Emotionally Abused: Not on file  . Physically Abused: Not on file  . Sexually Abused: Not on file   PHYSICAL EXAM: Blood pressure (!) 144/93, pulse 84, height 5\' 5"  (1.651 m), weight 266 lb 3.2 oz (120.7 kg), SpO2 99 %. General: No acute distress.  Patient appears well-groomed.   Head:  Normocephalic/atraumatic Eyes:  fundi examined but not visualized Neck: supple, no paraspinal tenderness, full range of motion Back: No paraspinal tenderness Heart: regular rate and  rhythm Lungs: Clear to auscultation bilaterally. Vascular: No carotid bruits. Neurological Exam: Mental status: alert and oriented to person, place, and time, recent and remote memory intact, fund of knowledge intact, attention and concentration intact, speech fluent and not dysarthric, language intact. Cranial nerves: CN I: not tested CN II: pupils equal, round and reactive to light, visual fields intact CN III, IV, VI:  full range of motion, no nystagmus, no ptosis CN V: facial sensation intact CN VII: upper and lower face symmetric CN VIII: hearing intact CN IX, X: gag intact, uvula midline CN XI: sternocleidomastoid and trapezius muscles intact CN XII: tongue midline Bulk & Tone: normal, no fasciculations. Motor:  5/5 throughout  Sensation:  Pinprick and vibration sensation intact.  Deep Tendon Reflexes:  2+ throughout, toes downgoing.  Finger to nose testing:  Without dysmetria.  Heel to shin:  Without dysmetria.  Gait:  Normal station and stride.  Able to turn and tandem walk. Romberg negative.  IMPRESSION: 1.  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 2.  Drop attacks.  No associated pain or numbness to suggest radiculopathy.  Consider cataplexy but she does not exhibit any other symptoms of narcolepsy.  I would like to assess for possible seizure.  PLAN: 1.  Routine EEG to further assess drop attacks. 2.  For scalp discomfort, start duloxetine 30mg  daily for one month, then 60mg  daily.   3.  Follow up after testing.  Thank you for allowing me to take part in the care of this patient.  , DO  CC: , MD

## 2020-04-29 ENCOUNTER — Ambulatory Visit (INDEPENDENT_AMBULATORY_CARE_PROVIDER_SITE_OTHER): Payer: No Typology Code available for payment source | Admitting: Neurology

## 2020-04-29 ENCOUNTER — Other Ambulatory Visit: Payer: Self-pay

## 2020-04-29 ENCOUNTER — Encounter: Payer: Self-pay | Admitting: Neurology

## 2020-04-29 VITALS — BP 144/93 | HR 84 | Ht 65.0 in | Wt 266.2 lb

## 2020-04-29 DIAGNOSIS — M792 Neuralgia and neuritis, unspecified: Secondary | ICD-10-CM

## 2020-04-29 DIAGNOSIS — R55 Syncope and collapse: Secondary | ICD-10-CM

## 2020-04-29 MED ORDER — DULOXETINE HCL 30 MG PO CPEP: ORAL_CAPSULE | ORAL | 0 refills | Status: DC

## 2020-04-29 NOTE — Patient Instructions (Signed)
1.  For scalp discomfort, we will start duloxetine 30mg  daily for one week, then increase to 60mg  daily.  Contact me in 8 weeks with update and we can make changes if needed. 2.  To evaluate the falls, we will check an EEG.  Further recommendations pending results. 3.  Follow up after testing.

## 2020-05-09 ENCOUNTER — Other Ambulatory Visit: Payer: Self-pay

## 2020-05-09 ENCOUNTER — Ambulatory Visit (INDEPENDENT_AMBULATORY_CARE_PROVIDER_SITE_OTHER): Payer: No Typology Code available for payment source | Admitting: Neurology

## 2020-05-09 DIAGNOSIS — R55 Syncope and collapse: Secondary | ICD-10-CM | POA: Diagnosis not present

## 2020-05-09 DIAGNOSIS — M792 Neuralgia and neuritis, unspecified: Secondary | ICD-10-CM

## 2020-05-10 ENCOUNTER — Encounter: Payer: Self-pay | Admitting: Family Medicine

## 2020-05-10 ENCOUNTER — Ambulatory Visit (INDEPENDENT_AMBULATORY_CARE_PROVIDER_SITE_OTHER): Payer: No Typology Code available for payment source | Admitting: Family Medicine

## 2020-05-10 VITALS — BP 125/84 | HR 88 | Ht 65.0 in | Wt 266.0 lb

## 2020-05-10 DIAGNOSIS — Z0000 Encounter for general adult medical examination without abnormal findings: Secondary | ICD-10-CM

## 2020-05-10 NOTE — Progress Notes (Signed)
Subjective:     Meredith Barnes is a 44 y.o. female and is here for a comprehensive physical exam. The patient reports no problems.  She is doing well overall.  Some exercise but not regularly.  She is planning on getting her mammogram next month.  She is had a complete and total hysterectomy.   Social History   Socioeconomic History  . Marital status: Single    Spouse name: Brett Canales  . Number of children: 1  . Years of education: Not on file  . Highest education level: Not on file  Occupational History  . Not on file  Tobacco Use  . Smoking status: Never Smoker  . Smokeless tobacco: Never Used  Vaping Use  . Vaping Use: Never used  Substance and Sexual Activity  . Alcohol use: No  . Drug use: No  . Sexual activity: Not on file  Other Topics Concern  . Not on file  Social History Narrative   Daily caffeine   No regular exercise.       Right haned   Lives with daughter one story home   Social Determinants of Health   Financial Resource Strain:   . Difficulty of Paying Living Expenses: Not on file  Food Insecurity:   . Worried About Programme researcher, broadcasting/film/video in the Last Year: Not on file  . Ran Out of Food in the Last Year: Not on file  Transportation Needs:   . Lack of Transportation (Medical): Not on file  . Lack of Transportation (Non-Medical): Not on file  Physical Activity:   . Days of Exercise per Week: Not on file  . Minutes of Exercise per Session: Not on file  Stress:   . Feeling of Stress : Not on file  Social Connections:   . Frequency of Communication with Friends and Family: Not on file  . Frequency of Social Gatherings with Friends and Family: Not on file  . Attends Religious Services: Not on file  . Active Member of Clubs or Organizations: Not on file  . Attends Banker Meetings: Not on file  . Marital Status: Not on file  Intimate Partner Violence:   . Fear of Current or Ex-Partner: Not on file  . Emotionally Abused: Not on file  .  Physically Abused: Not on file  . Sexually Abused: Not on file   Health Maintenance  Topic Date Due  . INFLUENZA VACCINE  10/20/2020 (Originally 02/21/2020)  . COVID-19 Vaccine (1) 03/10/2021 (Originally 03/26/1989)  . TETANUS/TDAP  07/02/2029  . Hepatitis C Screening  Completed  . HIV Screening  Completed    The following portions of the patient's history were reviewed and updated as appropriate: allergies, current medications, past family history, past medical history, past social history, past surgical history and problem list.  Review of Systems A comprehensive review of systems was negative.   Objective:    BP 125/84   Pulse 88   Ht 5\' 5"  (1.651 m)   Wt 266 lb (120.7 kg)   SpO2 100%   BMI 44.26 kg/m  General appearance: alert, cooperative and appears stated age Head: Normocephalic, without obvious abnormality, atraumatic Eyes: conj clear, EOMI, PEERLA Ears: normal TM's and external ear canals both ears Nose: Nares normal. Septum midline. Mucosa normal. No drainage or sinus tenderness. Throat: lips, mucosa, and tongue normal; teeth and gums normal Neck: no adenopathy, no carotid bruit, no JVD, supple, symmetrical, trachea midline and thyroid not enlarged, symmetric, no tenderness/mass/nodules Back: symmetric, no curvature.  ROM normal. No CVA tenderness. Lungs: clear to auscultation bilaterally Breasts: normal appearance, no masses or tenderness Heart: regular rate and rhythm, S1, S2 normal, no murmur, click, rub or gallop Abdomen: soft, non-tender; bowel sounds normal; no masses,  no organomegaly Extremities: extremities normal, atraumatic, no cyanosis or edema Pulses: 2+ and symmetric Skin: Skin color, texture, turgor normal. No rashes or lesions Lymph nodes: Cervical adenopathy: nl, Axillary adenopathy: nl and Supraclavicular adenopathy: nl Neurologic: Alert and oriented X 3, normal strength and tone. Normal symmetric reflexes. Normal coordination and gait    Assessment:     Healthy female exam.      Plan:     See After Visit Summary for Counseling Recommendations   Keep up a regular exercise program and make sure you are eating a healthy diet Try to eat 4 servings of dairy a day, or if you are lactose intolerant take a calcium with vitamin D daily.  Your vaccines are up to date.

## 2020-05-10 NOTE — Procedures (Signed)
ELECTROENCEPHALOGRAM REPORT  Date of Study: 05/09/2020  Patient's Name: Meredith Barnes MRN: [id number] Date of Birth: 06-14-1976  Referring Provider:  Nani Gasser, MD  Clinical History: 44 year old female presents for possible drop attacks.  Medications: TESSALON 100 MG capsule TUSSIONEX PENNKINETIC ER 10-8 MG/5ML SUER  Technical Summary: A multichannel digital EEG recording measured by the international 10-20 system with electrodes applied with paste and impedances below 5000 ohms performed in our laboratory with EKG monitoring in an awake and asleep patient.  Hyperventilation not performed as patient wearing mask due to Covid-19 pandemic.  Photic stimulation was performed.  The digital EEG was referentially recorded, reformatted, and digitally filtered in a variety of bipolar and referential montages for optimal display.    Description: The patient is awake and asleep during the recording.  During maximal wakefulness, there is a symmetric, medium voltage 11 Hz posterior dominant rhythm that attenuates with eye opening.  The record is symmetric.  During drowsiness and sleep, there is an increase in theta slowing of the background.  Vertex waves and symmetric sleep spindles were seen.  Photic stimulation did not elicit any abnormalities.  There were no epileptiform discharges or electrographic seizures seen.    EKG lead was unremarkable.  Impression: This awake and asleep EEG is normal.    Clinical Correlation: A normal EEG does not exclude a clinical diagnosis of epilepsy.  If further clinical questions remain, prolonged EEG may be helpful.  Clinical correlation is advised.   Shon Millet, DO

## 2020-05-10 NOTE — Patient Instructions (Signed)
 Preventive Care 21-43 Years Old, Female Preventive care refers to visits with your health care provider and lifestyle choices that can promote health and wellness. This includes:  A yearly physical exam. This may also be called an annual well check.  Regular dental visits and eye exams.  Immunizations.  Screening for certain conditions.  Healthy lifestyle choices, such as eating a healthy diet, getting regular exercise, not using drugs or products that contain nicotine and tobacco, and limiting alcohol use. What can I expect for my preventive care visit? Physical exam Your health care provider will check your:  Height and weight. This may be used to calculate body mass index (BMI), which tells if you are at a healthy weight.  Heart rate and blood pressure.  Skin for abnormal spots. Counseling Your health care provider may ask you questions about your:  Alcohol, tobacco, and drug use.  Emotional well-being.  Home and relationship well-being.  Sexual activity.  Eating habits.  Work and work environment.  Method of birth control.  Menstrual cycle.  Pregnancy history. What immunizations do I need?  Influenza (flu) vaccine  This is recommended every year. Tetanus, diphtheria, and pertussis (Tdap) vaccine  You may need a Td booster every 10 years. Varicella (chickenpox) vaccine  You may need this if you have not been vaccinated. Human papillomavirus (HPV) vaccine  If recommended by your health care provider, you may need three doses over 6 months. Measles, mumps, and rubella (MMR) vaccine  You may need at least one dose of MMR. You may also need a second dose. Meningococcal conjugate (MenACWY) vaccine  One dose is recommended if you are age 19-21 years and a first-year college student living in a residence hall, or if you have one of several medical conditions. You may also need additional booster doses. Pneumococcal conjugate (PCV13) vaccine  You may need  this if you have certain conditions and were not previously vaccinated. Pneumococcal polysaccharide (PPSV23) vaccine  You may need one or two doses if you smoke cigarettes or if you have certain conditions. Hepatitis A vaccine  You may need this if you have certain conditions or if you travel or work in places where you may be exposed to hepatitis A. Hepatitis B vaccine  You may need this if you have certain conditions or if you travel or work in places where you may be exposed to hepatitis B. Haemophilus influenzae type b (Hib) vaccine  You may need this if you have certain conditions. You may receive vaccines as individual doses or as more than one vaccine together in one shot (combination vaccines). Talk with your health care provider about the risks and benefits of combination vaccines. What tests do I need?  Blood tests  Lipid and cholesterol levels. These may be checked every 5 years starting at age 20.  Hepatitis C test.  Hepatitis B test. Screening  Diabetes screening. This is done by checking your blood sugar (glucose) after you have not eaten for a while (fasting).  Sexually transmitted disease (STD) testing.  BRCA-related cancer screening. This may be done if you have a family history of breast, ovarian, tubal, or peritoneal cancers.  Pelvic exam and Pap test. This may be done every 3 years starting at age 21. Starting at age 30, this may be done every 5 years if you have a Pap test in combination with an HPV test. Talk with your health care provider about your test results, treatment options, and if necessary, the need for more   tests. Follow these instructions at home: Eating and drinking   Eat a diet that includes fresh fruits and vegetables, whole grains, lean protein, and low-fat dairy.  Take vitamin and mineral supplements as recommended by your health care provider.  Do not drink alcohol if: ? Your health care provider tells you not to drink. ? You are  pregnant, may be pregnant, or are planning to become pregnant.  If you drink alcohol: ? Limit how much you have to 0-1 drink a day. ? Be aware of how much alcohol is in your drink. In the [place] one drink equals one 12 oz bottle of beer (355 mL), one 5 oz glass of wine (148 mL), or one 1 oz glass of hard liquor (44 mL). Lifestyle  Take daily care of your teeth and gums.  Stay active. Exercise for at least 30 minutes on 5 or more days each week.  Do not use any products that contain nicotine or tobacco, such as cigarettes, e-cigarettes, and chewing tobacco. If you need help quitting, ask your health care provider.  If you are sexually active, practice safe sex. Use a condom or other form of birth control (contraception) in order to prevent pregnancy and STIs (sexually transmitted infections). If you plan to become pregnant, see your health care provider for a preconception visit. What's next?  Visit your health care provider once a year for a well check visit.  Ask your health care provider how often you should have your eyes and teeth checked.  Stay up to date on all vaccines. This information is not intended to replace advice given to you by your health care provider. Make sure you discuss any questions you have with your health care provider. Document Revised: 03/20/2018 Document Reviewed: 03/20/2018 Elsevier Patient Education  2020 Elsevier Inc.  

## 2020-05-26 ENCOUNTER — Other Ambulatory Visit: Payer: Self-pay | Admitting: Neurology

## 2020-06-19 ENCOUNTER — Encounter: Payer: Self-pay | Admitting: Family Medicine

## 2020-06-20 ENCOUNTER — Ambulatory Visit (INDEPENDENT_AMBULATORY_CARE_PROVIDER_SITE_OTHER): Payer: No Typology Code available for payment source | Admitting: Nurse Practitioner

## 2020-06-20 ENCOUNTER — Encounter: Payer: Self-pay | Admitting: Nurse Practitioner

## 2020-06-20 VITALS — BP 133/84 | HR 85 | Temp 98.5°F | Ht 65.0 in | Wt 269.0 lb

## 2020-06-20 DIAGNOSIS — N898 Other specified noninflammatory disorders of vagina: Secondary | ICD-10-CM

## 2020-06-20 DIAGNOSIS — A6004 Herpesviral vulvovaginitis: Secondary | ICD-10-CM | POA: Diagnosis not present

## 2020-06-20 DIAGNOSIS — R682 Dry mouth, unspecified: Secondary | ICD-10-CM | POA: Diagnosis not present

## 2020-06-20 LAB — WET PREP FOR TRICH, YEAST, CLUE
MICRO NUMBER:: 11251849
Specimen Quality: ADEQUATE

## 2020-06-20 MED ORDER — VALACYCLOVIR HCL 500 MG PO TABS: 500.0000 mg | ORAL_TABLET | Freq: Two times a day (BID) | ORAL | 3 refills | Status: DC | PRN

## 2020-06-20 NOTE — Progress Notes (Signed)
Acute Office Visit  Subjective:    Patient ID: Meredith Barnes, female    DOB: 04/12/1976, 44 y.o.   MRN: [id number]  Chief Complaint  Patient presents with  . Vaginal Discharge    HPI Patient is in today for watery vaginal discharge that has been present for the last several days. She reports that she did have vaginal intercourse last week and over the weekend she noticed clear, milky vaginal discharge. She reports that the discharge feels like it is "running out of the vagina" and is causing irritation to the perineal area.   She denies any odor, abdominal pain, pelvic pain, vaginal bleeding, fevers, chills, nausea, vomiting, or diarrhea.   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 She states that she was prescribed to take the medication daily, but as opted to take only when she feels an outbreak occurring. She would like a refill on this today to have for future outbreaks. She denies any current symptoms or lesions present.   She also reports symptoms of dry mouth recently. She tells me that she know that she does not drink enough water. She denies dry eyes or symptoms of dry mucous membranes. She tells me that she does not feel dehydrated and wants to know what could be done to help with the dry mouth symptoms.  Past Medical History:  Diagnosis Date  . Alopecia   . Cyst, ovary, follicular   . Diverticulitis   . Ectopic pregnancy   . H. pylori infection   . Hx of hysterectomy partial for cervicl CA 2003  . Migraine headache   . Sigmoidoscopy exam partial sigmoid colectomy 07-23-08    Past Surgical History:  Procedure Laterality Date  . ABDOMINAL HYSTERECTOMY    . ECTOPIC PREGNANCY SURGERY    . FOOT SURGERY    . HERNIA REPAIR    . OOPHORECTOMY    . partial sigmoid colectomy 07-23-08  07-23-08  . TOTAL VAGINAL HYSTERECTOMY  2003   Cervical Cancer    Family History  Problem Relation Age of Onset  . Stroke Father      Social History   Socioeconomic History  . Marital status: Single    Spouse name: Brett CanalesSteve  . Number of children: 1  . Years of education: Not on file  . Highest education level: Not on file  Occupational History  . Not on file  Tobacco Use  . Smoking status: Never Smoker  . Smokeless tobacco: Never Used  Vaping Use  . Vaping Use: Never used  Substance and Sexual Activity  . Alcohol use: No  . Drug use: No  . Sexual activity: Not on file  Other Topics Concern  . Not on file  Social History Narrative   Daily caffeine   No regular exercise.       Right haned   Lives with daughter one story home   Social Determinants of Health   Financial Resource Strain:   . Difficulty of Paying Living Expenses: Not on file  Food Insecurity:   . Worried About Programme researcher, broadcasting/film/videounning Out of Food in the Last Year: Not on file  . Ran Out of Food in the Last Year: Not on file  Transportation Needs:   . Lack of Transportation (Medical): Not on file  . Lack of Transportation (Non-Medical): Not on file  Physical Activity:   . Days of Exercise per Week: Not on file  . Minutes of Exercise per  Session: Not on file  Stress:   . Feeling of Stress : Not on file  Social Connections:   . Frequency of Communication with Friends and Family: Not on file  . Frequency of Social Gatherings with Friends and Family: Not on file  . Attends Religious Services: Not on file  . Active Member of Clubs or Organizations: Not on file  . Attends Banker Meetings: Not on file  . Marital Status: Not on file  Intimate Partner Violence:   . Fear of Current or Ex-Partner: Not on file  . Emotionally Abused: Not on file  . Physically Abused: Not on file  . Sexually Abused: Not on file    Outpatient Medications Prior to Visit  Medication Sig Dispense Refill  . valACYclovir (VALTREX) 500 MG tablet Take 500 mg by mouth daily as needed.    . DULoxetine (CYMBALTA) 30 MG capsule TAKE 1 CAPSULE DAILY FOR ONE WEEK, THEN  INCREASE TO 2 CAPSULES DAILY. (Patient not taking: Reported on 06/20/2020) 180 capsule 1   No facility-administered medications prior to visit.    Allergies  Allergen Reactions  . Iodinated Diagnostic Agents Nausea And Vomiting and Anaphylaxis  . Iodine Nausea And Vomiting    Pt states she is not allergic to iodine. Pt states she is not allergic to iodine.  . Tape     Paper tape-rips skin    Review of Systems All review of systems negative except what is listed in the HPI     Objective:    Physical Exam Vitals and nursing note reviewed.  Constitutional:      Appearance: Normal appearance.  HENT:     Head: Normocephalic.     Mouth/Throat:     Mouth: Mucous membranes are dry.     Pharynx: Oropharynx is clear. No oropharyngeal exudate or posterior oropharyngeal erythema.  Eyes:     Extraocular Movements: Extraocular movements intact.     Conjunctiva/sclera: Conjunctivae normal.     Pupils: Pupils are equal, round, and reactive to light.  Cardiovascular:     Rate and Rhythm: Normal rate and regular rhythm.     Pulses: Normal pulses.     Heart sounds: Normal heart sounds.  Pulmonary:     Effort: Pulmonary effort is normal.     Breath sounds: Normal breath sounds.  Abdominal:     General: Abdomen is flat. Bowel sounds are normal. There is no distension.     Palpations: Abdomen is soft.     Tenderness: There is no abdominal tenderness. There is no right CVA tenderness, left CVA tenderness or guarding.  Musculoskeletal:        General: Normal range of motion.     Cervical back: Normal range of motion.     Right lower leg: No edema.     Left lower leg: No edema.  Skin:    General: Skin is warm and dry.     Capillary Refill: Capillary refill takes less than 2 seconds.  Neurological:     General: No focal deficit present.     Mental Status: She is alert and oriented to person, place, and time.  Psychiatric:        Mood and Affect: Mood normal.        Behavior: Behavior  normal.        Thought Content: Thought content normal.        Judgment: Judgment normal.     BP 133/84   Pulse 85   Temp 98.5 F (  36.9 C) (Oral)   Ht 5\' 5"  (1.651 m)   Wt 269 lb (122 kg)   SpO2 97%   BMI 44.76 kg/m  Wt Readings from Last 3 Encounters:  06/20/20 269 lb (122 kg)  05/10/20 266 lb (120.7 kg)  04/29/20 266 lb 3.2 oz (120.7 kg)    There are no preventive care reminders to display for this patient.  There are no preventive care reminders to display for this patient.   Lab Results  Component Value Date   TSH 1.66 05/01/2016   Lab Results  Component Value Date   WBC 7.8 09/10/2018   HGB 12.3 09/10/2018   HCT 41.0 09/10/2018   MCV 80.2 09/10/2018   PLT 322 09/10/2018   Lab Results  Component Value Date   NA 141 09/10/2018   K 3.2 (L) 09/10/2018   CO2 28 09/10/2018   GLUCOSE 92 09/10/2018   BUN 12 09/10/2018   CREATININE 0.82 09/10/2018   BILITOT 0.3 08/26/2012   ALKPHOS 76 05/01/2016   AST 14 05/01/2016   ALT 19 05/01/2016   PROT 8.4 (H) 08/26/2012   ALBUMIN 4.2 08/26/2012   CALCIUM 9.4 09/10/2018   ANIONGAP 7 09/10/2018   Lab Results  Component Value Date   CHOL 185 05/01/2016   Lab Results  Component Value Date   HDL 45 05/01/2016   Lab Results  Component Value Date   LDLCALC 115 05/01/2016   Lab Results  Component Value Date   TRIG 124 05/01/2016   Lab Results  Component Value Date   CHOLHDL 4.3 Ratio 02/28/2007   Lab Results  Component Value Date   HGBA1C 5.9 05/01/2016       Assessment & Plan:   1. Vaginal discharge Symptoms consistent with vaginal infection, but unclear etiology at this time. Will test for gonorrhea and chlamydia today as well as BV, yeast, and trichomoniasis.  No urinary symptoms at this time, therefore UA was deferred today. Speculum examination deferred today- sample received from self swab.   Will notify patient of results and send appropriate treatment once received.  Discussed plan with  patient and she is agreeable to wait for treatment selection until test results reviewed.  Will send diflucan with treatment for yeast that often accompanies antibiotic use, if required.  - WET PREP FOR TRICH, YEAST, CLUE - C. trachomatis/N. gonorrhoeae RNA  2. Herpes simplex vulvovaginitis Refills provided today with instructions on proper use for outbreaks.  Discussed with patient if she has more than 4 outbreaks per year, suppressive treatment will be recommended. She is agreeable to this plan. - valACYclovir (VALTREX) 500 MG tablet; Take 1 tablet (500 mg total) by mouth 2 (two) times daily as needed. For 3 to 5 days at the first sign of outbreak  Dispense: 30 tablet; Refill: 3  3. Dry mouth, unspecified Suspect dry mouth symptoms are due to dehydration. She does not have any symptoms that would indicate the presence of underlying condition, such as Sjogren, today or any medication use that would cause increased dry mouth.  Recommend increase water intake to a minimum of 64 ounces per day. Also recommend frequent oral care and use of biotin or alternative mouth moisturizer. Discussed that sugar free gum or candy can be helpful to increase salivary production and decrease symptoms of dry mouth.  Recommend follow-up for further evaluation if symptoms persist despite the above treatments.    Meds ordered this encounter  Medications  . valACYclovir (VALTREX) 500 MG tablet    Sig:  Take 1 tablet (500 mg total) by mouth 2 (two) times daily as needed. For 3 to 5 days at the first sign of outbreak    Dispense:  30 tablet    Refill:  3     Tollie Eth, NP

## 2020-06-20 NOTE — Patient Instructions (Signed)
Bacterial Vaginosis  Bacterial vaginosis is an infection of the vagina. It happens when too many normal germs (healthy bacteria) grow in the vagina. This infection puts you at risk for infections from sex (STIs). Treating this infection can lower your risk for some STIs. You should also treat this if you are pregnant. It can cause your baby to be born Meredith Barnes. Follow these instructions at home: Medicines  Take over-the-counter and prescription medicines only as told by your doctor.  Take or use your antibiotic medicine as told by your doctor. Do not stop taking or using it even if you start to feel better. General instructions  If you your sexual partner is a woman, tell her that you have this infection. She needs to get treatment if she has symptoms. If you have a female partner, he does not need to be treated.  During treatment: ? Avoid sex. ? Do not douche. ? Avoid alcohol as told. ? Avoid breastfeeding as told.  Drink enough fluid to keep your pee (urine) clear or pale yellow.  Keep your vagina and butt (rectum) clean. ? Wash the area with warm water every day. ? Wipe from front to back after you use the toilet.  Keep all follow-up visits as told by your doctor. This is important. Preventing this condition  Do not douche.  Use only warm water to wash around your vagina.  Use protection when you have sex. This includes: ? Latex condoms. ? Dental dams.  Limit how many people you have sex with. It is best to only have sex with the same person (be monogamous).  Get tested for STIs. Have your partner get tested.  Wear underwear that is cotton or lined with cotton.  Avoid tight pants and pantyhose. This is most important in summer.  Do not use any products that have nicotine or tobacco in them. These include cigarettes and e-cigarettes. If you need help quitting, ask your doctor.  Do not use illegal drugs.  Limit how much alcohol you drink. Contact a doctor if:  Your  symptoms do not get better, even after you are treated.  You have more discharge or pain when you pee (urinate).  You have a fever.  You have pain in your belly (abdomen).  You have pain with sex.  Your bleed from your vagina between periods. Summary  This infection happens when too many germs (bacteria) grow in the vagina.  Treating this condition can lower your risk for some infections from sex (STIs).  You should also treat this if you are pregnant. It can cause Chris Cripps (premature) birth.  Do not stop taking or using your antibiotic medicine even if you start to feel better. This information is not intended to replace advice given to you by your health care provider. Make sure you discuss any questions you have with your health care provider. Document Revised: 06/21/2017 Document Reviewed: 03/24/2016 Elsevier Patient Education  2020 Elsevier Inc.    Vaginal Yeast Infection, Adult  Vaginal yeast infection is a condition that causes vaginal discharge as well as soreness, swelling, and redness (inflammation) of the vagina. This is a common condition. Some women get this infection frequently. What are the causes? This condition is caused by a change in the normal balance of the yeast (candida) and bacteria that live in the vagina. This change causes an overgrowth of yeast, which causes the inflammation. What increases the risk? The condition is more likely to develop in women who:  Take antibiotic medicines.    Have diabetes.  Take birth control pills.  Are pregnant.  Douche often.  Have a weak body defense system (immune system).  Have been taking steroid medicines for a long time.  Frequently wear tight clothing. What are the signs or symptoms? Symptoms of this condition include:  White, thick, creamy vaginal discharge.  Swelling, itching, redness, and irritation of the vagina. The lips of the vagina (vulva) may be affected as well.  Pain or a burning feeling  while urinating.  Pain during sex. How is this diagnosed? This condition is diagnosed based on:  Your medical history.  A physical exam.  A pelvic exam. Your health care provider will examine a sample of your vaginal discharge under a microscope. Your health care provider may send this sample for testing to confirm the diagnosis. How is this treated? This condition is treated with medicine. Medicines may be over-the-counter or prescription. You may be told to use one or more of the following:  Medicine that is taken by mouth (orally).  Medicine that is applied as a cream (topically).  Medicine that is inserted directly into the vagina (suppository). Follow these instructions at home:  Lifestyle  Do not have sex until your health care provider approves. Tell your sex partner that you have a yeast infection. That person should go to his or her health care provider and ask if they should also be treated.  Do not wear tight clothes, such as pantyhose or tight pants.  Wear breathable cotton underwear. General instructions  Take or apply over-the-counter and prescription medicines only as told by your health care provider.  Eat more yogurt. This may help to keep your yeast infection from returning.  Do not use tampons until your health care provider approves.  Try taking a sitz bath to help with discomfort. This is a warm water bath that is taken while you are sitting down. The water should only come up to your hips and should cover your buttocks. Do this 3-4 times per day or as told by your health care provider.  Do not douche.  If you have diabetes, keep your blood sugar levels under control.  Keep all follow-up visits as told by your health care provider. This is important. Contact a health care provider if:  You have a fever.  Your symptoms go away and then return.  Your symptoms do not get better with treatment.  Your symptoms get worse.  You have new  symptoms.  You develop blisters in or around your vagina.  You have blood coming from your vagina and it is not your menstrual period.  You develop pain in your abdomen. Summary  Vaginal yeast infection is a condition that causes discharge as well as soreness, swelling, and redness (inflammation) of the vagina.  This condition is treated with medicine. Medicines may be over-the-counter or prescription.  Take or apply over-the-counter and prescription medicines only as told by your health care provider.  Do not douche. Do not have sex or use tampons until your health care provider approves.  Contact a health care provider if your symptoms do not get better with treatment or your symptoms go away and then return. This information is not intended to replace advice given to you by your health care provider. Make sure you discuss any questions you have with your health care provider. Document Revised: 02/06/2019 Document Reviewed: 11/25/2017 Elsevier Patient Education  2020 Elsevier Inc.  

## 2020-06-21 ENCOUNTER — Encounter: Payer: Self-pay | Admitting: Nurse Practitioner

## 2020-06-21 ENCOUNTER — Other Ambulatory Visit: Payer: Self-pay | Admitting: Nurse Practitioner

## 2020-06-21 DIAGNOSIS — N760 Acute vaginitis: Secondary | ICD-10-CM

## 2020-06-21 MED ORDER — CLOTRIMAZOLE-BETAMETHASONE 1-0.05 % EX CREA: 1.0000 "application " | TOPICAL_CREAM | Freq: Two times a day (BID) | CUTANEOUS | 0 refills | Status: DC | PRN

## 2020-06-21 NOTE — Progress Notes (Signed)
Aleeha,   Your vaginal swab doesn't show any evidence of bacterial vaginosis, trichomonas, or yeast. We are still waiting on the gonorrhea and chlamydia results to come back. I will let you know as soon as these come in and we will make a plan based on these results.  SaraBeth

## 2020-06-22 ENCOUNTER — Other Ambulatory Visit: Payer: Self-pay | Admitting: Nurse Practitioner

## 2020-06-22 DIAGNOSIS — N898 Other specified noninflammatory disorders of vagina: Secondary | ICD-10-CM

## 2020-06-22 LAB — C. TRACHOMATIS/N. GONORRHOEAE RNA
C. trachomatis RNA, TMA: NOT DETECTED
N. gonorrhoeae RNA, TMA: NOT DETECTED

## 2020-06-22 MED ORDER — METRONIDAZOLE 500 MG PO TABS: 500.0000 mg | ORAL_TABLET | Freq: Two times a day (BID) | ORAL | 0 refills | Status: DC

## 2020-07-02 ENCOUNTER — Other Ambulatory Visit: Payer: Self-pay | Admitting: Nurse Practitioner

## 2020-07-02 DIAGNOSIS — A6004 Herpesviral vulvovaginitis: Secondary | ICD-10-CM

## 2020-07-11 ENCOUNTER — Other Ambulatory Visit: Payer: Self-pay | Admitting: Nurse Practitioner

## 2020-07-11 DIAGNOSIS — K117 Disturbances of salivary secretion: Secondary | ICD-10-CM

## 2020-07-11 DIAGNOSIS — K1379 Other lesions of oral mucosa: Secondary | ICD-10-CM

## 2020-07-28 ENCOUNTER — Ambulatory Visit (INDEPENDENT_AMBULATORY_CARE_PROVIDER_SITE_OTHER): Payer: No Typology Code available for payment source | Admitting: Physician Assistant

## 2020-07-28 ENCOUNTER — Encounter: Payer: Self-pay | Admitting: Physician Assistant

## 2020-07-28 ENCOUNTER — Other Ambulatory Visit: Payer: Self-pay

## 2020-07-28 DIAGNOSIS — L659 Nonscarring hair loss, unspecified: Secondary | ICD-10-CM

## 2020-07-28 MED ORDER — CLOBETASOL PROPIONATE 0.05 % EX SOLN
1.0000 | Freq: Two times a day (BID) | CUTANEOUS | 8 refills | Status: DC
Start: 2020-07-28 — End: 2020-10-12

## 2020-07-28 MED ORDER — TRIAMCINOLONE ACETONIDE 10 MG/ML IJ SUSP
5.0000 mg | Freq: Once | INTRAMUSCULAR | Status: AC
  Administered 2020-07-28: 5 mg

## 2020-07-28 NOTE — Progress Notes (Signed)
   New Patient   Subjective  Meredith Barnes is a 45 y.o. female who presents for the following: Alopecia (Dx back in 2019- bx done- scaring alopecia- Dr. Rocky Link [facility]) - several months- scalp has become tender and very sore to touch tx- oral med( but can't remember name)).   The following portions of the chart were reviewed this encounter and updated as appropriate:      Objective  Well appearing patient in no apparent distress; mood and affect are within normal limits.  A focused examination was performed including scalp. Relevant physical exam findings are noted in the Assessment and Plan.  Objective  Mid Frontal Scalp: Frontal scalp hair loss, with loss of follicle,scalp very tender- and boggy. eyebrows and eyelashes intact    Assessment & Plan  Alopecia Mid Frontal Scalp  triamcinolone acetonide (KENALOG) 10 MG/ML injection 5 mg - Mid Frontal Scalp  clobetasol (TEMOVATE) 0.05 % external solution - Mid Frontal Scalp    I, Cassy Sprowl, PA-C, have reviewed all documentation for this visit. The documentation on 07/28/20 for the exam, diagnosis, procedures, and orders are all accurate and complete.

## 2020-08-10 ENCOUNTER — Emergency Department (HOSPITAL_BASED_OUTPATIENT_CLINIC_OR_DEPARTMENT_OTHER)
Admission: EM | Admit: 2020-08-10 | Discharge: 2020-08-11 | Disposition: A | Discharge status: Home / Self Care | Payer: No Typology Code available for payment source | Attending: Emergency Medicine | Admitting: Emergency Medicine

## 2020-08-10 ENCOUNTER — Emergency Department (HOSPITAL_BASED_OUTPATIENT_CLINIC_OR_DEPARTMENT_OTHER): Payer: No Typology Code available for payment source

## 2020-08-10 ENCOUNTER — Encounter (HOSPITAL_BASED_OUTPATIENT_CLINIC_OR_DEPARTMENT_OTHER): Payer: Self-pay

## 2020-08-10 ENCOUNTER — Other Ambulatory Visit: Payer: Self-pay

## 2020-08-10 DIAGNOSIS — J1282 Pneumonia due to coronavirus disease 2019: Secondary | ICD-10-CM | POA: Diagnosis not present

## 2020-08-10 DIAGNOSIS — R059 Cough, unspecified: Secondary | ICD-10-CM | POA: Diagnosis present

## 2020-08-10 DIAGNOSIS — Z20822 Contact with and (suspected) exposure to covid-19: Secondary | ICD-10-CM

## 2020-08-10 DIAGNOSIS — J45909 Unspecified asthma, uncomplicated: Secondary | ICD-10-CM | POA: Diagnosis not present

## 2020-08-10 DIAGNOSIS — U071 COVID-19: Secondary | ICD-10-CM | POA: Diagnosis not present

## 2020-08-10 DIAGNOSIS — J189 Pneumonia, unspecified organism: Secondary | ICD-10-CM

## 2020-08-10 DIAGNOSIS — Z8541 Personal history of malignant neoplasm of cervix uteri: Secondary | ICD-10-CM | POA: Diagnosis not present

## 2020-08-10 MED ORDER — DOXYCYCLINE HYCLATE 100 MG PO CAPS: 100.0000 mg | ORAL_CAPSULE | Freq: Two times a day (BID) | ORAL | 0 refills | Status: AC

## 2020-08-10 MED ORDER — DOXYCYCLINE HYCLATE 100 MG PO TABS
100.0000 mg | ORAL_TABLET | Freq: Once | ORAL | Status: AC
  Administered 2020-08-10: 100 mg via ORAL
  Filled 2020-08-10: qty 1

## 2020-08-10 MED ORDER — ONDANSETRON 4 MG PO TBDP: 4.0000 mg | ORAL_TABLET | Freq: Three times a day (TID) | ORAL | 0 refills | Status: DC | PRN

## 2020-08-10 MED ORDER — ACETAMINOPHEN 500 MG PO TABS
1000.0000 mg | ORAL_TABLET | Freq: Once | ORAL | Status: AC
  Administered 2020-08-10: 1000 mg via ORAL
  Filled 2020-08-10: qty 2

## 2020-08-10 MED ORDER — IBUPROFEN 800 MG PO TABS
800.0000 mg | ORAL_TABLET | Freq: Once | ORAL | Status: AC
  Administered 2020-08-10: 800 mg via ORAL
  Filled 2020-08-10: qty 1

## 2020-08-10 MED ORDER — FLUTICASONE PROPIONATE 50 MCG/ACT NA SUSP: 2.0000 | Freq: Every day | NASAL | 0 refills | Status: DC

## 2020-08-10 MED ORDER — ALBUTEROL SULFATE HFA 108 (90 BASE) MCG/ACT IN AERS
2.0000 | INHALATION_SPRAY | Freq: Once | RESPIRATORY_TRACT | Status: AC
  Administered 2020-08-11: 2 via RESPIRATORY_TRACT
  Filled 2020-08-10: qty 6.7

## 2020-08-10 NOTE — ED Notes (Signed)
ED Provider at bedside. 

## 2020-08-10 NOTE — Discharge Instructions (Addendum)
You were seen in the ED today with COVID-like symptoms. Please remain isolated for the next 5 days at least per CDC guidelines. I am starting you on antibiotics. If your COVID test comes back positive you can stop the antibiotics. Follow the test results in the MyChart app.   Return to the ED with any new or worsening symptoms such as trouble breathing, chest pain, or passing out.

## 2020-08-10 NOTE — ED Triage Notes (Signed)
Pt c/o flu like sx x 1 week-DOE noted-covid booster 1/10

## 2020-08-10 NOTE — ED Provider Notes (Signed)
Emergency Department Provider Note   I have reviewed the triage vital signs and the nursing notes.   HISTORY  Chief Complaint Cough   HPI Meredith Barnes is a 45 y.o. female presents to the emergency department for evaluation of COVID-19 symptoms over the past week.  She has had fever with cough, body aches, diarrhea.  She denies any chest pain.  She is feeling some mild dyspnea especially with exertion.  She has been vaccinated and received her booster 9 days ago.  She denies any pleuritic pain.  No blacking out or vomiting.  She still has some soreness in her arm from her vaccination.  No radiation of symptoms or other modifying factors.   Past Medical History:  Diagnosis Date  . Alopecia   . Cyst, ovary, follicular   . Diverticulitis   . Ectopic pregnancy   . H. pylori infection   . Hx of hysterectomy partial for cervicl CA 2003  . Migraine headache   . Sigmoidoscopy exam partial sigmoid colectomy 07-23-08    Patient Active Problem List   Diagnosis Date Noted  . Alopecia of scalp 02/01/2020  . Tenosynovitis of foot and ankle 12/23/2013  . IFG (impaired fasting glucose) 05/27/2012  . OVARIAN CYST 02/07/2009  . Migraine without aura 05/13/2007  . HEMATURIA 09/23/2006  . ASTHMA 06/20/2006  . ECZEMA 06/20/2006  . ANEMIA, IRON DEFICIENCY, UNSPEC. 04/30/2006  . FATIGUE/MALAISE 04/30/2006    Past Surgical History:  Procedure Laterality Date  . ABDOMINAL HYSTERECTOMY    . ECTOPIC PREGNANCY SURGERY    . FOOT SURGERY    . HERNIA REPAIR    . OOPHORECTOMY    . partial sigmoid colectomy 07-23-08  07-23-08  . TOTAL VAGINAL HYSTERECTOMY  2003   Cervical Cancer    Allergies Iodinated diagnostic agents, Iodine, and Tape  Family History  Problem Relation Age of Onset  . Stroke Father     Social History Social History   Tobacco Use  . Smoking status: Never Smoker  . Smokeless tobacco: Never Used  Vaping Use  . Vaping Use: Never used  Substance Use Topics  .  Alcohol use: No  . Drug use: No    Review of Systems  Constitutional: Positive fever/chills and body aches.  Eyes: No visual changes. ENT: No sore throat. Cardiovascular: Denies chest pain. Respiratory: Denies shortness of breath w/ exertion and cough.  Gastrointestinal: No abdominal pain.  No nausea, no vomiting. Mild diarrhea.  No constipation. Genitourinary: Negative for dysuria. Musculoskeletal: Positive diffuse body aches.  Skin: Negative for rash. Neurological: Negative for focal weakness or numbness. Positive HA.   10-point ROS otherwise negative.  ____________________________________________   PHYSICAL EXAM:  VITAL SIGNS: ED Triage Vitals  Enc Vitals Group     BP 08/10/20 2245 (!) 150/83     Pulse Rate 08/10/20 2245 (!) 110     Resp 08/10/20 2245 (!) 24     Temp 08/10/20 2245 (!) 100.5 F (38.1 C)     Temp Source 08/10/20 2245 Oral     SpO2 08/10/20 2245 94 %     Weight 08/10/20 2244 268 lb (121.6 kg)     Height 08/10/20 2244 5\' 5"  (1.651 m)   Constitutional: Alert and oriented. Well appearing and in no acute distress. Eyes: Conjunctivae are normal.  Head: Atraumatic. Nose: No congestion/rhinnorhea. Mouth/Throat: Mucous membranes are moist. Neck: No stridor. Cardiovascular: Tachycardia. Good peripheral circulation. Grossly normal heart sounds.   Respiratory: Normal respiratory effort.  No retractions. Lungs CTAB.  Gastrointestinal: Soft and nontender. No distention.  Musculoskeletal: No gross deformities of extremities. Neurologic:  Normal speech and language.  Skin:  Skin is warm, dry and intact. No rash noted.   ____________________________________________   LABS (all labs ordered are listed, but only abnormal results are displayed)  Labs Reviewed  SARS CORONAVIRUS 2 (TAT 6-24 HRS)  ________________________________________  RADIOLOGY  DG Chest Portable 1 View  Result Date: 08/10/2020 CLINICAL DATA:  Cough and fever, flu like symptoms for 1 week  EXAM: PORTABLE CHEST 1 VIEW COMPARISON:  Radiograph 02/29/2020 FINDINGS: Some diffusely increased attenuation and diminished resolution is secondary to patient body habitus. There are patchy areas of opacity present throughout both lungs most coalescent in the periphery of the left lung base which is new from prior concerning for an acute infectious or inflammatory process. No pneumothorax or visible effusion. The cardiomediastinal contours are unremarkable. No acute osseous or soft tissue abnormality. IMPRESSION: Patchy areas of opacity throughout both lungs most coalescent in the periphery of the left lung base. Concerning for multifocal pneumonia in the setting of fever. Electronically Signed   By: Kreg Shropshire M.D.   On: 08/10/2020 23:30    ____________________________________________   PROCEDURES  Procedure(s) performed:   Procedures  None  ____________________________________________   INITIAL IMPRESSION / ASSESSMENT AND PLAN / ED COURSE  Pertinent labs & imaging results that were available during my care of the patient were reviewed by me and considered in my medical decision making (see chart for details).   Patient presents to the emergency department with COVID-19-like symptoms over the past week.  She is not experiencing chest pain.  She has some shortness of breath only with exertion.  Oxygen saturation here is normal and her work of breathing is comfortable appearing.  She is speaking in full sentences.  She is not having significant GI symptoms.  She is febrile on arrival but overall well-appearing.  Chest x-ray shows a multifocal pneumonia likely related to COVID-19.  I am sending PCR testing and patient has the MyChart app on her phone and will follow the test results in the next 6 to 24 hours.  Discussed quarantine and provided a work note although patient does work from home.  I discussed with her that while her symptoms now are mild to moderate she could develop worsening  symptoms and specifically discussed ED return precautions.  She is vaccinated.  I do not appreciate wheezing on exam to suspect asthma exacerbation.  Provide medication for supportive care along with prescription for doxycycline.  I discussed that if her COVID test comes back positive she can stop taking the doxycycline.   Meredith Barnes was evaluated in Emergency Department on 08/11/2020 for the symptoms described in the history of present illness. She was evaluated in the context of the global COVID-19 pandemic, which necessitated consideration that the patient might be at risk for infection with the SARS-CoV-2 virus that causes COVID-19. Institutional protocols and algorithms that pertain to the evaluation of patients at risk for COVID-19 are in a state of rapid change based on information released by regulatory bodies including the CDC and federal and state organizations. These policies and algorithms were followed during the patient's care in the ED.  ____________________________________________  FINAL CLINICAL IMPRESSION(S) / ED DIAGNOSES  Final diagnoses:  Suspected COVID-19 virus infection  Multifocal pneumonia     MEDICATIONS GIVEN DURING THIS VISIT:  Medications  acetaminophen (TYLENOL) tablet 1,000 mg (1,000 mg Oral Given 08/10/20 2319)  doxycycline (VIBRA-TABS) tablet 100  mg (100 mg Oral Given 08/10/20 2358)  albuterol (VENTOLIN HFA) 108 (90 Base) MCG/ACT inhaler 2 puff (2 puffs Inhalation Given 08/11/20 0001)  ibuprofen (ADVIL) tablet 800 mg (800 mg Oral Given 08/10/20 2358)     NEW OUTPATIENT MEDICATIONS STARTED DURING THIS VISIT:  Discharge Medication List as of 08/10/2020 11:50 PM    START taking these medications   Details  doxycycline (VIBRAMYCIN) 100 MG capsule Take 1 capsule (100 mg total) by mouth 2 (two) times daily for 7 days., Starting Wed 08/10/2020, Until Wed 08/17/2020, Normal    fluticasone (FLONASE) 50 MCG/ACT nasal spray Place 2 sprays into both nostrils daily  for 7 days., Starting Wed 08/10/2020, Until Wed 08/17/2020, Normal    ondansetron (ZOFRAN ODT) 4 MG disintegrating tablet Take 1 tablet (4 mg total) by mouth every 8 (eight) hours as needed., Starting Wed 08/10/2020, Normal        Note:  This document was prepared using Dragon voice recognition software and may include unintentional dictation errors.  Alona Bene, MD, [facility] Emergency Medicine    Delray Reza, Arlyss Repress, MD 08/11/20 [place]

## 2020-08-11 LAB — SARS CORONAVIRUS 2 (TAT 6-24 HRS): SARS Coronavirus 2: POSITIVE — AB

## 2020-09-04 ENCOUNTER — Encounter: Payer: Self-pay | Admitting: Family Medicine

## 2020-09-05 ENCOUNTER — Telehealth (INDEPENDENT_AMBULATORY_CARE_PROVIDER_SITE_OTHER): Payer: No Typology Code available for payment source | Admitting: Family Medicine

## 2020-09-05 ENCOUNTER — Encounter: Payer: Self-pay | Admitting: Family Medicine

## 2020-09-05 DIAGNOSIS — R059 Cough, unspecified: Secondary | ICD-10-CM

## 2020-09-05 DIAGNOSIS — R0602 Shortness of breath: Secondary | ICD-10-CM

## 2020-09-05 DIAGNOSIS — U099 Post covid-19 condition, unspecified: Secondary | ICD-10-CM

## 2020-09-05 MED ORDER — HYDROCODONE-HOMATROPINE 5-1.5 MG/5ML PO SYRP: 5.0000 mL | ORAL_SOLUTION | Freq: Three times a day (TID) | ORAL | 0 refills | Status: DC | PRN

## 2020-09-05 MED ORDER — FLOVENT HFA 44 MCG/ACT IN AERO: 2.0000 | INHALATION_SPRAY | Freq: Two times a day (BID) | RESPIRATORY_TRACT | 3 refills | Status: DC

## 2020-09-05 NOTE — Progress Notes (Signed)
Virtual Video Visit via MyChart Note  I connected with  Meredith Barnes on 09/05/20 at  2:00 PM EST by the video enabled telemedicine application for , MyChart, and verified that I am speaking with the correct person using two identifiers.   I introduced myself as a Publishing rights manager with the practice. We discussed the limitations of evaluation and management by telemedicine and the availability of in person appointments. The patient expressed understanding and agreed to proceed.  Participating parties in this visit include: The patient and the nurse practitioner listed.  The patient is: At home I am: In the office - Primary Care Meredith Barnes   Subjective:    CC:  Chief Complaint  Patient presents with  . Cough  . Shortness of Breath    HPI: Meredith Barnes is a 45 y.o. year old female presenting today via MyChart today for shortness of breath and cough post-COVID.  Patient tested positive for COVID on 08/10/20 when she was evaluated in the ED and has a CXR concerning for multifocal pneumonia. She was given doxycycline, but told not to complete it if her test came back positive - she did not take any doses.  Patient reporting she is still feeling pretty bad after testing positive for Covid.  She reports her 2 major ongoing concerns are shortness of breath and cough.  The cough she describes as unbearable, frequently throughout the day but worse at night, mildly productive, and interferes with her sleep.  She reports a few days ago she was coughing so bad she ended up throwing up.  She has been trying Alka-Seltzer cold plus at night just to get a little bit of rest.  She did try some Tessalon Perles which she had leftover from a previous prescription but they did not help at all.  She did take Mucinex early in the course but has not taken any recently.  Regarding shortness of breath, she reports she can only walk around the house for a few minutes before having to stop to catch her breath.  She  has been using her albuterol inhaler at least 3 times a day for the past several weeks.  She denies fevers, chest pain, and pain with deep breaths.  She reports her symptoms are not worsening, they are just not improving at the rate she would hope for.   Past medical history, Surgical history, Family history not pertinant except as noted below, Social history, Allergies, and medications have been entered into the medical record, reviewed, and corrections made.   Review of Systems:  All review of systems negative except what is listed in the HPI   Objective:    General:  Speaking clearly in complete sentences. Absent shortness of breath noted.   Alert and oriented x3.   Normal judgment.  Absent acute distress.   Impression and Recommendations:   1. Cough - fluticasone (FLOVENT HFA) 44 MCG/ACT inhaler; Inhale 2 puffs into the lungs 2 (two) times daily.  Dispense: 1 each; Refill: 3 - HYDROcodone-homatropine (HYCODAN) 5-1.5 MG/5ML syrup; Take 5 mLs by mouth every 8 (eight) hours as needed for cough.  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  Dispense: 1 each; Refill: 3  3. Shortness of breath - fluticasone (FLOVENT HFA) 44 MCG/ACT inhaler; Inhale 2 puffs into the lungs 2 (two) times daily.  Dispense: 1 each; Refill: 3   Patient with ongoing  Covid symptoms. We discussed that many of the symptoms may last quite a while.  However given the ongoing cough that is preventing sleep, I will send in a short round of cough syrup. PDMP reviewed. Reminded her that many pharmacies are in short supply so if she has trouble getting it she can send [place] a message and we can try changing to something else.  Recommend taking only at bedtime to help with sleep, reminded her that we want her to cough some during the day to keep her lungs clear.  Encouraged her to start back the Mucinex and Flonase  twice daily, humidifier use, hydration, and rest.  I will also send in a Flovent inhaler to see if we can get her shortness of breath better controlled. Educated her on signs and symptoms that would require urgent evaluation.  Follow-up if symptoms worsen, fail to improve, or new symptoms develop including fever, chest pain, etc.    I discussed the assessment and treatment plan with the patient. The patient was provided an opportunity to ask questions and all were answered. The patient agreed with the plan and demonstrated an understanding of the instructions.   The patient was advised to call back or seek an in-person evaluation if the symptoms worsen or if the condition fails to improve as anticipated.  I provided 20 minutes of non-face-to-face interaction with this MYCHART visit including intake, same-day documentation, and chart review.   Clayborne Dana, NP

## 2020-09-05 NOTE — Patient Instructions (Addendum)
   From UpToDate  Type, proportion, and duration of persistent COVID-19 symptoms* Persistent symptom Proportion of patients affected by symptom Approximate time to symptom resolution?  Common physical symptoms  Fatigue 15 to 87%[1,2,6,9,14] 3 months or longer  Dyspnea 10 to 71%[1,2,6-9,14] 2 to 3 months or longer  Chest discomfort 12 to 44%[1,2] 2 to 3 months  Cough 17 to 34%[1,2,9,12] 2 to 3 months or longer  Anosmia 10 to 13%[1,3-5,9,11] 1 month, rarely longer  Less common physical symptoms  Joint pain, headache, sicca syndrome, rhinitis, dysgeusia, poor appetite, dizziness, vertigo, myalgias, insomnia, alopecia, sweating, and diarrhea <10%[1,2,8,9,11] Unknown (likely weeks to months)  Psychologic and neurocognitive  Post-traumatic stress disorder  7 to 24%[6,10, 14] 6 weeks to 3 months or longer  Impaired memory 18 to 21%[6,15]  weeks to months  Poor concentration 16%[6] Weeks to months  Anxiety/depression 22 to 23%[2,7,8,10, 12,13, 14] Weeks to months  Reduction in quality of life >50%[8] Unknown (likely weeks to months)  COVID-19: coronavirus disease 2019. * These data are derived from an earlier period in the pandemic; information on patient recovery and persistent symptoms is evolving, and these figures may change as longer-term data emerge.  More than a third of patients with COVID-19 experience more than one persistent symptom. ? Time course for recovery varies depending on premorbid risk factors and illness severity and may be shorter or longer than that listed. Hospitalized patients, and in particular critically ill patients, are more likely to have a more protracted course than those with mild disease.

## 2020-09-12 LAB — HM MAMMOGRAPHY

## 2020-10-12 ENCOUNTER — Ambulatory Visit (INDEPENDENT_AMBULATORY_CARE_PROVIDER_SITE_OTHER): Payer: No Typology Code available for payment source | Admitting: Physician Assistant

## 2020-10-12 ENCOUNTER — Other Ambulatory Visit: Payer: Self-pay

## 2020-10-12 ENCOUNTER — Encounter: Payer: Self-pay | Admitting: Physician Assistant

## 2020-10-12 DIAGNOSIS — L659 Nonscarring hair loss, unspecified: Secondary | ICD-10-CM

## 2020-10-12 MED ORDER — CLOBETASOL PROPIONATE 0.05 % EX SOLN: 1.0000 "application " | Freq: Two times a day (BID) | CUTANEOUS | 6 refills | Status: DC

## 2020-10-12 MED ORDER — TRIAMCINOLONE ACETONIDE 10 MG/ML IJ SUSP
10.0000 mg | Freq: Once | INTRAMUSCULAR | Status: AC
  Administered 2020-10-12: 10 mg

## 2020-10-13 NOTE — Progress Notes (Signed)
   Follow-Up Visit   Subjective  Meredith Barnes is a 45 y.o. female who presents for the following: Follow-up (Alopecia- did IL TAC injection last office visit- cant really tell if it helped or not ).   The following portions of the chart were reviewed this encounter and updated as appropriate:      Objective  Well appearing patient in no apparent distress; mood and affect are within normal limits.  A focused examination was performed including scalp. Relevant physical exam findings are noted in the Assessment and Plan.  Objective  crown, posterior scalp: Frontal loss significant. Scalp is boggy and tender   Assessment & Plan  Alopecia crown, posterior scalp  Location: Scalp  Informed Consent: Discussed risks (infection, pain, bleeding, bruising, thinning of the skin, loss of skin pigment, lack of resolution, and recurrence of lesion) and benefits of the procedure, as well as the alternatives. Informed consent was obtained. Preparation: The area was prepared a standard fashion.  Procedure Details: An intralesional injection was performed with Kenalog 10 mg/cc. 3 cc in total were injected.  Total number of injections: 10  Plan: The patient was instructed on post-op care. Recommend OTC analgesia as needed for pain.  clobetasol (TEMOVATE) 0.05 % external solution - crown, posterior scalp  triamcinolone acetonide (KENALOG) 10 MG/ML injection 10 mg - crown, posterior scalp    I, Dalynn Jhaveri, PA-C, have reviewed all documentation's for this visit.  The documentation on 10/13/20 for the exam, diagnosis, procedures and orders are all accurate and complete.

## 2020-12-02 ENCOUNTER — Other Ambulatory Visit: Payer: Self-pay | Admitting: Neurology

## 2021-01-06 ENCOUNTER — Telehealth: Payer: Self-pay | Admitting: Family Medicine

## 2021-01-06 NOTE — Telephone Encounter (Signed)
Pt is wondering if her mother is allowed to est with you as a pcp, her current pcp is leaving and is in need of someone to continue her care.

## 2021-01-15 NOTE — Telephone Encounter (Signed)
Yes ok 

## 2021-01-16 ENCOUNTER — Ambulatory Visit: Payer: No Typology Code available for payment source | Admitting: Physician Assistant

## 2021-01-19 ENCOUNTER — Ambulatory Visit: Payer: No Typology Code available for payment source | Admitting: Physician Assistant

## 2021-02-18 ENCOUNTER — Other Ambulatory Visit: Payer: Self-pay

## 2021-02-18 ENCOUNTER — Encounter (HOSPITAL_BASED_OUTPATIENT_CLINIC_OR_DEPARTMENT_OTHER): Payer: Self-pay | Admitting: Emergency Medicine

## 2021-02-18 ENCOUNTER — Emergency Department (HOSPITAL_BASED_OUTPATIENT_CLINIC_OR_DEPARTMENT_OTHER)
Admission: EM | Admit: 2021-02-18 | Discharge: 2021-02-18 | Disposition: A | Discharge status: Home / Self Care | Payer: No Typology Code available for payment source | Attending: Emergency Medicine | Admitting: Emergency Medicine

## 2021-02-18 DIAGNOSIS — M7989 Other specified soft tissue disorders: Secondary | ICD-10-CM | POA: Diagnosis not present

## 2021-02-18 DIAGNOSIS — R600 Localized edema: Secondary | ICD-10-CM | POA: Diagnosis not present

## 2021-02-18 DIAGNOSIS — M79662 Pain in left lower leg: Secondary | ICD-10-CM | POA: Diagnosis present

## 2021-02-18 DIAGNOSIS — Z8541 Personal history of malignant neoplasm of cervix uteri: Secondary | ICD-10-CM | POA: Insufficient documentation

## 2021-02-18 DIAGNOSIS — X500XXA Overexertion from strenuous movement or load, initial encounter: Secondary | ICD-10-CM | POA: Insufficient documentation

## 2021-02-18 DIAGNOSIS — J45909 Unspecified asthma, uncomplicated: Secondary | ICD-10-CM | POA: Diagnosis not present

## 2021-02-18 DIAGNOSIS — M79605 Pain in left leg: Secondary | ICD-10-CM

## 2021-02-18 MED ORDER — ENOXAPARIN SODIUM 120 MG/0.8ML IJ SOSY
1.0000 mg/kg | PREFILLED_SYRINGE | Freq: Once | INTRAMUSCULAR | Status: AC
  Administered 2021-02-18: 120 mg via SUBCUTANEOUS
  Filled 2021-02-18: qty 0.8

## 2021-02-18 NOTE — ED Notes (Signed)
See EDP assessment 

## 2021-02-18 NOTE — ED Provider Notes (Signed)
[facility] EMERGENCY DEPARTMENT Provider Note   CSN: [id number] Arrival date & time: 02/18/21  2045     History Chief Complaint  Patient presents with   Leg Pain    Meredith Barnes is a 45 y.o. female.  The history is provided by the patient.  Leg Pain Location:  Leg Injury: no   Leg location:  L lower leg Pain details:    Quality:  Aching and cramping   Severity:  Moderate   Onset quality:  Gradual   Duration:  2 days   Timing:  Constant   Progression:  Worsening Chronicity:  New Relieved by: rest. Worsened by:  Bearing weight Associated symptoms: no fever and no muscle weakness   Patient reports left leg pain and swelling for over 2 days.  No trauma.  She is concerned about a blood clot She is not on estrogen products She is a nonsmoker    Past Medical History:  Diagnosis Date   Alopecia    Cyst, ovary, follicular    Diverticulitis    Ectopic pregnancy    H. pylori infection    Hx of hysterectomy partial for cervicl CA 2003   Migraine headache    Sigmoidoscopy exam partial sigmoid colectomy 07-23-08    Patient Active Problem List   Diagnosis Date Noted   Alopecia of scalp 02/01/2020   Tenosynovitis of foot and ankle 12/23/2013   IFG (impaired fasting glucose) 05/27/2012   OVARIAN CYST 02/07/2009   Migraine without aura 05/13/2007   HEMATURIA 09/23/2006   ASTHMA 06/20/2006   ECZEMA 06/20/2006   ANEMIA, IRON DEFICIENCY, UNSPEC. 04/30/2006   FATIGUE/MALAISE 04/30/2006    Past Surgical History:  Procedure Laterality Date   ABDOMINAL HYSTERECTOMY     ECTOPIC PREGNANCY SURGERY     FOOT SURGERY     HERNIA REPAIR     OOPHORECTOMY     partial sigmoid colectomy 07-23-08  07-23-08   TOTAL VAGINAL HYSTERECTOMY  2003   Cervical Cancer     OB History   No obstetric history on file.     Family History  Problem Relation Age of Onset   Stroke Father     Social History   Tobacco Use   Smoking status: Never   Smokeless tobacco: Never   Vaping Use   Vaping Use: Never used  Substance Use Topics   Alcohol use: No   Drug use: No    Home Medications Prior to Admission medications   Medication Sig Start Date End Date Taking? Authorizing Provider  clobetasol (TEMOVATE) 0.05 % external solution Apply 1 application topically 2 (two) times daily. 10/12/20   Sheffield, Harvin Hazel R, PA-C  fluticasone (FLONASE) 50 MCG/ACT nasal spray Place 2 sprays into both nostrils daily for 7 days. 08/10/20 08/17/20  Long, Arlyss Repress, MD  fluticasone (FLOVENT HFA) 44 MCG/ACT inhaler Inhale 2 puffs into the lungs 2 (two) times daily. Patient not taking: Reported on 10/12/2020 09/05/20   Hyman Hopes B, NP  valACYclovir (VALTREX) 500 MG tablet TAKE 1 TABLET (500 MG TOTAL) BY MOUTH 2 (TWO) TIMES DAILY AS NEEDED. FOR 3 TO 5 DAYS AT THE FIRST SIGN OF OUTBREAK Patient not taking: Reported on 10/12/2020 07/04/20   Tollie Eth, NP    Allergies    Iodinated diagnostic agents, Iodine, and Tape  Review of Systems   Review of Systems  Constitutional:  Negative for fever.  HENT:  Negative for nosebleeds.   Respiratory:  Negative for shortness of breath.   Cardiovascular:  Negative for chest pain.  Gastrointestinal:  Negative for blood in stool.  Genitourinary:  Negative for vaginal bleeding.  All other systems reviewed and are negative.  Physical Exam Updated Vital Signs BP 128/83   Pulse 75   Temp 98.4 F (36.9 C) (Oral)   Resp 19   Ht 1.651 m (5\' 5" )   Wt 120.2 kg   SpO2 100%   BMI 44.10 kg/m   Physical Exam CONSTITUTIONAL: Well developed/well nourished HEAD: Normocephalic/atraumatic EYES: EOMI ENMT: Mucous membranes moist NECK: supple no meningeal signs CV: S1/S2 noted LUNGS: Lungs are clear to auscultation bilaterally ABDOMEN: soft NEURO: Pt is awake/alert/appropriate, moves all extremitiesx4.  No facial droop.   EXTREMITIES: pulses normal/equal, full ROM, minimal pitting edema to left LE when compared to right leg.  Full range of motion  of left ankle and knee.  No obvious knee effusion.  Left calf tenderness noted.  Distal pulses equal/intact No erythema.  No crepitus or bruising.  No signs of trauma.  No lacerations or abrasions SKIN: warm, color normal PSYCH: no abnormalities of mood noted, alert and oriented to situation  ED Results / Procedures / Treatments   Labs (all labs ordered are listed, but only abnormal results are displayed) Labs Reviewed - No data to display  EKG None  Radiology No results found.  Procedures Procedures   Medications Ordered in ED Medications  enoxaparin (LOVENOX) injection 120 mg (120 mg Subcutaneous Given 02/18/21 2341)    ED Course  I have reviewed the triage vital signs and the nursing notes.  MDM Rules/Calculators/A&P                           Will have patient return tomorrow morning for a DVT study of left leg.  Also suspect ruptured Baker's cyst. No signs of cellulitis. Patient is low risk for bleeding, one-time dose of Lovenox Final Clinical Impression(s) / ED Diagnoses Final diagnoses:  Left leg pain    Rx / DC Orders ED Discharge Orders          Ordered    2342 Venous Img Lower Unilateral Left        02/18/21 2328             [id number], MD 02/18/21 2354

## 2021-02-18 NOTE — ED Triage Notes (Signed)
Pt reports LT calf pain since Thurs; was told by UC that pain ws d/t ruptured Baker's cyst; pt is concerned for DVT

## 2021-02-18 NOTE — Discharge Instructions (Addendum)
PLEASE RETURN IN THE MORNING AT 9AM HERE AT [facility] FOR YOUR ULTRASOUND OF YOUR LEFT LEG

## 2021-02-19 ENCOUNTER — Ambulatory Visit (HOSPITAL_BASED_OUTPATIENT_CLINIC_OR_DEPARTMENT_OTHER)
Admit: 2021-02-19 | Discharge: 2021-02-19 | Disposition: A | Discharge status: Home / Self Care | Payer: No Typology Code available for payment source | Attending: Emergency Medicine | Admitting: Emergency Medicine

## 2021-02-20 ENCOUNTER — Telehealth: Payer: Self-pay | Admitting: General Practice

## 2021-02-20 NOTE — Telephone Encounter (Signed)
Transition Care Management Follow-up Telephone Call Date of discharge and from where: 02/18/21 from [facility] How have you been since you were released from the hospital? Doing ok. Any questions or concerns? No  Items Reviewed: Did the pt receive and understand the discharge instructions provided? Yes  Medications obtained and verified? Yes  Other? No  Any new allergies since your discharge? No  Dietary orders reviewed? Yes Do you have support at home? Yes   Home Care and Equipment/Supplies: Were home health services ordered? no  Functional Questionnaire: (I = Independent and D = Dependent) ADLs: I  Bathing/Dressing- I  Meal Prep- I  Eating- I  Maintaining continence- I  Transferring/Ambulation- I  Managing Meds- I  Follow up appointments reviewed:  PCP Hospital f/u appt confirmed? Yes  Scheduled to see Tandy Gaw, PA on 02/27/21 @ 0830. Specialist Hospital f/u appt confirmed? Yes  Scheduled to see Dr. Thamas Jaegers on 02/23/21 @ 1020. Are transportation arrangements needed? No  If their condition worsens, is the pt aware to call PCP or go to the Emergency Dept.? Yes Was the patient provided with contact information for the PCP's office or ED? Yes Was to pt encouraged to call back with questions or concerns? Yes

## 2021-02-27 ENCOUNTER — Ambulatory Visit (INDEPENDENT_AMBULATORY_CARE_PROVIDER_SITE_OTHER): Payer: No Typology Code available for payment source | Admitting: Physician Assistant

## 2021-02-27 ENCOUNTER — Encounter: Payer: Self-pay | Admitting: Physician Assistant

## 2021-02-27 VITALS — BP 124/73 | HR 80 | Ht 65.0 in | Wt 263.0 lb

## 2021-02-27 DIAGNOSIS — R143 Flatulence: Secondary | ICD-10-CM | POA: Insufficient documentation

## 2021-02-27 DIAGNOSIS — N898 Other specified noninflammatory disorders of vagina: Secondary | ICD-10-CM | POA: Diagnosis not present

## 2021-02-27 DIAGNOSIS — Z113 Encounter for screening for infections with a predominantly sexual mode of transmission: Secondary | ICD-10-CM | POA: Diagnosis not present

## 2021-02-27 DIAGNOSIS — Z9049 Acquired absence of other specified parts of digestive tract: Secondary | ICD-10-CM | POA: Diagnosis not present

## 2021-02-27 DIAGNOSIS — M1712 Unilateral primary osteoarthritis, left knee: Secondary | ICD-10-CM

## 2021-02-27 LAB — POCT URINALYSIS DIP (CLINITEK)
Bilirubin, UA: NEGATIVE
Glucose, UA: NEGATIVE mg/dL
Ketones, POC UA: NEGATIVE mg/dL
Nitrite, UA: NEGATIVE
POC PROTEIN,UA: NEGATIVE
Spec Grav, UA: 1.015 (ref 1.010–1.025)
Urobilinogen, UA: 0.2 E.U./dL
pH, UA: 5.5 (ref 5.0–8.0)

## 2021-02-27 MED ORDER — METRONIDAZOLE 500 MG PO TABS: 500.0000 mg | ORAL_TABLET | Freq: Two times a day (BID) | ORAL | 0 refills | Status: AC

## 2021-02-27 NOTE — Progress Notes (Signed)
Subjective:    Patient ID: Meredith Barnes, female    DOB: 29-May-1976, 45 y.o.   MRN: [id number]  HPI Pt is a 45 yo obese female who presents to the clinic for follow up from ED visit 02/18/21 for left leg pain. She was found to have OA in left knee. Seen orthopedic and knee was drained and injected. She is doing much better now.   She c/o vaginal discharge and odor for 2 weeks. No dysuria, abdominal or flank pain. No new sexual partners. No fever, chills, body aches.   She is also very gassy for last few months. Hx of diverticulitis and had colonoscopy before. [place] does not stink. Not related to bowel movements. Some bowel movements are not full but some are. No melena or hematochezia.   Marland Kitchen. Active Ambulatory Problems    Diagnosis Date Noted   ANEMIA, IRON DEFICIENCY, UNSPEC. 04/30/2006   Migraine without aura 05/13/2007   ASTHMA 06/20/2006   HEMATURIA 09/23/2006   OVARIAN CYST 02/07/2009   ECZEMA 06/20/2006   FATIGUE/MALAISE 04/30/2006   IFG (impaired fasting glucose) 05/27/2012   Tenosynovitis of foot and ankle 12/23/2013   Alopecia of scalp 02/01/2020   Flatulence 02/27/2021   History of partial colectomy 02/27/2021   Primary osteoarthritis of left knee 02/27/2021   Resolved Ambulatory Problems    Diagnosis Date Noted   URI 08/12/2009   BACK PAIN, THORACIC REGION 02/24/2009   SWELLING, NECK 03/11/2008   PELVIC  PAIN 10/18/2006   Abdominal tenderness, left lower quadrant 01/07/2009   MAMMOGRAM, ABNORMAL, LEFT 10/18/2006   Edema 12/25/2013   Pain in lower limb 12/25/2013   Past Medical History:  Diagnosis Date   Alopecia    Cyst, ovary, follicular    Diverticulitis    Ectopic pregnancy    H. pylori infection    Hx of hysterectomy partial for cervicl CA 2003   Migraine headache    Sigmoidoscopy exam partial sigmoid colectomy 07-23-08       Review of Systems See HPI.     Objective:   Physical Exam Vitals reviewed.  Constitutional:      Appearance: Normal  appearance. She is obese.  HENT:     Head: Normocephalic.  Cardiovascular:     Rate and Rhythm: Normal rate and regular rhythm.     Pulses: Normal pulses.  Pulmonary:     Effort: Pulmonary effort is normal.  Abdominal:     General: Bowel sounds are normal. There is no distension.     Palpations: Abdomen is soft. There is no mass.     Tenderness: There is no abdominal tenderness. There is no right CVA tenderness, left CVA tenderness, guarding or rebound.  Musculoskeletal:     Right lower leg: No edema.     Left lower leg: No edema.  Neurological:     General: No focal deficit present.     Mental Status: She is alert.  Psychiatric:        Mood and Affect: Mood normal.      .. Results for orders placed or performed in visit on 02/27/21  POCT URINALYSIS DIP (CLINITEK)  Result Value Ref Range   Color, UA yellow yellow   Clarity, UA cloudy (A) clear   Glucose, UA negative negative mg/dL   Bilirubin, UA negative negative   Ketones, POC UA negative negative mg/dL   Spec Grav, UA 7.564 3.329 - 1.025   Blood, UA small (A) negative   pH, UA 5.5 5.0 - 8.0  POC PROTEIN,UA negative negative, trace   Urobilinogen, UA 0.2 0.2 or 1.0 E.U./dL   Nitrite, UA Negative Negative   Leukocytes, UA Large (3+) (A) Negative       Assessment & Plan:  Marland KitchenMarland KitchenMakibu was seen today for hospitalization follow-up.  Diagnoses and all orders for this visit:  Vaginal discharge -     WET PREP FOR TRICH, YEAST, CLUE -     Urine Culture -     metroNIDAZOLE (FLAGYL) 500 MG tablet; Take 1 tablet (500 mg total) by mouth 2 (two) times daily for 7 days. -     HIV antibody (with reflex) -     RPR -     COMPLETE METABOLIC PANEL WITH GFR -     Cancel: HSV(herpes smplx)abs-1+2(IgG+IgM)-bld -     HSV(herpes simplex vrs) 1+2 ab-IgG -     POCT URINALYSIS DIP (CLINITEK) -     C. trachomatis/N. gonorrhoeae RNA  Vaginal odor -     WET PREP FOR TRICH, YEAST, CLUE -     Urine Culture -     metroNIDAZOLE (FLAGYL)  500 MG tablet; Take 1 tablet (500 mg total) by mouth 2 (two) times daily for 7 days. -     HSV(herpes simplex vrs) 1+2 ab-IgG -     POCT URINALYSIS DIP (CLINITEK) -     C. trachomatis/N. gonorrhoeae RNA  Flatulence  Screening examination for STD (sexually transmitted disease) -     HIV antibody (with reflex) -     RPR -     COMPLETE METABOLIC PANEL WITH GFR -     Cancel: HSV(herpes smplx)abs-1+2(IgG+IgM)-bld -     HSV(herpes simplex vrs) 1+2 ab-IgG -     C. trachomatis/N. gonorrhoeae RNA  History of partial colectomy  Primary osteoarthritis of left knee  Likely discharge is BV. Started empirical treatment.  STD screening done today.  Wet prep done today.  UA positive for small blood and leuks. Will culture.  Keep food diary to look for causes to gas.  Start probiotic.  Colace for stool softenor.  Follow up as needed or if symptoms change or worsen.

## 2021-02-27 NOTE — Progress Notes (Signed)
No yeast but trichomonas and bacterial vaginosis seen. Metronidazole will treat both!

## 2021-02-27 NOTE — Patient Instructions (Addendum)
Align probiotic.  Colace-stool softener.   Low-FODMAP Eating Plan  FODMAP stands for fermentable oligosaccharides, disaccharides, monosaccharides, and polyols. These are sugars that are hard for some people to digest. A low-FODMAP eating plan may help some people who have irritable bowel syndrome (IBS) and certain other bowel (intestinal) diseases to manage their symptoms. This meal plan can be complicated to follow. Work with a diet and nutrition specialist (dietitian) to make a low-FODMAP eating plan that is right for you. A dietitian can helpmake sure that you get enough nutrition from this diet. What are tips for following this plan? Reading food labels Check labels for hidden FODMAPs such as: High-fructose syrup. Honey. Agave. Natural fruit flavors. Onion or garlic powder. Choose low-FODMAP foods that contain 3-4 grams of fiber per serving. Check food labels for serving sizes. Eat only one serving at a time to make sure FODMAP levels stay low. Shopping Shop with a list of foods that are recommended on this diet and make a meal plan. Meal planning Follow a low-FODMAP eating plan for up to 6 weeks, or as told by your health care provider or dietitian. To follow the eating plan: Eliminate high-FODMAP foods from your diet completely. Choose only low-FODMAP foods to eat. You will do this for 2-6 weeks. Gradually reintroduce high-FODMAP foods into your diet one at a time. Most people should wait a few days before introducing the next new high-FODMAP food into their meal plan. Your dietitian can recommend how quickly you may reintroduce foods. Keep a daily record of what and how much you eat and drink. Make note of any symptoms that you have after eating. Review your daily record with a dietitian regularly to identify which foods you can eat and which foods you should avoid. General tips Drink enough fluid each day to keep your urine pale yellow. Avoid processed foods. These often have  added sugar and may be high in FODMAPs. Avoid most dairy products, whole grains, and sweeteners. Work with a dietitian to make sure you get enough fiber in your diet. Avoid high FODMAP foods at meals to manage symptoms. Recommended foods Fruits Bananas, oranges, tangerines, lemons, limes, blueberries, raspberries, strawberries, grapes, cantaloupe, honeydew melon, kiwi, papaya, passion fruit, and pineapple. Limited amounts of dried cranberries, banana chips, and shreddedcoconut. Vegetables Eggplant, zucchini, cucumber, peppers, green beans, bean sprouts, lettuce, arugula, kale, Swiss chard, spinach, collard greens, bok choy, summer squash, potato, and tomato. Limited amounts of corn, carrot, and sweet potato. Greenparts of scallions. Grains Gluten-free grains, such as rice, oats, buckwheat, quinoa, corn, polenta, andmillet. Gluten-free pasta, bread, or cereal. Rice noodles. Corn tortillas. Meats and other proteins Unseasoned beef, pork, poultry, or fish. Eggs. Tomasa Blase. Tofu (firm) and tempeh. Limited amounts of nuts and seeds, such as almonds, walnuts, [place] nuts,pecans, peanuts, nut butters, pumpkin seeds, chia seeds, and sunflower seeds. Dairy Lactose-free milk, yogurt, and kefir. Lactose-free cottage cheese and ice cream. Non-dairy milks, such as almond, coconut, hemp, and rice milk. Non-dairy yogurt. Limited amounts of goat cheese, brie, mozzarella, parmesan, swiss, andother hard cheeses. Fats and oils Butter-free spreads. Vegetable oils, such as olive, canola, and sunflower oil. Seasoning and other foods Artificial sweeteners with names that do not end in "ol," such as aspartame, saccharine, and stevia. Maple syrup, white table sugar, raw sugar, brown sugar, and molasses. Mayonnaise, soy sauce, and tamari. Fresh basil, coriander,parsley, rosemary, and thyme. Beverages Water and mineral water. Sugar-sweetened soft drinks. Small amounts of orangejuice or cranberry juice. Black and green tea.  Most dry wines.  Coffee. The items listed above may not be a complete list of foods and beverages you can eat. Contact a dietitian for more information. Foods to avoid Fruits Fresh, dried, and juiced forms of apple, pear, watermelon, peach, plum, cherries, apricots, blackberries, boysenberries, figs, nectarines, and mango.Avocado. Vegetables Chicory root, artichoke, asparagus, cabbage, snow peas, Brussels sprouts, broccoli, sugar snap peas, mushrooms, celery, and cauliflower. Onions, garlic,leeks, and the white part of scallions. Grains Wheat, including kamut, durum, and semolina. Barley and bulgur. Couscous.Wheat-based cereals. Wheat noodles, bread, crackers, and pastries. Meats and other proteins Fried or fatty meat. Sausage. Cashews and pistachios. Soybeans, baked beans, black beans, chickpeas, kidney beans, fava beans, navy beans, lentils,black-eyed peas, and split peas. Dairy Milk, yogurt, ice cream, and soft cheese. Cream and sour cream. Milk-basedsauces. Custard. Buttermilk. Soy milk. Seasoning and other foods Any sugar-free gum or candy. Foods that contain artificial sweeteners such as sorbitol, mannitol, isomalt, or xylitol. Foods that contain honey, high-fructose corn syrup, or agave. Bouillon, vegetable stock, beef stock, and chicken stock. Garlic and onion powder. Condiments made with onion, such ashummus, chutney, pickles, relish, salad dressing, and salsa. Tomato paste. Beverages Chicory-based drinks. Coffee substitutes. Chamomile tea. Fennel tea. Sweet or fortified wines such as port or sherry. Diet soft drinks made with isomalt, mannitol, maltitol, sorbitol, or xylitol. Apple, pear, and mango juice. Juiceswith high-fructose corn syrup. The items listed above may not be a complete list of foods and beverages you should avoid. Contact a dietitian for more information. Summary FODMAP stands for fermentable oligosaccharides, disaccharides, monosaccharides, and polyols. These are sugars  that are hard for some people to digest. A low-FODMAP eating plan is a short-term diet that helps to ease symptoms of certain bowel diseases. The eating plan usually lasts up to 6 weeks. After that, high-FODMAP foods are reintroduced gradually and one at a time. This can help you find out which foods may be causing symptoms. A low-FODMAP eating plan can be complicated. It is best to work with a dietitian who has experience with this type of plan. This information is not intended to replace advice given to you by your health care provider. Make sure you discuss any questions you have with your healthcare provider. Document Revised: 11/26/2019 Document Reviewed: 11/26/2019 Elsevier Patient Education  2022 Elsevier Inc.   Bacterial Vaginosis  Bacterial vaginosis is an infection of the vagina. It happens when too many normal germs (healthy bacteria) grow in the vagina. This infection can make it easier to get other infectionsfrom sex (STIs). It is very important for pregnant women to get treated. This infection cancause babies to be born early or at a low birth weight. What are the causes? This infection is caused by an increase in certain germs that grow in [place]. You cannot get this infection from toilet seats, bedsheets, swimming pools, orthings that touch your vagina. What increases the risk? Having sex with a new person or more than one person. Having sex without protection. Douching. Having an intrauterine device (IUD). Smoking. Using drugs or drinking alcohol. These can lead you to do things that are risky. Taking certain antibiotic medicines. Being pregnant. What are the signs or symptoms? Some women have no symptoms. Symptoms may include: A discharge from your vagina. It may be gray or white. It can be watery or foamy. A fishy smell. This can happen after sex or during your menstrual period. Itching in and around your vagina. A feeling of burning or pain when you pee  (urinate). How is this treated? This  infection is treated with antibiotic medicines. These may be given to you as: A pill. A cream for your vagina. A medicine that you put into your vagina (suppository). If the infection comes back after treatment, you may need more antibiotics. Follow these instructions at home: Medicines Take over-the-counter and prescription medicines as told by your doctor. Take or use your antibiotic medicine as told by your doctor. Do not stop taking or using it, even if you start to feel better. General instructions If the person you have sex with is a woman, tell her that you have this infection. She will need to follow up with her doctor. If you have a female partner, he does not need to be treated. Do not have sex until you finish treatment. Drink enough fluid to keep your pee pale yellow. Keep your vagina and butt clean. Wash the area with warm water each day. Wipe from front to back after you use the toilet. If you are breastfeeding a baby, ask your doctor if you should keep doing so during treatment. Keep all follow-up visits. How is this prevented? Self-care Do not douche. Use only warm water to wash around your vagina. Wear underwear that is cotton or lined with cotton. Do not wear tight pants and pantyhose, especially in the summer. Safe sex Use protection when you have sex. This includes: Use condoms. Use dental dams. This is a thin layer that protects the mouth during oral sex. Limit how many people you have sex with. To prevent this infection, it is best to have sex with just one person. Get tested for STIs. The person you have sex with should also get tested. Drugs and alcohol Do not smoke or use any products that contain nicotine or tobacco. If you need help quitting, ask your doctor. Do not use drugs. Do not drink alcohol if: Your doctor tells you not to drink. You are pregnant, may be pregnant, or are planning to become pregnant. If you  drink alcohol: Limit how much you have to 0-1 drink a day. Know how much alcohol is in your drink. In the [place] one drink equals one 12 oz bottle of beer (355 mL), one 5 oz glass of wine (148 mL), or one 1 oz glass of hard liquor (44 mL). Where to find more information Centers for Disease Control and Prevention: FootballExhibition.com.br American Sexual Health Association: www.ashastd.org Office on [employer] Health: http://hoffman.com/ Contact a doctor if: Your symptoms do not get better, even after you are treated. You have more discharge or pain when you pee. You have a fever or chills. You have pain in your belly (abdomen) or in the area between your hips. You have pain with sex. You bleed from your vagina between menstrual periods. Summary This infection can happen when too many germs (bacteria) grow in the vagina. This infection can make it easier to get infections from sex (STIs). Treating this can lower that chance. Get treated if you are pregnant. This infection can cause babies to be born early. Do not stop taking or using your antibiotic medicine, even if you start to feel better. This information is not intended to replace advice given to you by your health care provider. Make sure you discuss any questions you have with your healthcare provider. Document Revised: 01/07/2020 Document Reviewed: 01/07/2020 Elsevier Patient Education  2022 [employer].

## 2021-02-28 LAB — COMPLETE METABOLIC PANEL WITH GFR
AG Ratio: 1.3 (calc) (ref 1.0–2.5)
ALT: 19 U/L (ref 6–29)
AST: 14 U/L (ref 10–30)
Albumin: 4.4 g/dL (ref 3.6–5.1)
Alkaline phosphatase (APISO): 69 U/L (ref 31–125)
BUN: 10 mg/dL (ref 7–25)
CO2: 30 mmol/L (ref 20–32)
Calcium: 10 mg/dL (ref 8.6–10.2)
Chloride: 104 mmol/L (ref 98–110)
Creat: 0.71 mg/dL (ref 0.50–0.99)
Globulin: 3.3 g/dL (calc) (ref 1.9–3.7)
Glucose, Bld: 99 mg/dL (ref 65–99)
Potassium: 4 mmol/L (ref 3.5–5.3)
Sodium: 142 mmol/L (ref 135–146)
Total Bilirubin: 0.3 mg/dL (ref 0.2–1.2)
Total Protein: 7.7 g/dL (ref 6.1–8.1)
eGFR: 108 mL/min/{1.73_m2} (ref >=60)

## 2021-02-28 LAB — WET PREP FOR TRICH, YEAST, CLUE
MICRO NUMBER:: 12213399
Specimen Quality: ADEQUATE

## 2021-02-28 LAB — URINE CULTURE
MICRO NUMBER:: 12213400
SPECIMEN QUALITY:: ADEQUATE

## 2021-02-28 LAB — HIV ANTIBODY (ROUTINE TESTING W REFLEX): HIV 1&2 Ab, 4th Generation: NONREACTIVE

## 2021-02-28 LAB — C. TRACHOMATIS/N. GONORRHOEAE RNA
C. trachomatis RNA, TMA: NOT DETECTED
N. gonorrhoeae RNA, TMA: NOT DETECTED

## 2021-02-28 LAB — RPR: RPR Ser Ql: NONREACTIVE

## 2021-02-28 LAB — HSV(HERPES SIMPLEX VRS) I + II AB-IGG
HAV 1 IGG,TYPE SPECIFIC AB: 42.6 index — ABNORMAL HIGH
HSV 2 IGG,TYPE SPECIFIC AB: 16.3 index — ABNORMAL HIGH

## 2021-02-28 NOTE — Progress Notes (Signed)
Meredith Barnes,   Kidney, liver, glucose look great.  Some testing still pending.

## 2021-02-28 NOTE — Progress Notes (Signed)
Negative GC/Chlamydia/HIV.

## 2021-02-28 NOTE — Progress Notes (Signed)
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

## 2021-03-20 ENCOUNTER — Telehealth: Payer: Self-pay

## 2021-03-20 NOTE — Telephone Encounter (Signed)
Pt called stating that she has had a terrible cough.  She has had several negative COVID tests.  She states that the cough is worse when it is hot outside.  She has tried Mucinex, codeine cough medication that she had left over from January, 2022, and [employer].  Pt informed she will need virtual OV.  Tiajuana Amass, CMA

## 2021-03-21 ENCOUNTER — Encounter: Payer: Self-pay | Admitting: Family Medicine

## 2021-03-21 ENCOUNTER — Telehealth (INDEPENDENT_AMBULATORY_CARE_PROVIDER_SITE_OTHER): Payer: No Typology Code available for payment source | Admitting: Family Medicine

## 2021-03-21 DIAGNOSIS — R058 Other specified cough: Secondary | ICD-10-CM

## 2021-03-21 DIAGNOSIS — S46812A Strain of other muscles, fascia and tendons at shoulder and upper arm level, left arm, initial encounter: Secondary | ICD-10-CM

## 2021-03-21 MED ORDER — HYDROCODONE BIT-HOMATROP MBR 5-1.5 MG/5ML PO SOLN: 5.0000 mL | Freq: Every evening | ORAL | 0 refills | Status: DC | PRN

## 2021-03-21 MED ORDER — PREDNISONE 20 MG PO TABS
40.0000 mg | ORAL_TABLET | Freq: Every day | ORAL | 0 refills | Status: DC
Start: 2021-03-21 — End: 2021-04-03

## 2021-03-21 NOTE — Progress Notes (Signed)
Virtual Visit via Video Note  I connected with Meredith Barnes on 03/21/21 at 10:10 AM EDT by a video enabled telemedicine application and verified that I am speaking with the correct person using two identifiers.   I discussed the limitations of evaluation and management by telemedicine and the availability of in person appointments. The patient expressed understanding and agreed to proceed.  Patient location: at home Provider location: in office  Subjective:    CC: Dry cough     HPI: Dry cough x 3 weeks. She has tried mucinex, Catering manager and used her inhaler.  She says that even the inhaler did not help.  She also had some old cough medicine and says it helped initially but then she ran out.  No fevers or chills.  No postnasal drip.  No recent heartburn or reflux symptoms.  Once in a while she will cough up a little bit of phlegm.  No other rash or GI symptoms.  She says her daughter had COVID about 3 weeks ago but she said she did 2 home test and went to the local pharmacy and tested negative with each of those.   She reports last night she got light headed, nauseated, headache that has continued to today mostly L sided.    Have been having some pain over the top of her left posterior shoulder on her upper back.  Sometimes it radiates down past her shoulder into her upper arm and wants to know what she could take or use.  Past medical history, Surgical history, Family history not pertinant except as noted below, Social history, Allergies, and medications have been entered into the medical record, reviewed, and corrections made.    Objective:    General: Speaking clearly in complete sentences without any shortness of breath.  Alert and oriented x3.  Normal judgment. No apparent acute distress.    Impression and Recommendations:    No problem-specific Assessment & Plan notes found for this encounter.  Dry cough-suspect viral etiology.  Though she has had symptoms for about 3 weeks  at this point.  No other contributing factors such as GERD or postnasal drip.  We will treat with a round of prednisone if not better after 5 days then consider chest x-ray for further work-up she does feel like the cough is coming from her upper chest.  Also refill the cough medication.  She is having pain in the left trapezius muscle we will send some exercises through her AVS for her to work on at home.  Discussed using a tennis ball against the wall or even a door frame to put pressure on that muscle also work on gentle stretches.  No orders of the defined types were placed in this encounter.   Meds ordered this encounter  Medications   predniSONE (DELTASONE) 20 MG tablet    Sig: Take 2 tablets (40 mg total) by mouth daily with breakfast.    Dispense:  10 tablet    Refill:  0   HYDROcodone bit-homatropine (HYCODAN) 5-1.5 MG/5ML syrup    Sig: Take 5 mLs by mouth at bedtime as needed for cough.    Dispense:  60 mL    Refill:  0     I discussed the assessment and treatment plan with the patient. The patient was provided an opportunity to ask questions and all were answered. The patient agreed with the plan and demonstrated an understanding of the instructions.   The patient was advised to call back or  seek an in-person evaluation if the symptoms worsen or if the condition fails to improve as anticipated.   Nani Gasser, MD

## 2021-03-21 NOTE — Progress Notes (Signed)
Dry cough x 3 weeks. She has tried mucinex, Catering manager and used her inhaler.   She reports last night she got light headed, nauseated, headache that has continued to today mostly L sided.

## 2021-03-30 ENCOUNTER — Telehealth: Payer: Self-pay | Admitting: *Deleted

## 2021-03-30 ENCOUNTER — Ambulatory Visit (INDEPENDENT_AMBULATORY_CARE_PROVIDER_SITE_OTHER): Payer: No Typology Code available for payment source

## 2021-03-30 ENCOUNTER — Other Ambulatory Visit: Payer: Self-pay

## 2021-03-30 DIAGNOSIS — R053 Chronic cough: Secondary | ICD-10-CM | POA: Diagnosis not present

## 2021-03-30 DIAGNOSIS — R058 Other specified cough: Secondary | ICD-10-CM

## 2021-03-30 NOTE — Progress Notes (Signed)
Hi Meredith Barnes,  Your chest x-ray looks good so no sign of pneumonia or fluid which is great.  That means the cough is probably remaining from the recent infection we caught a postinfectious cough.  It can be part of the healing phase and can linger for several weeks which is really frustrating.  I think we were able to get the medication corrected so that she can at least take it in the evenings to try to get some sleep and see if that calms the cough down.  I also recommend taking a reflux medicine such as Prilosec or Nexium I know it sounds strange to take that but it can help with a persistent cough.

## 2021-03-30 NOTE — Telephone Encounter (Addendum)
Pt called and stated that she finished the prednisone and the cough syrup could not be filled so she never got this. She still has a cough and is wheezing now.   She has had this cough for 3 weeks. She would like to move forward with the CXR since she still has the cough. And would like to know if there is anything else she should do at this time.  Called pt and informed her to go have CXR.   Will call pharmacy to check on cough syrup.

## 2021-04-03 ENCOUNTER — Encounter: Payer: Self-pay | Admitting: Family Medicine

## 2021-04-03 MED ORDER — HYDROCODONE BIT-HOMATROP MBR 5-1.5 MG/5ML PO SOLN: 5.0000 mL | Freq: Three times a day (TID) | ORAL | 0 refills | Status: DC | PRN

## 2021-04-03 NOTE — Telephone Encounter (Signed)
We had given pharmacy a verbal so not sure what happened. Sent a new rx today.

## 2021-04-10 ENCOUNTER — Encounter: Payer: Self-pay | Admitting: Family Medicine

## 2021-04-10 NOTE — Telephone Encounter (Signed)
Sounds more MSK and maybe pinched nerve. Lets get her in with Dr. Karie Schwalbe

## 2021-04-18 ENCOUNTER — Other Ambulatory Visit: Payer: Self-pay

## 2021-04-18 ENCOUNTER — Ambulatory Visit (INDEPENDENT_AMBULATORY_CARE_PROVIDER_SITE_OTHER): Payer: No Typology Code available for payment source | Admitting: Sports Medicine

## 2021-04-18 ENCOUNTER — Ambulatory Visit (INDEPENDENT_AMBULATORY_CARE_PROVIDER_SITE_OTHER): Payer: No Typology Code available for payment source

## 2021-04-18 ENCOUNTER — Encounter: Payer: Self-pay | Admitting: Sports Medicine

## 2021-04-18 DIAGNOSIS — M5412 Radiculopathy, cervical region: Secondary | ICD-10-CM

## 2021-04-18 MED ORDER — PREDNISONE 10 MG (48) PO TBPK: ORAL_TABLET | Freq: Every day | ORAL | 0 refills | Status: DC

## 2021-04-18 MED ORDER — MELOXICAM 15 MG PO TABS: ORAL_TABLET | ORAL | 3 refills | Status: DC

## 2021-04-18 NOTE — Progress Notes (Signed)
    Procedures performed today:    None.  Independent interpretation of notes and tests performed by another provider:   Cervical spine x-rays from 2019 reviewed, there is C5 C7 DDD  Brief History, Exam, Impression, and Recommendations:    Radiculitis of left cervical region This is a pleasant 45 year old female, she is had a week of pain in her neck, radiating to the left periscapular region down the left arm in a C7 distribution. No progressive weakness, trauma. I explained the anatomy and pathophysiology of cervical radiculitis in the setting of DDD on x-rays, adding a 12-day taper prednisone as she just had some for a cough, followed by meloxicam, formal physical therapy, updated x-rays. Return to see me in 6 weeks. MRI for interventional planning if no better.    ___________________________________________ Ihor Austin. Benjamin Stain, M.D., ABFM., CAQSM. Primary Care and Sports Medicine [facility]  Adjunct Instructor of Family Medicine  [employer]

## 2021-04-18 NOTE — Assessment & Plan Note (Signed)
This is a pleasant 45 year old female, she is had a week of pain in her neck, radiating to the left periscapular region down the left arm in a C7 distribution. No progressive weakness, trauma. I explained the anatomy and pathophysiology of cervical radiculitis in the setting of DDD on x-rays, adding a 12-day taper prednisone as she just had some for a cough, followed by meloxicam, formal physical therapy, updated x-rays. Return to see me in 6 weeks. MRI for interventional planning if no better.

## 2021-04-28 ENCOUNTER — Encounter: Payer: Self-pay | Admitting: Physical Therapy

## 2021-04-28 ENCOUNTER — Other Ambulatory Visit: Payer: Self-pay

## 2021-04-28 ENCOUNTER — Ambulatory Visit (INDEPENDENT_AMBULATORY_CARE_PROVIDER_SITE_OTHER): Payer: No Typology Code available for payment source | Admitting: Physical Therapy

## 2021-04-28 DIAGNOSIS — R293 Abnormal posture: Secondary | ICD-10-CM

## 2021-04-28 DIAGNOSIS — M5412 Radiculopathy, cervical region: Secondary | ICD-10-CM | POA: Diagnosis not present

## 2021-04-28 NOTE — Patient Instructions (Signed)
Access Code: [email or web address] URL: [email or web address] Date: 04/28/2021 Prepared by: Reggy Eye  Exercises Standing Cervical Retraction - 1 x daily - 7 x weekly - 1 sets - 10 reps - 3-5 seconds hold Doorway Pec Stretch at 90 Degrees Abduction - 1 x daily - 7 x weekly - 3 sets - 1 reps - 20-30 seconds hold Shoulder External Rotation and Scapular Retraction with Resistance - 1 x daily - 7 x weekly - 2 sets - 10 reps Standing Shoulder Row with Anchored Resistance - 1 x daily - 7 x weekly - 2 sets - 10 reps Shoulder extension with resistance - Neutral - 1 x daily - 7 x weekly - 2 sets - 10 reps

## 2021-04-28 NOTE — Therapy (Addendum)
[facility] [place], [place], [address] Phone: [phone]   Fax:  [phone]  Physical Therapy Evaluation and Discharge  Patient Details  Name: Meredith Barnes MRN: [id number] Date of Birth: October 03, 1975 Referring Provider (PT): Thekkekandam   Encounter Date: 04/28/2021   PT End of Session - 04/28/21 0841     Visit Number 1    Number of Visits 6    Date for PT Re-Evaluation 06/09/21    PT Start Time 0800    PT Stop Time 0840    PT Time Calculation (min) 40 min    Activity Tolerance Patient tolerated treatment well    Behavior During Therapy [facility] for tasks assessed/performed             Past Medical History:  Diagnosis Date   Alopecia    Arthritis    Asthma    Cyst, ovary, follicular    Diverticulitis    Ectopic pregnancy    H. pylori infection    Hx of hysterectomy partial for cervicl CA 2003   Migraine headache    Migraines    Sigmoidoscopy exam partial sigmoid colectomy 07-23-08    Past Surgical History:  Procedure Laterality Date   ABDOMINAL HYSTERECTOMY     ECTOPIC PREGNANCY SURGERY     FOOT SURGERY     HERNIA REPAIR     OOPHORECTOMY     partial sigmoid colectomy 07-23-08  07-23-08   TOTAL VAGINAL HYSTERECTOMY  2003   Cervical Cancer    There were no vitals filed for this visit.    Subjective Assessment - 04/28/21 0807     Subjective Pt states the for the past month she has had numbness going down her LT UE. She saw MD who diagnosed with cervical radiculopathy and recommended prednisone and PT. Pt states prednisone has helped "a little" but she still has symptoms. Symptoms are always present, nothing can reduce or change symptoms.    Diagnostic tests Lower cervical spondylosis and facet hypertrophy, most pronounced  at C5-6 and C6-7    Patient Stated Goals reduce symptoms    Currently in Pain? No/denies                [facility] PT Assessment - 04/28/21 0001       Assessment    Medical Diagnosis radiculitis of Left cervical region, C5-C7 DDD    Referring Provider (PT) Thekkekandam    Onset Date/Surgical Date 03/29/21    Hand Dominance Right    Next MD Visit 05/30/21      Precautions   Precautions None      Balance Screen   Has the patient fallen in the past 6 months No      Prior Function   Level of Independence Independent    Vocation Requirements work from home      Observation/Other Assessments   Focus on Therapeutic Outcomes (FOTO)  71      Posture/Postural Control   Posture Comments rounded shoulders, forward head      ROM / Strength   AROM / PROM / Strength AROM;Strength      AROM   AROM Assessment Site Cervical    Cervical Flexion 45    Cervical Extension 15    Cervical - Right Side Bend 45    Cervical - Left Side Bend 45    Cervical - Right Rotation 20    Cervical - Left Rotation 30      Strength   Strength Assessment  Site Hand;Shoulder    Right/Left Shoulder Right;Left    Right Shoulder Flexion 4+/5    Right Shoulder ABduction 4+/5    Left Shoulder Flexion 4+/5    Left Shoulder ABduction 4+/5    Right/Left hand --   grip strength 65# bilat     Palpation   Spinal mobility hypomobile C spine PAs    Palpation comment TTP Lt upper trap, no TTP cervical paraspinals      Special Tests   Other special tests spurling (+) Lt, negative ulnar nerve tension test                        Objective measurements completed on examination: See above findings.       [facility] Adult PT Treatment/Exercise - 04/28/21 0001       Exercises   Exercises Neck      Neck Exercises: Theraband   Shoulder Extension 10 reps    Shoulder Extension Limitations red TB    Rows 10 reps    Rows Limitations red TB    Shoulder External Rotation Limitations bilat ER with red TB x 10      Neck Exercises: Supine   Neck Retraction 5 reps;5 secs      Neck Exercises: Stretches   Other Neck Stretches doorway stretch at 90 2 x 30 sec                      PT Education - 04/28/21 0834     Education Details HEP, PT POC and goals    Person(s) Educated Patient    Methods Explanation;Demonstration;Handout    Comprehension Verbalized understanding;Returned demonstration                 PT Long Term Goals - 04/28/21 0935       PT LONG TERM GOAL #1   Title Pt will be independent in HEP    Time 6    Period Weeks    Status New    Target Date 06/09/21      PT LONG TERM GOAL #2   Title Pt will improve FOTO to >= 77 to demo improved functional mobility    Time 6    Period Weeks    Status New    Target Date 06/09/21      PT LONG TERM GOAL #3   Title Pt will report symptoms down Lt UE < 25% of the time during the day    Time 6    Period Weeks    Status New    Target Date 06/09/21                    Plan - 04/28/21 0841     Clinical Impression Statement Pt is a 45 y/o female referred for cervical radiculopathy and DDD. Pt presents with decreased cervical ROM, impaired posture, increased numbness/tingling in Lt UE and decreased functional activity tolerance. 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    PT Home Exercise Plan [id number]    Consulted and Agree with Plan of Care Patient             Patient will benefit from skilled  therapeutic intervention in order to improve the following deficits and impairments:  Postural dysfunction, Decreased activity tolerance, Decreased range of motion, Impaired sensation, Increased muscle spasms  Visit Diagnosis: Radiculopathy, cervical region - Plan: PT plan of care cert/re-cert  Abnormal posture - Plan: PT plan of care cert/re-cert     Problem List Patient Active Problem List   Diagnosis Date Noted   Radiculitis of left cervical region 04/18/2021   Flatulence 02/27/2021   History of partial colectomy 02/27/2021   Primary osteoarthritis of left knee 02/27/2021   Alopecia of scalp 02/01/2020   Tenosynovitis of foot and ankle 12/23/2013   IFG (impaired fasting glucose) 05/27/2012   OVARIAN CYST 02/07/2009   Migraine without aura 05/13/2007   HEMATURIA 09/23/2006   ASTHMA 06/20/2006   ECZEMA 06/20/2006   ANEMIA, IRON DEFICIENCY, UNSPEC. 04/30/2006   FATIGUE/MALAISE 04/30/2006   PHYSICAL THERAPY DISCHARGE SUMMARY  Visits from Start of Care: 1  Current functional level related to goals / functional outcomes: No change, eval only   Remaining deficits: See above   Education / Equipment: HEP   Patient agrees to discharge. Patient goals were not met. Patient is being discharged due to not returning since the last visit. Isabelle Course, PT,DPT11/02/224:19 PM  [place], PT 04/28/2021, 9:38 AM  [facility] [place], [place], [address] Phone: [phone]   Fax:  [phone]  Name: Meredith Barnes MRN: [id number] Date of Birth: [birth year]

## 2021-05-08 ENCOUNTER — Encounter: Payer: No Typology Code available for payment source | Admitting: Physical Therapy

## 2021-05-15 ENCOUNTER — Encounter: Payer: No Typology Code available for payment source | Admitting: Physical Therapy

## 2021-05-16 ENCOUNTER — Encounter: Payer: Self-pay | Admitting: Family Medicine

## 2021-05-16 DIAGNOSIS — R0683 Snoring: Secondary | ICD-10-CM

## 2021-05-16 NOTE — Telephone Encounter (Signed)
Would need ENT ref. Happy to send if she would like

## 2021-05-22 ENCOUNTER — Encounter: Payer: No Typology Code available for payment source | Admitting: Physical Therapy

## 2021-05-30 ENCOUNTER — Other Ambulatory Visit: Payer: Self-pay

## 2021-05-30 ENCOUNTER — Ambulatory Visit (INDEPENDENT_AMBULATORY_CARE_PROVIDER_SITE_OTHER): Payer: No Typology Code available for payment source | Admitting: Sports Medicine

## 2021-05-30 DIAGNOSIS — M5412 Radiculopathy, cervical region: Secondary | ICD-10-CM | POA: Diagnosis not present

## 2021-05-30 DIAGNOSIS — M7541 Impingement syndrome of right shoulder: Secondary | ICD-10-CM | POA: Diagnosis not present

## 2021-05-30 NOTE — Assessment & Plan Note (Signed)
This pleasant 45 year old female returns, her cervical radiculitis has resolved with conservative treatment.

## 2021-05-30 NOTE — Progress Notes (Signed)
    Procedures performed today:    None.  Independent interpretation of notes and tests performed by another provider:   None.  Brief History, Exam, Impression, and Recommendations:    Radiculitis of left cervical region This pleasant 45 year old female returns, her cervical radiculitis has resolved with conservative treatment.  Impingement syndrome, shoulder, right After resolution of her left-sided C7 radicular symptoms she has developed discomfort in her right shoulder, she does have some impingement signs on exam. Symptoms are very mild, we will add some rotator cuff conditioning exercises to be done daily for the next 6 weeks and we can send in as needed follow-up in 6 weeks.    ___________________________________________ Ihor Austin. Benjamin Stain, M.D., ABFM., CAQSM. Primary Care and Sports Medicine [facility]  Adjunct Instructor of Family Medicine  [employer]

## 2021-05-30 NOTE — Assessment & Plan Note (Signed)
After resolution of her left-sided C7 radicular symptoms she has developed discomfort in her right shoulder, she does have some impingement signs on exam. Symptoms are very mild, we will add some rotator cuff conditioning exercises to be done daily for the next 6 weeks and we can send in as needed follow-up in 6 weeks.

## 2021-06-07 ENCOUNTER — Encounter: Payer: Self-pay | Admitting: Family Medicine

## 2021-06-07 NOTE — Telephone Encounter (Signed)
Patient scheduled. Patient is aware.

## 2021-06-08 ENCOUNTER — Other Ambulatory Visit: Payer: Self-pay

## 2021-06-08 ENCOUNTER — Encounter: Payer: Self-pay | Admitting: Family Medicine

## 2021-06-08 ENCOUNTER — Ambulatory Visit (INDEPENDENT_AMBULATORY_CARE_PROVIDER_SITE_OTHER): Payer: No Typology Code available for payment source | Admitting: Family Medicine

## 2021-06-08 DIAGNOSIS — N898 Other specified noninflammatory disorders of vagina: Secondary | ICD-10-CM | POA: Diagnosis not present

## 2021-06-08 NOTE — Progress Notes (Signed)
Pt here for self collection for wet prep.  Tiajuana Amass, CMA

## 2021-06-08 NOTE — Progress Notes (Signed)
Vagintis - Patient complains of vaginal irritation and itching.  She does have a history of herpes simplex and has been using her Valtrex.  She initially called asking if we could call in Flagyl.  She is here today to do a self wet prep.  We will call with results once available.  Nani Gasser, MD

## 2021-06-09 LAB — WET PREP FOR TRICH, YEAST, CLUE
MICRO NUMBER:: 12651486
Specimen Quality: ADEQUATE

## 2021-06-09 MED ORDER — METRONIDAZOLE 500 MG PO TABS
500.0000 mg | ORAL_TABLET | Freq: Two times a day (BID) | ORAL | 0 refills | Status: DC
Start: 2021-06-09 — End: 2021-07-13

## 2021-06-09 NOTE — Addendum Note (Signed)
Addended by: Nani Gasser D on: 06/09/2021 10:39 AM   Modules accepted: Orders

## 2021-06-09 NOTE — Progress Notes (Signed)
Hi Meredith Barnes,  You have a sexually transmitted infection called trichomonas.  No yeast or bacteria.  I am going to send over prescription to take care of this.  I would encourage you to notify your partner and have them treated as well.

## 2021-07-11 ENCOUNTER — Ambulatory Visit: Payer: No Typology Code available for payment source | Admitting: Family Medicine

## 2021-07-13 ENCOUNTER — Encounter: Payer: Self-pay | Admitting: Family Medicine

## 2021-07-13 ENCOUNTER — Telehealth (INDEPENDENT_AMBULATORY_CARE_PROVIDER_SITE_OTHER): Payer: No Typology Code available for payment source | Admitting: Family Medicine

## 2021-07-13 DIAGNOSIS — J0100 Acute maxillary sinusitis, unspecified: Secondary | ICD-10-CM

## 2021-07-13 MED ORDER — AMOXICILLIN 875 MG PO TABS: 875.0000 mg | ORAL_TABLET | Freq: Two times a day (BID) | ORAL | 0 refills | Status: AC

## 2021-07-13 MED ORDER — HYDROCODONE BIT-HOMATROP MBR 5-1.5 MG/5ML PO SOLN: 5.0000 mL | Freq: Three times a day (TID) | ORAL | 0 refills | Status: DC | PRN

## 2021-07-13 NOTE — Progress Notes (Signed)
Pt reports that her sxs began on Sunday. She was blowing out and coughing up yellow mucus this is now clear. She has been using Flonase,and OTC sinus medication. She reports that she is feeling somewhat better today. Denies any f/s/c.  She was having some facial pain and headache this has since resolved.  She hasn't taken a Covid test.

## 2021-07-13 NOTE — Addendum Note (Signed)
Addended by: Nani Gasser D on: 07/13/2021 12:18 PM   Modules accepted: Orders

## 2021-07-13 NOTE — Progress Notes (Addendum)
Virtual Visit via Video Note  I connected with Meredith Barnes on 07/13/21 at 10:10 AM EST by a video enabled telemedicine application and verified that I am speaking with the correct person using two identifiers.   I discussed the limitations of evaluation and management by telemedicine and the availability of in person appointments. The patient expressed understanding and agreed to proceed.  Patient location: at home Provider location: in office  Subjective:    CC:     Chief Complaint  Patient presents with   Cough    HPI: Pt reports that her sxs began on Sunday ( 5 days ago) . She was blowing out and coughing up yellow mucus this is now clear. She has been using Flonase,and OTC sinus medication. She reports that she is feeling somewhat better today. Denies any f/s/c.  No sore throat.  No ear pain.  No GI symptoms.  She has had lots of bilateral maxillary and nasal bridge pain and pressure.  It is pretty symmetric.yellow mucous.  She found her fluticasone nasal spray yesterday and started that and does feel like it is helping some.   She was having some facial pain and headache this has since resolved.  She hasn't taken a Covid test.   He is also noticed a little bit of swelling underneath her chin but under the jawline.    Past medical history, Surgical history, Family history not pertinant except as noted below, Social history, Allergies, and medications have been entered into the medical record, reviewed, and corrections made.    Objective:    General: Speaking clearly in complete sentences without any shortness of breath.  Alert and oriented x3.  Normal judgment. No apparent acute distress.    Impression and Recommendations:    Problem List Items Addressed This Visit   None Visit Diagnoses     Acute non-recurrent maxillary sinusitis    -  Primary   Relevant Medications   amoxicillin (AMOXIL) 875 MG tablet   HYDROcodone bit-homatropine (HYCODAN) 5-1.5 MG/5ML syrup       Acute sinusitis-we did discuss that she is feeling a little better today so I think it would be reasonable to give it at least another day or 2 to see if she continues to improve.  But if not improving or suddenly feels worse then okay to fill the prescription for the antibiotic and over getting ready to head into the holiday weekend and we will actually be closed on Monday.  She just on the fluticasone yesterday and I do think that that would be helpful so encouraged her to continue with that.  Call if not better after the holidays.  Make sure hydrating well and encouraged to humidify the air a little bit in her home.  As far as the swelling underneath her chin I just encouraged her to make an appointment after the holidays if it still present.  It started a little bit before she actually got sick so I am not sure that it is just a lymph node it could be a swollen parotid gland as well.  She really has not had a lot of pain or tenderness with it.  Monitor for new or changing symptoms or redness or increased warmth.  No orders of the defined types were placed in this encounter.   Meds ordered this encounter  Medications   amoxicillin (AMOXIL) 875 MG tablet    Sig: Take 1 tablet (875 mg total) by mouth 2 (two) times daily for  10 days.    Dispense:  14 tablet    Refill:  0   HYDROcodone bit-homatropine (HYCODAN) 5-1.5 MG/5ML syrup    Sig: Take 5 mLs by mouth every 8 (eight) hours as needed for cough.    Dispense:  75 mL    Refill:  0     I discussed the assessment and treatment plan with the patient. The patient was provided an opportunity to ask questions and all were answered. The patient agreed with the plan and demonstrated an understanding of the instructions.   The patient was advised to call back or seek an in-person evaluation if the symptoms worsen or if the condition fails to improve as anticipated.   Nani Gasser, MD

## 2021-09-07 ENCOUNTER — Other Ambulatory Visit: Payer: Self-pay | Admitting: Neurology

## 2021-09-07 DIAGNOSIS — A6004 Herpesviral vulvovaginitis: Secondary | ICD-10-CM

## 2021-09-07 MED ORDER — VALACYCLOVIR HCL 500 MG PO TABS: 500.0000 mg | ORAL_TABLET | Freq: Two times a day (BID) | ORAL | 3 refills | Status: DC | PRN

## 2021-09-21 ENCOUNTER — Other Ambulatory Visit: Payer: Self-pay | Admitting: Family Medicine

## 2021-09-21 DIAGNOSIS — A6004 Herpesviral vulvovaginitis: Secondary | ICD-10-CM

## 2021-12-19 ENCOUNTER — Encounter: Payer: Self-pay | Admitting: Family Medicine

## 2021-12-19 ENCOUNTER — Ambulatory Visit (INDEPENDENT_AMBULATORY_CARE_PROVIDER_SITE_OTHER): Payer: No Typology Code available for payment source | Admitting: Family Medicine

## 2021-12-19 VITALS — BP 126/87 | HR 75 | Resp 18 | Ht 65.0 in | Wt 267.0 lb

## 2021-12-19 DIAGNOSIS — N760 Acute vaginitis: Secondary | ICD-10-CM | POA: Diagnosis not present

## 2021-12-19 DIAGNOSIS — Z0000 Encounter for general adult medical examination without abnormal findings: Secondary | ICD-10-CM

## 2021-12-19 DIAGNOSIS — A6004 Herpesviral vulvovaginitis: Secondary | ICD-10-CM | POA: Diagnosis not present

## 2021-12-19 MED ORDER — VALACYCLOVIR HCL 500 MG PO TABS: 500.0000 mg | ORAL_TABLET | Freq: Two times a day (BID) | ORAL | 5 refills | Status: DC | PRN

## 2021-12-19 MED ORDER — METRONIDAZOLE 500 MG PO TABS: 500.0000 mg | ORAL_TABLET | Freq: Two times a day (BID) | ORAL | 0 refills | Status: DC

## 2021-12-19 NOTE — Progress Notes (Signed)
Hi Meredith Barnes,  The vaginal swab came back positive for overgrowth of bacteria.  Some to send over prescription to clear that up.  Orders Placed This Encounter     valACYclovir (VALTREX) 500 MG tablet         Sig: Take 1 tablet (500 mg total) by mouth 2 (two) times daily as needed. For 3 to 5 days at the first sign of outbreak         Dispense:  30 tablet         Refill:  5     metroNIDAZOLE (FLAGYL) 500 MG tablet         Sig: Take 1 tablet (500 mg total) by mouth 2 (two) times daily.         Dispense:  14 tablet         Refill:  0

## 2021-12-19 NOTE — Addendum Note (Signed)
Addended by: Nani Gasser D on: 12/19/2021 01:37 PM   Modules accepted: Orders

## 2021-12-19 NOTE — Progress Notes (Signed)
Complete physical exam  Patient: Meredith Barnes   DOB: 03-Dec-1975   46 y.o. Female  MRN: [id number]  Subjective:    Chief Complaint  Patient presents with   Annual Exam    Fasting    Lump In Abdomen    Patient states she has a hard lump in mid abdomen.     Meredith Barnes is a 46 y.o. female who presents today for a complete physical exam. She reports consuming a general diet. The patient does not participate in regular exercise at present. She generally feels well.  She does have additional problems to discuss today.    Most recent fall risk assessment:    12/19/2021    8:26 AM  Fall Risk   Falls in the past year? 0  Number falls in past yr: 0  Injury with Fall? 0  Risk for fall due to : No Fall Risks  Follow up Falls prevention discussed;Falls evaluation completed     Most recent depression screenings:    12/19/2021    8:26 AM 03/21/2021   10:00 AM  PHQ 2/9 Scores  PHQ - 2 Score 0 0        Patient Care Team: Meredith Games, MD as PCP - General (Family Medicine) Meredith Barnes, Meredith Bos, PA-C as Physician Assistant (Dermatology)   Outpatient Medications Prior to Visit  Medication Sig   [DISCONTINUED] Cholecalciferol (VITAMIN D3) 1.25 MG (50000 UT) CAPS Take by mouth.   [DISCONTINUED] valACYclovir (VALTREX) 500 MG tablet Take 1 tablet (500 mg total) by mouth 2 (two) times daily as needed. For 3 to 5 days at the first sign of outbreak   [DISCONTINUED] HYDROcodone bit-homatropine (HYCODAN) 5-1.5 MG/5ML syrup Take 5 mLs by mouth every 8 (eight) hours as needed for cough.   [DISCONTINUED] meloxicam (MOBIC) 15 MG tablet One tab PO qAM with a meal for 2 weeks, then daily prn pain.   No facility-administered medications prior to visit.    ROS        Objective:     BP (!) 134/92   Pulse 75   Resp 18   Ht 5\' 5"  (1.651 m)   Wt 267 lb (121.1 kg)   SpO2 96%   BMI 44.43 kg/m     Physical Exam Vitals and nursing note reviewed.   Constitutional:      Appearance: She is well-developed.  HENT:     Head: Normocephalic and atraumatic.     Right Ear: External ear normal.     Left Ear: External ear normal.     Nose: Nose normal.  Eyes:     Conjunctiva/sclera: Conjunctivae normal.     Pupils: Pupils are equal, round, and reactive to light.  Neck:     Thyroid: No thyromegaly.  Cardiovascular:     Rate and Rhythm: Normal rate and regular rhythm.     Heart sounds: Normal heart sounds.  Pulmonary:     Effort: Pulmonary effort is normal.     Breath sounds: Normal breath sounds. No wheezing.  Musculoskeletal:     Cervical back: Neck supple.  Lymphadenopathy:     Cervical: No cervical adenopathy.  Skin:    General: Skin is warm and dry.  Neurological:     Mental Status: She is alert and oriented to person, place, and time.  Psychiatric:        Behavior: Behavior normal.     Results for orders placed or performed in visit on 12/19/21  WET PREP FOR TRICH,  YEAST, CLUE  Result Value Ref Range   MICRO NUMBER: [id number]    Specimen Quality Adequate    SOURCE: NOT GIVEN    Status FINAL    RESULT (A)     No Trichomonas vaginalis seen. No yeast seen Clue cells seen Epithelial Cells Present        Assessment & Plan:    Routine Health Maintenance and Physical Exam  Immunization History  Administered Date(s) Administered   Influenza Whole 04/22/2006   PFIZER(Purple Top)SARS-COV-2 Vaccination 10/31/2019, 11/27/2019, 08/01/2020   Tdap 07/03/2019    Health Maintenance  Topic Date Due   COVID-19 Vaccine (4 - Booster for Pfizer series) 07/29/2022 (Originally 09/26/2020)   INFLUENZA VACCINE  02/20/2022   TETANUS/TDAP  07/02/2029   Hepatitis C Screening  Completed   HIV Screening  Completed   Pneumococcal Vaccine 74-56 Years old  Aged Out   HPV VACCINES  Aged Out    Discussed health benefits of physical activity, and encouraged her to engage in regular exercise appropriate for her age and condition.  Problem  List Items Addressed This Visit   None Visit Diagnoses     Wellness examination    -  Primary   Relevant Medications   valACYclovir (VALTREX) 500 MG tablet   Other Relevant Orders   COMPLETE METABOLIC PANEL WITH GFR   Lipid Panel w/reflex Direct LDL   WET PREP FOR TRICH, YEAST, CLUE (Completed)   CBC   TSH   Hepatitis C Antibody   HIV antibody (with reflex)   RPR   B12   Fe+TIBC+Fer   C. trachomatis/N. gonorrhoeae RNA   Herpes simplex vulvovaginitis       Relevant Medications   valACYclovir (VALTREX) 500 MG tablet   Acute vaginitis       Relevant Orders   WET PREP FOR TRICH, YEAST, CLUE (Completed)      No follow-ups on file.   Keep up a regular exercise program and make sure you are eating a healthy diet Try to eat 4 servings of dairy a day, or if you are lactose intolerant take a calcium with vitamin D daily.  Your vaccines are up to date.    Nani Gasser, MD

## 2021-12-20 ENCOUNTER — Encounter: Payer: Self-pay | Admitting: Family Medicine

## 2021-12-20 LAB — LIPID PANEL W/REFLEX DIRECT LDL
Cholesterol: 202 mg/dL — ABNORMAL HIGH (ref <200)
HDL: 51 mg/dL (ref >=50)
LDL Cholesterol (Calc): 128 mg/dL (calc) — ABNORMAL HIGH
Non-HDL Cholesterol (Calc): 151 mg/dL (calc) — ABNORMAL HIGH (ref <130)
Total CHOL/HDL Ratio: 4 (calc) (ref <5.0)
Triglycerides: 118 mg/dL (ref <150)

## 2021-12-20 LAB — IRON,TIBC AND FERRITIN PANEL
%SAT: 18 % (calc) (ref 16–45)
Ferritin: 65 ng/mL (ref 16–232)
Iron: 63 ug/dL (ref 40–190)
TIBC: 343 mcg/dL (calc) (ref 250–450)

## 2021-12-20 LAB — COMPLETE METABOLIC PANEL WITH GFR
AG Ratio: 1.3 (calc) (ref 1.0–2.5)
ALT: 14 U/L (ref 6–29)
AST: 11 U/L (ref 10–30)
Albumin: 4.2 g/dL (ref 3.6–5.1)
Alkaline phosphatase (APISO): 65 U/L (ref 31–125)
BUN: 15 mg/dL (ref 7–25)
CO2: 28 mmol/L (ref 20–32)
Calcium: 9.9 mg/dL (ref 8.6–10.2)
Chloride: 106 mmol/L (ref 98–110)
Creat: 0.79 mg/dL (ref 0.50–0.99)
Globulin: 3.2 g/dL (calc) (ref 1.9–3.7)
Glucose, Bld: 104 mg/dL — ABNORMAL HIGH (ref 65–99)
Potassium: 4.3 mmol/L (ref 3.5–5.3)
Sodium: 144 mmol/L (ref 135–146)
Total Bilirubin: 0.3 mg/dL (ref 0.2–1.2)
Total Protein: 7.4 g/dL (ref 6.1–8.1)
eGFR: 95 mL/min/{1.73_m2} (ref >=60)

## 2021-12-20 LAB — VITAMIN B12: Vitamin B-12: 335 pg/mL (ref 200–1100)

## 2021-12-20 LAB — WET PREP FOR TRICH, YEAST, CLUE
MICRO NUMBER:: 13457821
Specimen Quality: ADEQUATE

## 2021-12-20 LAB — CBC
HCT: 38.3 % (ref 35.0–45.0)
Hemoglobin: 12.1 g/dL (ref 11.7–15.5)
MCH: 24.2 pg — ABNORMAL LOW (ref 27.0–33.0)
MCHC: 31.6 g/dL — ABNORMAL LOW (ref 32.0–36.0)
MCV: 76.6 fL — ABNORMAL LOW (ref 80.0–100.0)
MPV: 11.1 fL (ref 7.5–12.5)
Platelets: 299 10*3/uL (ref 140–400)
RBC: 5 10*6/uL (ref 3.80–5.10)
RDW: 16.5 % — ABNORMAL HIGH (ref 11.0–15.0)
WBC: 6.6 10*3/uL (ref 3.8–10.8)

## 2021-12-20 LAB — HEPATITIS C ANTIBODY
Hepatitis C Ab: NONREACTIVE
SIGNAL TO CUT-OFF: 0.07 (ref <1.00)

## 2021-12-20 LAB — RPR: RPR Ser Ql: NONREACTIVE

## 2021-12-20 LAB — HIV ANTIBODY (ROUTINE TESTING W REFLEX): HIV 1&2 Ab, 4th Generation: NONREACTIVE

## 2021-12-20 LAB — TSH: TSH: 2.03 mIU/L

## 2021-12-20 LAB — C. TRACHOMATIS/N. GONORRHOEAE RNA
C. trachomatis RNA, TMA: NOT DETECTED
N. gonorrhoeae RNA, TMA: NOT DETECTED

## 2021-12-20 NOTE — Progress Notes (Signed)
HI Meredith Barnes,  Your LDL cholesterol is up just a little bit.  Just continue to work on healthy diet and regular exercise to bring that down.  Your metabolic panel looks good.  Blood count is stable.  Iron levels are okay.  Just continue to eat iron rich foods.  Thyroid looks great.  Vitamin B12 is normal.  Negative for hepatitis C.  Negative for HIV.  There is still a couple labs pending.

## 2021-12-25 NOTE — Progress Notes (Signed)
Negative for gonorrhea and chlamydia.  Negative for syphilis.  Ferritin looks good.

## 2022-01-24 ENCOUNTER — Encounter: Payer: Self-pay | Admitting: Family Medicine

## 2022-01-24 ENCOUNTER — Telehealth (INDEPENDENT_AMBULATORY_CARE_PROVIDER_SITE_OTHER): Payer: No Typology Code available for payment source | Admitting: Family Medicine

## 2022-01-24 VITALS — Ht 65.0 in | Wt 267.0 lb

## 2022-01-24 DIAGNOSIS — J209 Acute bronchitis, unspecified: Secondary | ICD-10-CM | POA: Diagnosis not present

## 2022-01-24 MED ORDER — PREDNISONE 20 MG PO TABS: 40.0000 mg | ORAL_TABLET | Freq: Every day | ORAL | 0 refills | Status: DC

## 2022-01-24 MED ORDER — HYDROCODONE BIT-HOMATROP MBR 5-1.5 MG/5ML PO SOLN
5.0000 mL | Freq: Three times a day (TID) | ORAL | 0 refills | Status: DC | PRN
Start: 2022-01-24 — End: 2022-08-14

## 2022-01-24 NOTE — Progress Notes (Signed)
    Virtual Visit via Video Note  I connected with Meredith Barnes on 01/24/22 at  8:10 AM EDT by a video enabled telemedicine application and verified that I am speaking with the correct person using two identifiers.   I discussed the limitations of evaluation and management by telemedicine and the availability of in person appointments. The patient expressed understanding and agreed to proceed.  Patient location: at home Provider location: in office  Subjective:    CC:   Chief Complaint  Patient presents with   Cough    Productive cough, hoarse voice, facial pressure, 5 days. Covid test negative yesterday.     HPI: Productive cough and lost voice x 3 day. No fever. Yesterday felt some SOB.  NO GI sxs.  No nasal congestion. Sometimes yellow mucous from chest. Used the codeine cough syrup. Taking Aleve congestion tab no rash. Sleeping a lot, dec appetite.    Past medical history, Surgical history, Family history not pertinant except as noted below, Social history, Allergies, and medications have been entered into the medical record, reviewed, and corrections made.    Objective:    General: Speaking clearly in complete sentences without any shortness of breath.  Alert and oriented x3.  Normal judgment. No apparent acute distress.    Impression and Recommendations:    Problem List Items Addressed This Visit   None Visit Diagnoses     Acute bronchitis, unspecified organism    -  Primary      Acute bronchitis-most likely viral.  Symptoms x5 days at this point no red flag symptoms for pneumonia though we did discuss things to look out for and to call sooner if she develops those.  Otherwise if she is not feeling some better after 5 days then please let us know.  Did send over prescription for cough syrup for nighttime as well as prednisone.  No orders of the defined types were placed in this encounter.   Meds ordered this encounter  Medications   HYDROcodone  bit-homatropine (HYCODAN) 5-1.5 MG/5ML syrup    Sig: Take 5 mLs by mouth every 8 (eight) hours as needed for cough.    Dispense:  75 mL    Refill:  0   predniSONE (DELTASONE) 20 MG tablet    Sig: Take 2 tablets (40 mg total) by mouth daily with breakfast.    Dispense:  10 tablet    Refill:  0     I discussed the assessment and treatment plan with the patient. The patient was provided an opportunity to ask questions and all were answered. The patient agreed with the plan and demonstrated an understanding of the instructions.   The patient was advised to call back or seek an in-person evaluation if the symptoms worsen or if the condition fails to improve as anticipated.   Nani Gasser, MD

## 2022-02-02 ENCOUNTER — Telehealth: Payer: Self-pay | Admitting: *Deleted

## 2022-02-02 ENCOUNTER — Encounter: Payer: Self-pay | Admitting: Family Medicine

## 2022-02-02 MED ORDER — DOXYCYCLINE HYCLATE 100 MG PO CAPS: 100.0000 mg | ORAL_CAPSULE | Freq: Two times a day (BID) | ORAL | 0 refills | Status: DC

## 2022-02-02 NOTE — Telephone Encounter (Signed)
Called pt and advised that Dr. Linford Arnold is sending over an ABX for her Doxycycline 100 mg  BID x 7 days.   Pt advised that if she is not feeling any better that she will need to schedule an appt. She voiced understanding and agreed.

## 2022-02-14 ENCOUNTER — Telehealth (INDEPENDENT_AMBULATORY_CARE_PROVIDER_SITE_OTHER): Payer: No Typology Code available for payment source | Admitting: Family Medicine

## 2022-02-14 ENCOUNTER — Ambulatory Visit (INDEPENDENT_AMBULATORY_CARE_PROVIDER_SITE_OTHER): Payer: No Typology Code available for payment source

## 2022-02-14 DIAGNOSIS — J40 Bronchitis, not specified as acute or chronic: Secondary | ICD-10-CM | POA: Diagnosis not present

## 2022-02-14 DIAGNOSIS — R051 Acute cough: Secondary | ICD-10-CM | POA: Diagnosis not present

## 2022-02-14 MED ORDER — BENZONATATE 100 MG PO CAPS: 100.0000 mg | ORAL_CAPSULE | Freq: Three times a day (TID) | ORAL | 0 refills | Status: DC | PRN

## 2022-02-14 MED ORDER — ALBUTEROL SULFATE HFA 108 (90 BASE) MCG/ACT IN AERS: 2.0000 | INHALATION_SPRAY | Freq: Four times a day (QID) | RESPIRATORY_TRACT | 0 refills | Status: DC | PRN

## 2022-02-14 MED ORDER — AZITHROMYCIN 250 MG PO TABS: ORAL_TABLET | ORAL | 0 refills | Status: AC

## 2022-02-14 NOTE — Addendum Note (Signed)
Addended by: Charlton Amor on: 02/14/2022 11:59 AM   Modules accepted: Orders

## 2022-02-14 NOTE — Progress Notes (Addendum)
Acute Office Visit  Subjective:     Patient ID: Channel Papandrea, female    DOB: Sep 01, 1975, 46 y.o.   MRN: [id number]  Chief Complaint  Patient presents with   Cough   Meredith Barnes was connected by a video enabled telemedicine application and verified that I am speaking with the correct person using two identifiers.   I discussed the limitations of evaluation and management by telemedicine and the availability of in person appointments. The patient expressed understanding and agreed to proceed.   Patient location: at home Provider location: in office  Pt presents with cough for two weeks and was diagnosed originally with bronchitis. She was prescribed prednisone and codeine cough syrup. She was started on doxycycline and has still had recurring cough. She says the humidity or heat has been triggering her cough. When she lays down on the right side the coughing starts again. Nothing to her knowledge helps her cough. She says the cough wakes her up at night. She has had episodes of post-tussive emesis. 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 Denies runny nose, watery eyes.      Review of Systems  Constitutional:  Negative for chills and fever.  Respiratory:  Positive for cough. Negative for shortness of breath.   Cardiovascular:  Negative for chest pain.  Neurological:  Negative for headaches.        Objective:    There were no vitals taken for this visit.  Physical Exam Vitals reviewed.  Constitutional:      Appearance: She is well-developed.  HENT:     Head: Normocephalic and atraumatic.  Eyes:     Conjunctiva/sclera: Conjunctivae normal.  Pulmonary:     Effort: Pulmonary effort is normal.     Comments: Coughing on exam Skin:    General: Skin is dry.     Coloration: Skin is not pale.  Neurological:     Mental Status: She is alert and oriented to person, place, and time.  Psychiatric:        Behavior: Behavior normal.      No results found for any visits on 02/14/22.      Assessment & Plan:   Problem List Items Addressed This Visit       Respiratory   Bronchitis - Primary    - pt aware that she was not able to have lung exam done since this was a telehealth visit - have ordered cxr due to persistent coughing and some wheezing per her report - have ordered albuterol for wheezing  - given tesslon perles for cough  - pt did not improve with doxycyline. Will go ahead and give azithromycin prescription       Relevant Medications   albuterol (VENTOLIN HFA) 108 (90 Base) MCG/ACT inhaler   azithromycin (ZITHROMAX) 250 MG tablet   Other Relevant Orders   DG Chest 2 View     Other   Acute cough   Relevant Medications   albuterol (VENTOLIN HFA) 108 (90 Base) MCG/ACT inhaler   benzonatate (TESSALON) 100 MG capsule    Meds ordered this encounter  Medications   albuterol (VENTOLIN HFA) 108 (90 Base) MCG/ACT inhaler    Sig: Inhale 2 puffs into the lungs every 6 (six) hours as needed for wheezing or shortness of breath.    Dispense:  8 g    Refill:  0   benzonatate (TESSALON) 100 MG capsule    Sig: Take 1 capsule (100 mg total) by  mouth 3 (three) times daily as needed for up to 13 days for cough.    Dispense:  40 capsule    Refill:  0   azithromycin (ZITHROMAX) 250 MG tablet    Sig: Take 2 tablets on day 1, then 1 tablet daily on days 2 through 5    Dispense:  6 tablet    Refill:  0    Return in about 2 weeks (around 02/28/2022) for re-evaluation.  Charlton Amor, DO

## 2022-02-14 NOTE — Assessment & Plan Note (Addendum)
-   pt aware that she was not able to have lung exam done since this was a telehealth visit - have ordered cxr due to persistent coughing and some wheezing per her report - have ordered albuterol for wheezing  - given tesslon perles for cough  - pt did not improve with doxycyline. Will go ahead and give azithromycin prescription

## 2022-02-19 ENCOUNTER — Encounter: Payer: Self-pay | Admitting: Family Medicine

## 2022-02-19 DIAGNOSIS — R051 Acute cough: Secondary | ICD-10-CM

## 2022-02-20 MED ORDER — BENZONATATE 100 MG PO CAPS: 100.0000 mg | ORAL_CAPSULE | Freq: Three times a day (TID) | ORAL | 0 refills | Status: AC | PRN

## 2022-03-13 ENCOUNTER — Other Ambulatory Visit: Payer: Self-pay | Admitting: Family Medicine

## 2022-03-13 DIAGNOSIS — J40 Bronchitis, not specified as acute or chronic: Secondary | ICD-10-CM

## 2022-03-13 DIAGNOSIS — R051 Acute cough: Secondary | ICD-10-CM

## 2022-03-20 ENCOUNTER — Encounter (HOSPITAL_BASED_OUTPATIENT_CLINIC_OR_DEPARTMENT_OTHER): Payer: Self-pay | Admitting: Emergency Medicine

## 2022-03-20 ENCOUNTER — Emergency Department (HOSPITAL_BASED_OUTPATIENT_CLINIC_OR_DEPARTMENT_OTHER)
Admission: EM | Admit: 2022-03-20 | Discharge: 2022-03-20 | Disposition: A | Discharge status: Home / Self Care | Payer: No Typology Code available for payment source | Attending: Emergency Medicine | Admitting: Emergency Medicine

## 2022-03-20 DIAGNOSIS — R109 Unspecified abdominal pain: Secondary | ICD-10-CM | POA: Diagnosis present

## 2022-03-20 DIAGNOSIS — K439 Ventral hernia without obstruction or gangrene: Secondary | ICD-10-CM | POA: Diagnosis not present

## 2022-03-20 DIAGNOSIS — M6208 Separation of muscle (nontraumatic), other site: Secondary | ICD-10-CM | POA: Insufficient documentation

## 2022-03-20 MED ORDER — OXYCODONE HCL 5 MG PO TABS: 5.0000 mg | ORAL_TABLET | Freq: Four times a day (QID) | ORAL | 0 refills | Status: AC | PRN

## 2022-03-20 MED ORDER — IBUPROFEN 800 MG PO TABS: 800.0000 mg | ORAL_TABLET | Freq: Three times a day (TID) | ORAL | 0 refills | Status: DC | PRN

## 2022-03-20 NOTE — Discharge Instructions (Addendum)
Return to emergency department immediately for any signs or symptoms of obstruction or incarceration or strangulation of bowel.  This includes nausea, vomiting, sense of passing gas, or absence of bowel movements.  Return if the hernia remains hard and does not soften or relax with lying flat or for pain that does not resolve with lying flat or resting.  Return for any overlying skin color changes including black, blue, or purple discoloration.

## 2022-03-20 NOTE — ED Provider Notes (Signed)
[facility] EMERGENCY DEPARTMENT Provider Note   CSN: [id number] Arrival date & time: 03/20/22  3664     History  Chief Complaint  Patient presents with   Abdominal Pain    Meredith Barnes is a 46 y.o. female.  Patient is a 46 year old female presenting for complaints of abdominal pain.  Patient has a history of rectus abdominis diastasis with midline hernia that was discovered on Thursday by her GI specialist.  Patient admits to a bulging sensation and pain while sitting up.  She states pain completely resolves while resting and laying back.  Denies any skin discoloration.  States she is passing gas at this time and but having normal bowel movements.  Denies any vomiting or difficulty tolerating p.o.  Has follow-up appointment with general surgeon for hernia repair on Tuesday, March 27, 2022.  The history is provided by the patient. No language interpreter was used.  Abdominal Pain Associated symptoms: no chest pain, no chills, no cough, no diarrhea, no dysuria, no fever, no hematuria, no nausea, no shortness of breath, no sore throat and no vomiting        Home Medications Prior to Admission medications   Medication Sig Start Date End Date Taking? Authorizing Provider  ibuprofen (ADVIL) 800 MG tablet Take 1 tablet (800 mg total) by mouth every 8 (eight) hours as needed for moderate pain. 03/20/22  Yes Edwin Dada P, DO  oxyCODONE (ROXICODONE) 5 MG immediate release tablet Take 1 tablet (5 mg total) by mouth every 6 (six) hours as needed for up to 3 days for severe pain. 03/20/22 03/23/22 Yes Edwin Dada P, DO  albuterol (VENTOLIN HFA) 108 (90 Base) MCG/ACT inhaler TAKE 2 PUFFS BY MOUTH EVERY 6 HOURS AS NEEDED FOR WHEEZE OR SHORTNESS OF BREATH 03/13/22   Charlton Amor, DO  doxycycline (VIBRAMYCIN) 100 MG capsule Take 1 capsule (100 mg total) by mouth 2 (two) times daily. Patient not taking: Reported on 02/14/2022 02/02/22   Agapito Games, MD  HYDROcodone  bit-homatropine (HYCODAN) 5-1.5 MG/5ML syrup Take 5 mLs by mouth every 8 (eight) hours as needed for cough. 01/24/22   Agapito Games, MD  valACYclovir (VALTREX) 500 MG tablet Take 1 tablet (500 mg total) by mouth 2 (two) times daily as needed. For 3 to 5 days at the first sign of outbreak 12/19/21   Agapito Games, MD      Allergies    Iodinated contrast media, Iodine, and Tape    Review of Systems   Review of Systems  Constitutional:  Negative for chills and fever.  HENT:  Negative for ear pain and sore throat.   Eyes:  Negative for pain and visual disturbance.  Respiratory:  Negative for cough and shortness of breath.   Cardiovascular:  Negative for chest pain and palpitations.  Gastrointestinal:  Positive for abdominal pain. Negative for diarrhea, nausea and vomiting.  Genitourinary:  Negative for dysuria and hematuria.  Musculoskeletal:  Negative for arthralgias and back pain.  Skin:  Negative for color change and rash.  Neurological:  Negative for seizures and syncope.  All other systems reviewed and are negative.   Physical Exam Updated Vital Signs BP 113/79 (BP Location: Right Arm)   Pulse 75   Temp 98.2 F (36.8 C) (Oral)   Resp 18   Ht 5\' 5"  (1.651 m)   Wt 122 kg   SpO2 100%   BMI 44.76 kg/m  Physical Exam Vitals and nursing note reviewed.  Constitutional:  General: She is not in acute distress.    Appearance: She is well-developed.  HENT:     Head: Normocephalic and atraumatic.  Eyes:     Conjunctiva/sclera: Conjunctivae normal.  Cardiovascular:     Rate and Rhythm: Normal rate and regular rhythm.     Heart sounds: No murmur heard. Pulmonary:     Effort: Pulmonary effort is normal. No respiratory distress.     Breath sounds: Normal breath sounds.  Abdominal:     Palpations: Abdomen is soft.     Tenderness: There is no abdominal tenderness.    Musculoskeletal:        General: No swelling.     Cervical back: Neck supple.  Skin:     General: Skin is warm and dry.     Capillary Refill: Capillary refill takes less than 2 seconds.  Neurological:     Mental Status: She is alert.  Psychiatric:        Mood and Affect: Mood normal.     ED Results / Procedures / Treatments   Labs (all labs ordered are listed, but only abnormal results are displayed) Labs Reviewed - No data to display  EKG None  Radiology No results found.  Procedures Procedures    Medications Ordered in ED Medications - No data to display  ED Course/ Medical Decision Making/ A&P                           Medical Decision Making  80:47 AM 46 year old female presenting for complaints of abdominal pain at abdominal hernia site that was just diagnosed 6 days ago.  Patient is alert and oriented x3, no acute distress, afebrile, stable vital signs.  Physical exam demonstrates rectus abdominis diastasis with midline hernia approximately 5 cm long vertically.  Soft and easily reducible.  No midline skin color changes.  No signs of incarceration or strangulation.  Symptoms of incarceration and strangulation explained to detail and patient with prompt return instructions.  Patient given Motrin 800 mg and recommend to keep her appointment on this upcoming Tuesday with general surgery for repair.  Rise patient is passing gas, having normal bowel movements, tolerating p.o. without difficulty.  No signs or symptoms of obstruction.  Safe for discharge at this time.  Patient in no distress and overall condition improved here in the ED. Detailed discussions were had with the patient regarding current findings, and need for close f/u with PCP or on call doctor. The patient has been instructed to return immediately if the symptoms worsen in any way for re-evaluation. Patient verbalized understanding and is in agreement with current care plan. All questions answered prior to discharge.         Final Clinical Impression(s) / ED Diagnoses Final diagnoses:  Rectus  diastasis  Ventral hernia without obstruction or gangrene    Rx / DC Orders ED Discharge Orders          Ordered    ibuprofen (ADVIL) 800 MG tablet  Every 8 hours PRN        03/20/22 0753    oxyCODONE (ROXICODONE) 5 MG immediate release tablet  Every 6 hours PRN        03/20/22 0753              Franne Forts, DO 03/20/22 [phone]

## 2022-03-20 NOTE — ED Notes (Signed)
Reviewed discharge instructions and recommendations. Pt aware of medications sent to pharmacy. Pt states understanding. Ambulatory upon discharge

## 2022-03-20 NOTE — ED Triage Notes (Signed)
Pt states has hernia was seen at her doctor Thursday, he pushed on hernia and has been painful since. Worse today.

## 2022-03-21 ENCOUNTER — Other Ambulatory Visit: Payer: Self-pay | Admitting: Family Medicine

## 2022-03-21 ENCOUNTER — Telehealth: Payer: Self-pay | Admitting: General Practice

## 2022-03-21 DIAGNOSIS — A6004 Herpesviral vulvovaginitis: Secondary | ICD-10-CM

## 2022-03-21 DIAGNOSIS — Z0000 Encounter for general adult medical examination without abnormal findings: Secondary | ICD-10-CM

## 2022-03-21 NOTE — Telephone Encounter (Signed)
Transition Care Management Follow-up Telephone Call Date of discharge and from where: 03/20/22 from [facility] How have you been since you were released from the hospital? Still has intermittent pain. She is scheduled to see the surgeon for her hernia on Tuesday.  Any questions or concerns? No  Items Reviewed: Did the pt receive and understand the discharge instructions provided? Yes  Medications obtained and verified? No  Other? No  Any new allergies since your discharge? No  Dietary orders reviewed? Yes Do you have support at home? Yes   Home Care and Equipment/Supplies: Were home health services ordered? no  Functional Questionnaire: (I = Independent and D = Dependent) ADLs: I  Bathing/Dressing- I  Meal Prep- I  Eating- I  Maintaining continence- I  Transferring/Ambulation- I  Managing Meds- I  Follow up appointments reviewed:  PCP Hospital f/u appt confirmed? No   Specialist Hospital f/u appt confirmed? Yes  Scheduled to see Dr. Cliffton Asters on 03/27/22. Are transportation arrangements needed? No  If their condition worsens, is the pt aware to call PCP or go to the Emergency Dept.? Yes Was the patient provided with contact information for the PCP's office or ED? Yes Was to pt encouraged to call back with questions or concerns? Yes

## 2022-08-14 ENCOUNTER — Encounter: Payer: Self-pay | Admitting: Family Medicine

## 2022-08-14 ENCOUNTER — Telehealth (INDEPENDENT_AMBULATORY_CARE_PROVIDER_SITE_OTHER): Payer: No Typology Code available for payment source | Admitting: Family Medicine

## 2022-08-14 DIAGNOSIS — R21 Rash and other nonspecific skin eruption: Secondary | ICD-10-CM

## 2022-08-14 DIAGNOSIS — J40 Bronchitis, not specified as acute or chronic: Secondary | ICD-10-CM

## 2022-08-14 DIAGNOSIS — R053 Chronic cough: Secondary | ICD-10-CM | POA: Diagnosis not present

## 2022-08-14 MED ORDER — IPRATROPIUM BROMIDE 0.06 % NA SOLN: 2.0000 | Freq: Two times a day (BID) | NASAL | 1 refills | Status: DC

## 2022-08-14 MED ORDER — CLOTRIMAZOLE-BETAMETHASONE 1-0.05 % EX CREA: 1.0000 | TOPICAL_CREAM | Freq: Every day | CUTANEOUS | 0 refills | Status: DC | PRN

## 2022-08-14 MED ORDER — ALBUTEROL SULFATE HFA 108 (90 BASE) MCG/ACT IN AERS: INHALATION_SPRAY | RESPIRATORY_TRACT | 3 refills | Status: DC

## 2022-08-14 NOTE — Progress Notes (Signed)
Virtual Visit via Video Note  I connected with Meredith Barnes on 08/14/22 at  3:20 PM EST by a video enabled telemedicine application and verified that I am speaking with the correct person using two identifiers.   I discussed the limitations of evaluation and management by telemedicine and the availability of in person appointments. The patient expressed understanding and agreed to proceed.  Patient location: at home Provider location: in office  Subjective:    CC:  No chief complaint on file.   HPI: Cough -x 3 months.  She said it started initially with an upper respiratory infection she feels much better now but has still continued to cough frequently throughout the day and at night.  She is try taking Mucinex, codeine with cough syrup, ibuprofen, seltzer plus.  She says it seems to be worse if she goes outside in the cold or gets around something hot.  Smells will tend to trigger it.  Feels like it is mostly in her upper chest throat area.  No sore throat.  No sinus symptoms or not congestion.  Occasionally a little bit of postnasal drip.  Also status especially in the wintertime she tends to get a dry peeling rash around the nasolabial folds on each side of the nose and she is also had a hyperpigmented rash on her chin and a little bit to the right side.  It is also been dry.   Past medical history, Surgical history, Family history not pertinant except as noted below, Social history, Allergies, and medications have been entered into the medical record, reviewed, and corrections made.    Objective:    General: Speaking clearly in complete sentences without any shortness of breath.  Alert and oriented x3.  Normal judgment. No apparent acute distress.    Impression and Recommendations:    Problem List Items Addressed This Visit       Respiratory   Bronchitis   Relevant Medications   albuterol (VENTOLIN HFA) 108 (90 Base) MCG/ACT inhaler   Other Visit Diagnoses      Chronic cough    -  Primary   Relevant Medications   albuterol (VENTOLIN HFA) 108 (90 Base) MCG/ACT inhaler   Other Relevant Orders   DG Chest 2 View   Rash       Relevant Medications   clotrimazole-betamethasone (LOTRISONE) cream       Suspect likely postnasal drip with a combination of GERD from chronic cough.  Will treat with Nexium twice a day for 14 days as well as Atrovent nasal spray twice a day for 14 days.  If not feeling much better then please let us know.  Will get a chest x-ray.  She says she will likely come in the morning for that.  Rash-most likely seborrheic dermatitis.  Will treat with Lotrisone cream.  If not improving then recommend she make an appointment for further evaluation and possible skin scraping.  Orders Placed This Encounter  Procedures   DG Chest 2 View    Standing Status:   Future    Standing Expiration Date:   08/15/2023    Order Specific Question:   Reason for Exam (SYMPTOM  OR DIAGNOSIS REQUIRED)    Answer:   Cough    Order Specific Question:   Is patient pregnant?    Answer:   No    Order Specific Question:   Preferred imaging location?    Answer:   [facility]    Meds ordered this encounter  Medications   albuterol (VENTOLIN HFA) 108 (90 Base) MCG/ACT inhaler    Sig: TAKE 2 PUFFS BY MOUTH EVERY 6 HOURS AS NEEDED FOR WHEEZE OR SHORTNESS OF BREATH    Dispense:  8.5 each    Refill:  3   ipratropium (ATROVENT) 0.06 % nasal spray    Sig: Place 2 sprays into both nostrils in the morning and at bedtime.    Dispense:  15 mL    Refill:  1   clotrimazole-betamethasone (LOTRISONE) cream    Sig: Apply 1 Application topically daily as needed.    Dispense:  30 g    Refill:  0     I discussed the assessment and treatment plan with the patient. The patient was provided an opportunity to ask questions and all were answered. The patient agreed with the plan and demonstrated an understanding of the instructions.   The patient was  advised to call back or seek an in-person evaluation if the symptoms worsen or if the condition fails to improve as anticipated.   Beatrice Lecher, MD

## 2022-08-14 NOTE — Progress Notes (Signed)
Pt stated she had a cold in November. Whenever she gets hot she begins to cough and she sometimes will hear a wheeze.   She has a dry cough sometimes she does produce some phlegm   Tried alka seltzer cold, mucinex, cough drops, hot tea, and her albuterol inhaler

## 2022-08-15 ENCOUNTER — Ambulatory Visit: Payer: No Typology Code available for payment source

## 2022-08-15 DIAGNOSIS — R053 Chronic cough: Secondary | ICD-10-CM

## 2022-08-17 NOTE — Progress Notes (Signed)
HI Chaia, the great news is your chest xray looks great!!! Hopefully our plan will show some improvement over the next week or two.

## 2022-09-07 ENCOUNTER — Other Ambulatory Visit: Payer: Self-pay | Admitting: Family Medicine

## 2022-09-30 ENCOUNTER — Other Ambulatory Visit: Payer: Self-pay | Admitting: Family Medicine

## 2022-12-12 ENCOUNTER — Ambulatory Visit (INDEPENDENT_AMBULATORY_CARE_PROVIDER_SITE_OTHER): Payer: No Typology Code available for payment source | Admitting: Physician Assistant

## 2022-12-12 ENCOUNTER — Encounter: Payer: Self-pay | Admitting: Physician Assistant

## 2022-12-12 VITALS — BP 142/78 | HR 71 | Ht 65.0 in | Wt 277.0 lb

## 2022-12-12 DIAGNOSIS — L219 Seborrheic dermatitis, unspecified: Secondary | ICD-10-CM

## 2022-12-12 DIAGNOSIS — N898 Other specified noninflammatory disorders of vagina: Secondary | ICD-10-CM

## 2022-12-12 DIAGNOSIS — L243 Irritant contact dermatitis due to cosmetics: Secondary | ICD-10-CM | POA: Diagnosis not present

## 2022-12-12 LAB — WET PREP FOR TRICH, YEAST, CLUE
MICRO NUMBER:: 14989992
Specimen Quality: ADEQUATE

## 2022-12-12 MED ORDER — METRONIDAZOLE 500 MG PO TABS
500.0000 mg | ORAL_TABLET | Freq: Two times a day (BID) | ORAL | 0 refills | Status: AC
Start: 2022-12-12 — End: 2022-12-19

## 2022-12-12 MED ORDER — KETOCONAZOLE 2 % EX CREA
1.0000 | TOPICAL_CREAM | Freq: Two times a day (BID) | CUTANEOUS | 0 refills | Status: DC
Start: 2022-12-12 — End: 2023-03-21

## 2022-12-12 MED ORDER — CLOBETASOL PROPIONATE 0.05 % EX CREA
1.0000 | TOPICAL_CREAM | Freq: Two times a day (BID) | CUTANEOUS | 0 refills | Status: DC
Start: 2022-12-12 — End: 2023-12-31

## 2022-12-12 NOTE — Patient Instructions (Signed)

## 2022-12-12 NOTE — Progress Notes (Signed)
Acute Office Visit  Subjective:     Patient ID: Meredith Barnes, female    DOB: October 14, 1975, 47 y.o.   MRN: [id number]  Chief Complaint  Patient presents with   Vaginitis    HPI Patient is in today for vaginal irritation and discharge. She has hx of genital herpes and she wants everything checked out. Denies any vaginal odor or pain. She does have some irritation and itching more on vulva than internally. No pain with intercourse of abdominal pain. No pain with urination. No fever, chills, body aches. Not tried anything to make better.   Pt request something for darkened areas and at times scaly around nose. Failed Lotrisone.   She has some irritation to glue from wig around circumference of her head. Never happened before.  .. Active Ambulatory Problems    Diagnosis Date Noted   ANEMIA, IRON DEFICIENCY, UNSPEC. 04/30/2006   Migraine without aura 05/13/2007   ASTHMA 06/20/2006   HEMATURIA 09/23/2006   OVARIAN CYST 02/07/2009   ECZEMA 06/20/2006   FATIGUE/MALAISE 04/30/2006   IFG (impaired fasting glucose) 05/27/2012   Tenosynovitis of foot and ankle 12/23/2013   Alopecia of scalp 02/01/2020   Flatulence 02/27/2021   History of partial colectomy 02/27/2021   Primary osteoarthritis of left knee 02/27/2021   Radiculitis of left cervical region 04/18/2021   Impingement syndrome, shoulder, right 05/30/2021   Acute cough 02/14/2022   Bronchitis 02/14/2022   Irritant contact dermatitis due to cosmetics 12/18/2022   Seborrheic dermatitis 12/18/2022   Resolved Ambulatory Problems    Diagnosis Date Noted   URI 08/12/2009   BACK PAIN, THORACIC REGION 02/24/2009   SWELLING, NECK 03/11/2008   PELVIC  PAIN 10/18/2006   Abdominal tenderness, left lower quadrant 01/07/2009   MAMMOGRAM, ABNORMAL, LEFT 10/18/2006   Edema 12/25/2013   Pain in lower limb 12/25/2013   Past Medical History:  Diagnosis Date   Alopecia    Arthritis    Asthma    Cyst, ovary, follicular     Diverticulitis    Ectopic pregnancy    H. pylori infection    Hx of hysterectomy partial for cervicl CA 2003   Migraine headache    Migraines    Sigmoidoscopy exam partial sigmoid colectomy 07-23-08     ROS See HPI.      Objective:    BP (!) 142/78   Pulse 71   Ht 5\' 5"  (1.651 m)   Wt 277 lb (125.6 kg)   SpO2 99%   BMI 46.10 kg/m  BP Readings from Last 3 Encounters:  12/12/22 (!) 142/78  03/20/22 134/89  12/19/21 126/87   Wt Readings from Last 3 Encounters:  12/12/22 277 lb (125.6 kg)  03/20/22 269 lb (122 kg)  01/24/22 267 lb (121.1 kg)   .Marland Kitchen Results for orders placed or performed in visit on 12/12/22  WET PREP FOR TRICH, YEAST, CLUE   Specimen: Vaginal Fluid  Result Value Ref Range   MICRO NUMBER: [id number]    Specimen Quality Adequate    SOURCE: NOT GIVEN    Status FINAL    RESULT (A)     No Trichomonas vaginalis seen. No yeast seen Clue cells seen Epithelial Cells Present  TIQ-NTM  Result Value Ref Range   QUESTION/PROBLEM:     SPECIMEN(S) RECEIVED: Specimen VM unneeded        Physical Exam Constitutional:      Appearance: Normal appearance. She is obese.  HENT:     Head: Normocephalic.  Cardiovascular:  Rate and Rhythm: Normal rate and regular rhythm.     Pulses: Normal pulses.  Pulmonary:     Effort: Pulmonary effort is normal.     Breath sounds: Normal breath sounds.  Abdominal:     General: Bowel sounds are normal. There is no distension.     Palpations: Abdomen is soft. There is no mass.     Tenderness: There is no abdominal tenderness. There is no right CVA tenderness, left CVA tenderness, guarding or rebound.     Hernia: No hernia is present.  Genitourinary:    Rectum: Normal.     Comments: Brownish discharge with some odor Bilateral vulva with erythema Musculoskeletal:     Right lower leg: No edema.     Left lower leg: No edema.  Skin:    Comments: Darkening of skin folds around nose  Erythema and raised area where glue from wig  was attached around scalp.   Neurological:     General: No focal deficit present.     Mental Status: She is alert and oriented to person, place, and time.  Psychiatric:        Mood and Affect: Mood normal.        Behavior: Behavior normal.          Assessment & Plan:  Marland KitchenMarland KitchenMakibu was seen today for vaginitis.  Diagnoses and all orders for this visit:  Vaginal discharge -     Cancel: WET PREP FOR TRICH, YEAST, CLUE -     metroNIDAZOLE (FLAGYL) 500 MG tablet; Take 1 tablet (500 mg total) by mouth 2 (two) times daily for 7 days. -     Herpes simplex virus culture -     WET PREP FOR TRICH, YEAST, CLUE -     TIQ-NTM  Vaginal irritation -     Cancel: WET PREP FOR TRICH, YEAST, CLUE -     metroNIDAZOLE (FLAGYL) 500 MG tablet; Take 1 tablet (500 mg total) by mouth 2 (two) times daily for 7 days. -     Herpes simplex virus culture -     WET PREP FOR TRICH, YEAST, CLUE -     TIQ-NTM  Seborrheic dermatitis -     ketoconazole (NIZORAL) 2 % cream; Apply 1 Application topically 2 (two) times daily. To affected areas in skin folds around nose.  Irritant contact dermatitis due to cosmetics -     clobetasol cream (TEMOVATE) 0.05 %; Apply 1 Application topically 2 (two) times daily. On scalp due to skin irritation.   BV was detected and pt was sent metronidazole Discussed prevention No appearance of herpes-culture sent off Ketoconazole for facial skin folds for seborrheic dermatitis. Clobetasol for irritation of glue from wig Follow up as needed or with any new or worsening symptoms   Tandy Gaw, PA-C

## 2022-12-12 NOTE — Progress Notes (Signed)
Clue cells were positive and support bacterial vaginosis diagnosis. Metronidazole should treat!

## 2022-12-18 ENCOUNTER — Encounter: Payer: Self-pay | Admitting: Physician Assistant

## 2022-12-18 DIAGNOSIS — L243 Irritant contact dermatitis due to cosmetics: Secondary | ICD-10-CM | POA: Insufficient documentation

## 2022-12-18 DIAGNOSIS — L219 Seborrheic dermatitis, unspecified: Secondary | ICD-10-CM | POA: Insufficient documentation

## 2022-12-18 LAB — HERPES SIMPLEX VIRUS CULTURE

## 2022-12-18 LAB — TIQ-NTM

## 2022-12-19 ENCOUNTER — Telehealth: Payer: Self-pay | Admitting: Family Medicine

## 2022-12-19 NOTE — Telephone Encounter (Signed)
Herpes culture wasn't frozen so sample not run. Please notify pt.  If still has a lesion, then need to repeat swab we can

## 2022-12-21 ENCOUNTER — Ambulatory Visit (INDEPENDENT_AMBULATORY_CARE_PROVIDER_SITE_OTHER): Payer: No Typology Code available for payment source | Admitting: Family Medicine

## 2022-12-21 ENCOUNTER — Encounter: Payer: Self-pay | Admitting: Family Medicine

## 2022-12-21 VITALS — BP 124/83 | HR 84 | Ht 65.0 in | Wt 279.0 lb

## 2022-12-21 DIAGNOSIS — Z114 Encounter for screening for human immunodeficiency virus [HIV]: Secondary | ICD-10-CM | POA: Diagnosis not present

## 2022-12-21 DIAGNOSIS — Z0000 Encounter for general adult medical examination without abnormal findings: Secondary | ICD-10-CM

## 2022-12-21 DIAGNOSIS — N760 Acute vaginitis: Secondary | ICD-10-CM

## 2022-12-21 DIAGNOSIS — G4710 Hypersomnia, unspecified: Secondary | ICD-10-CM | POA: Diagnosis not present

## 2022-12-21 DIAGNOSIS — Z113 Encounter for screening for infections with a predominantly sexual mode of transmission: Secondary | ICD-10-CM

## 2022-12-21 DIAGNOSIS — Z1159 Encounter for screening for other viral diseases: Secondary | ICD-10-CM

## 2022-12-21 DIAGNOSIS — L219 Seborrheic dermatitis, unspecified: Secondary | ICD-10-CM

## 2022-12-21 LAB — CBC
Hemoglobin: 11.7 g/dL (ref 11.7–15.5)
MCHC: 31.5 g/dL — ABNORMAL LOW (ref 32.0–36.0)
MCV: 75.2 fL — ABNORMAL LOW (ref 80.0–100.0)
Platelets: 317 10*3/uL (ref 140–400)
RBC: 4.95 10*6/uL (ref 3.80–5.10)

## 2022-12-21 NOTE — Assessment & Plan Note (Signed)
Skin scraping performed and rash her chin.  It still present has not really gotten much better.  Did not seem to respond to the Lotrisone cream.

## 2022-12-21 NOTE — Progress Notes (Unsigned)
Complete physical exam  Patient: Meredith Barnes   DOB: 1975/10/11   46 y.o. Female  MRN: [id number]  Subjective:    Chief Complaint  Patient presents with   Annual Exam    Meredith Barnes is a 47 y.o. female who presents today for a complete physical exam. She reports consuming a general diet. She generally feels well. She reports sleeping well.  Back she is feels like she might be sleeping a little too much.  She reports that her mood has been good.  She does not have additional problems to discuss today.   She was recently treated for BV with metronidazole.  Also given ketoconazole and clobetasol for seborrheic dermatitis she feels like this been working a little better and is not burning or irritating the skin like the lotrisone cream.   Most recent fall risk assessment:    12/21/2022    4:13 PM  Fall Risk   Falls in the past year? 0  Number falls in past yr: 0  Injury with Fall? 0  Risk for fall due to : No Fall Risks  Follow up Falls evaluation completed     Most recent depression screenings:    12/21/2022    4:13 PM 08/14/2022    2:41 PM  PHQ 2/9 Scores  PHQ - 2 Score 0 0    {VISON DENTAL STD PSA (Optional):27386}  {History (Optional):23778}  Patient Care Team: Agapito Games, MD as PCP - General (Family Medicine) Glyn Ade, PA-C as Physician Assistant (Dermatology)   Outpatient Medications Prior to Visit  Medication Sig   albuterol (VENTOLIN HFA) 108 (90 Base) MCG/ACT inhaler TAKE 2 PUFFS BY MOUTH EVERY 6 HOURS AS NEEDED FOR WHEEZE OR SHORTNESS OF BREATH   Cholecalciferol 1.25 MG (50000 UT) capsule Take 50,000 Units by mouth once a week.   clobetasol cream (TEMOVATE) 0.05 % Apply 1 Application topically 2 (two) times daily. On scalp due to skin irritation.   clotrimazole-betamethasone (LOTRISONE) cream Apply 1 Application topically daily as needed.   ipratropium (ATROVENT) 0.06 % nasal spray PLACE 2 SPRAYS INTO BOTH NOSTRILS IN THE  MORNING AND AT BEDTIME.   ketoconazole (NIZORAL) 2 % cream Apply 1 Application topically 2 (two) times daily. To affected areas in skin folds around nose.   valACYclovir (VALTREX) 500 MG tablet Take 1 tablet (500 mg total) by mouth 2 (two) times daily as needed. For 3 to 5 days at the first sign of outbreak   No facility-administered medications prior to visit.    ROS        Objective:     BP 124/83   Pulse 84   Ht 5\' 5"  (1.651 m)   Wt 279 lb (126.6 kg)   SpO2 99%   BMI 46.43 kg/m  {Vitals History (Optional):23777}  Physical Exam Vitals and nursing note reviewed. Exam conducted with a chaperone present.  Constitutional:      Appearance: She is well-developed.  HENT:     Head: Normocephalic and atraumatic.     Right Ear: External ear normal.     Left Ear: External ear normal.     Nose: Nose normal.  Eyes:     Conjunctiva/sclera: Conjunctivae normal.     Pupils: Pupils are equal, round, and reactive to light.  Neck:     Thyroid: No thyromegaly.  Cardiovascular:     Rate and Rhythm: Normal rate and regular rhythm.     Heart sounds: Normal heart sounds.  Pulmonary:  Effort: Pulmonary effort is normal.     Breath sounds: Normal breath sounds. No wheezing.  Chest:     Chest wall: No mass.  Breasts:    Right: Normal.     Left: Normal.  Musculoskeletal:     Cervical back: Neck supple.  Lymphadenopathy:     Cervical: No cervical adenopathy.     Upper Body:     Right upper body: No supraclavicular or axillary adenopathy.     Left upper body: No supraclavicular or axillary adenopathy.  Skin:    General: Skin is warm and dry.  Neurological:     Mental Status: She is alert and oriented to person, place, and time.  Psychiatric:        Behavior: Behavior normal.      No results found for any visits on 12/21/22. {Show previous labs (optional):23779}    Assessment & Plan:    Routine Health Maintenance and Physical Exam  Immunization History  Administered  Date(s) Administered   Influenza Whole 04/22/2006   PFIZER(Purple Top)SARS-COV-2 Vaccination 10/31/2019, 11/27/2019, 08/01/2020   Tdap 07/03/2019    Health Maintenance  Topic Date Due   COVID-19 Vaccine (4 - 2023-24 season) 08/31/2023 (Originally 03/23/2022)   INFLUENZA VACCINE  02/21/2023   Colonoscopy  08/17/2024   DTaP/Tdap/Td (2 - Td or Tdap) 07/02/2029   Hepatitis C Screening  Completed   HIV Screening  Completed   Pneumococcal Vaccine 35-69 Years old  Aged Out   HPV VACCINES  Aged Out    Discussed health benefits of physical activity, and encouraged her to engage in regular exercise appropriate for her age and condition.  Problem List Items Addressed This Visit       Musculoskeletal and Integument   Seborrheic dermatitis    Skin scraping performed and rash her chin.  It still present has not really gotten much better.  Did not seem to respond to the Lotrisone cream.       Other Visit Diagnoses     Wellness examination    -  Primary   Relevant Orders   Lipid Panel w/reflex Direct LDL   COMPLETE METABOLIC PANEL WITH GFR   TSH   CBC   HgB A1c   Fe+TIBC+Fer   B12   HIV Antibody (routine testing w rflx)   RPR   Hepatitis C Antibody   Hepatitis B Surface AntiBODY   Excessive sleepiness       Relevant Orders   Lipid Panel w/reflex Direct LDL   COMPLETE METABOLIC PANEL WITH GFR   TSH   CBC   HgB A1c   Fe+TIBC+Fer   B12   Screening for HIV without presence of risk factors       Relevant Orders   HIV Antibody (routine testing w rflx)   Encounter for hepatitis C screening test for low risk patient       Relevant Orders   Hepatitis C Antibody   Screening examination for STI       Relevant Orders   HIV Antibody (routine testing w rflx)   RPR   Hepatitis C Antibody   Hepatitis B Surface AntiBODY       Keep up a regular exercise program and make sure you are eating a healthy diet Try to eat 4 servings of dairy a day, or if you are lactose intolerant take a  calcium with vitamin D daily.  Your vaccines are up to date.   Skin scraping performed and rash her chin.  It still present  has not really gotten much better.  Did not seem to respond to the Lotrisone cream.   IFG - due to recheck A1C   No follow-ups on file.     Nani Gasser, MD

## 2022-12-22 LAB — COMPLETE METABOLIC PANEL WITH GFR
AG Ratio: 1.4 (calc) (ref 1.0–2.5)
ALT: 16 U/L (ref 6–29)
AST: 13 U/L (ref 10–35)
Albumin: 4.3 g/dL (ref 3.6–5.1)
Alkaline phosphatase (APISO): 77 U/L (ref 31–125)
BUN: 10 mg/dL (ref 7–25)
CO2: 27 mmol/L (ref 20–32)
Calcium: 9.7 mg/dL (ref 8.6–10.2)
Chloride: 106 mmol/L (ref 98–110)
Creat: 0.75 mg/dL (ref 0.50–0.99)
Globulin: 3.1 g/dL (calc) (ref 1.9–3.7)
Glucose, Bld: 94 mg/dL (ref 65–99)
Potassium: 3.9 mmol/L (ref 3.5–5.3)
Sodium: 144 mmol/L (ref 135–146)
Total Bilirubin: 0.2 mg/dL (ref 0.2–1.2)
Total Protein: 7.4 g/dL (ref 6.1–8.1)
eGFR: 100 mL/min/{1.73_m2} (ref >=60)

## 2022-12-22 LAB — VITAMIN B12: Vitamin B-12: 353 pg/mL (ref 200–1100)

## 2022-12-22 LAB — CBC
HCT: 37.2 % (ref 35.0–45.0)
MCH: 23.6 pg — ABNORMAL LOW (ref 27.0–33.0)
MPV: 10.6 fL (ref 7.5–12.5)
RDW: 16.2 % — ABNORMAL HIGH (ref 11.0–15.0)
WBC: 8.2 10*3/uL (ref 3.8–10.8)

## 2022-12-22 LAB — HEPATITIS C ANTIBODY: Hepatitis C Ab: NONREACTIVE

## 2022-12-22 LAB — LIPID PANEL W/REFLEX DIRECT LDL
Cholesterol: 202 mg/dL — ABNORMAL HIGH (ref <200)
HDL: 49 mg/dL — ABNORMAL LOW (ref >=50)
LDL Cholesterol (Calc): 128 mg/dL (calc) — ABNORMAL HIGH
Non-HDL Cholesterol (Calc): 153 mg/dL (calc) — ABNORMAL HIGH (ref <130)
Total CHOL/HDL Ratio: 4.1 (calc) (ref <5.0)
Triglycerides: 140 mg/dL (ref <150)

## 2022-12-22 LAB — IRON,TIBC AND FERRITIN PANEL
%SAT: 11 % (calc) — ABNORMAL LOW (ref 16–45)
Ferritin: 73 ng/mL (ref 16–232)
Iron: 35 ug/dL — ABNORMAL LOW (ref 40–190)
TIBC: 321 mcg/dL (calc) (ref 250–450)

## 2022-12-22 LAB — TSH: TSH: 1.98 mIU/L

## 2022-12-22 LAB — HEMOGLOBIN A1C
Hgb A1c MFr Bld: 6.5 % of total Hgb — ABNORMAL HIGH (ref <5.7)
Mean Plasma Glucose: 140 mg/dL
eAG (mmol/L): 7.7 mmol/L

## 2022-12-22 LAB — RPR: RPR Ser Ql: NONREACTIVE

## 2022-12-22 LAB — HEPATITIS B SURFACE ANTIBODY,QUALITATIVE: Hep B S Ab: REACTIVE — AB

## 2022-12-22 LAB — HIV ANTIBODY (ROUTINE TESTING W REFLEX): HIV 1&2 Ab, 4th Generation: NONREACTIVE

## 2022-12-24 ENCOUNTER — Encounter: Payer: Self-pay | Admitting: Family Medicine

## 2022-12-25 MED ORDER — METRONIDAZOLE 500 MG PO TABS
500.0000 mg | ORAL_TABLET | Freq: Two times a day (BID) | ORAL | 0 refills | Status: DC
Start: 2022-12-25 — End: 2023-03-21

## 2022-12-25 NOTE — Addendum Note (Signed)
Addended by: Nani Gasser D on: 12/25/2022 12:11 PM   Modules accepted: Orders

## 2022-12-25 NOTE — Progress Notes (Signed)
HI Meredith Barnes, the wet prep did show a little overgrowth of bacteria.  I will send over prescription for metronidazole to clear that up.  LDL cholesterol is mildly elevated similar to last year.  Just continue to work on healthy diet and regular exercise.  Hemoglobin is normal at 11.7 but the cells are small but similar to what they have been in the past.  A1c did jump up significantly to 6.5.  This is technically in the diabetes range.  Really want to work on cutting back on sweets and sugars and then plan to schedule follow-up with me in 3 months to retest her A1c and make sure that it is improving.  Your iron is low.  If you are not currently taking any extra iron, please restart.  We can recheck your level again and when I see you in 3 months.  Negative for syphilis, hepatitis B, HIV.  B12 and thyroid look good.  Otitis B surface antibody is positive which means that you have been vaccinated.

## 2022-12-26 LAB — WET PREP FOR TRICH, YEAST, CLUE
MICRO NUMBER:: 15026075
Specimen Quality: ADEQUATE

## 2022-12-26 LAB — FUNGAL STAIN
FUNGAL SMEAR:: NONE SEEN
MICRO NUMBER:: 15026074
SPECIMEN QUALITY:: ADEQUATE

## 2022-12-26 NOTE — Progress Notes (Signed)
Skin scraping negative for fungal elements. Are you using the ketoconazole cream and the clobetasol now?

## 2022-12-29 MED ORDER — PIMECROLIMUS 1 % EX CREA
TOPICAL_CREAM | Freq: Two times a day (BID) | CUTANEOUS | 0 refills | Status: DC
Start: 2022-12-29 — End: 2023-12-31

## 2022-12-29 NOTE — Progress Notes (Signed)
OK we could try a cream called Elidel which is for eczema, since the scraping was negative for fungus.  We can try that for 2-3 months and see if it is working better. If not, then I am happy to refer to Dermatology for further evaluation.   Orders Placed This Encounter     metroNIDAZOLE (FLAGYL) 500 MG tablet         Sig: Take 1 tablet (500 mg total) by mouth 2 (two) times daily.         Dispense:  14 tablet         Refill:  0     pimecrolimus (ELIDEL) 1 % cream         Sig: Apply topically 2 (two) times daily.         Dispense:  30 g         Refill:  0

## 2022-12-29 NOTE — Addendum Note (Signed)
Addended by: Nani Gasser D on: 12/29/2022 04:01 PM   Modules accepted: Orders

## 2023-01-30 ENCOUNTER — Encounter: Payer: No Typology Code available for payment source | Admitting: Family Medicine

## 2023-02-04 ENCOUNTER — Encounter: Payer: Self-pay | Admitting: Physician Assistant

## 2023-02-04 ENCOUNTER — Ambulatory Visit: Payer: No Typology Code available for payment source

## 2023-02-04 ENCOUNTER — Ambulatory Visit (INDEPENDENT_AMBULATORY_CARE_PROVIDER_SITE_OTHER): Payer: No Typology Code available for payment source | Admitting: Physician Assistant

## 2023-02-04 VITALS — BP 119/83 | HR 64 | Ht 65.0 in | Wt 270.0 lb

## 2023-02-04 DIAGNOSIS — S99912A Unspecified injury of left ankle, initial encounter: Secondary | ICD-10-CM

## 2023-02-04 DIAGNOSIS — M25572 Pain in left ankle and joints of left foot: Secondary | ICD-10-CM

## 2023-02-04 DIAGNOSIS — S93402A Sprain of unspecified ligament of left ankle, initial encounter: Secondary | ICD-10-CM

## 2023-02-04 NOTE — Patient Instructions (Addendum)
Wear ankle brace for 4-6 weeks with good supportive shoe.  Ankle Sprain, Phase I Rehab An ankle sprain is an injury to the tissues that connect bone to bone (ligaments) in your ankle. Ankle sprains can cause stiffness, loss of motion, and loss of strength. Ask your health care provider which exercises are safe for you. Do exercises exactly as told by your provider and adjust them as directed. It is normal to feel mild stretching, pulling, tightness, or discomfort as you do these exercises. Stop right away if you feel sudden pain or your pain gets worse. Do not begin these exercises until told by your provider. Stretching and range-of-motion exercises These exercises warm up your muscles and joints. They can improve the movement and flexibility of your lower leg and ankle. They also help to relieve pain and stiffness. Gastroc and soleus stretch This exercise is also called a calf stretch. It stretches the muscles in the back of the lower leg. These muscles are the gastrocnemius, or gastroc, and the soleus. Sit on the floor with your left / right leg extended. Loop a belt or towel around the ball of your left / right foot. The ball of your foot is on the walking surface, right under your toes. Keep your left / right ankle and foot relaxed and keep your knee straight. Use the belt or towel to pull your foot toward you. You should feel a gentle stretch behind your calf or knee in your gastroc muscle. Hold this position for __________ seconds, then release to the starting position. Repeat the exercise with your knee bent. You can put a pillow or a rolled bath towel under your knee to support it. You should feel a stretch deep in your calf in the soleus muscle or at your Achilles tendon. Repeat __________ times. Complete this exercise __________ times a day. Ankle alphabet  Sit with your left / right leg supported at the lower leg. Do not rest your foot on anything. Make sure your foot has room to move  freely. Think of your left / right foot as a paintbrush. Move your foot to trace each letter of the alphabet in the air. Keep your hip and knee still while you trace. Make the letters as large as you can without feeling discomfort. Trace every letter from A to Z. Repeat __________ times. Complete this exercise __________ times a day. Strengthening exercises These exercises build strength and endurance in your ankle and lower leg. Endurance is the ability to use your muscles for a long time, even after they get tired. Ankle dorsiflexion  Secure a rubber exercise band or tube to an object, such as a table leg, that will stay still when the band is pulled. Secure the other end around your left / right foot. Sit on the floor facing the object, with your left / right leg extended. The band or tube should be slightly tense when your foot is relaxed. Slowly bring your foot toward you, bringing the top of your foot toward your shin (dorsiflexion), and pulling the band tighter. Hold this position for __________ seconds. Slowly return your foot to the starting position. Repeat __________ times. Complete this exercise __________ times a day. Ankle plantar flexion  Sit on the floor with your left / right leg extended. Loop a rubber exercise tube or band around the ball of your left / right foot. The ball of your foot is on the walking surface, right under your toes. Hold the ends of the band  or tube in your hands. The band or tube should be slightly tense when your foot is relaxed. Slowly point your foot and toes downward to tilt the top of your foot away from your shin (plantar flexion). Hold this position for __________ seconds. Slowly return your foot to the starting position. Repeat __________ times. Complete this exercise __________ times a day. Ankle eversion  Sit on the floor with your legs straight out in front of you. Loop a rubber exercise band or tube around the ball of your left / right  foot. The ball of your foot is on the walking surface, right under your toes. Hold the ends of the band in your hands or secure the band to a stable object. The band or tube should be slightly tense when your foot is relaxed. Slowly push your foot outward, away from your other leg (eversion). Hold this position for __________ seconds. Slowly return your foot to the starting position. Repeat __________ times. Complete this exercise __________ times a day. This information is not intended to replace advice given to you by your health care provider. Make sure you discuss any questions you have with your health care provider. Document Revised: 05/02/2022 Document Reviewed: 05/02/2022 Elsevier Patient Education  2024 [employer].

## 2023-02-04 NOTE — Progress Notes (Signed)
Acute Office Visit  Subjective:     Patient ID: Meredith Barnes, female    DOB: 04/03/1976, 47 y.o.   MRN: [id number]  Chief Complaint  Patient presents with   Ankle Injury    Pt tripped over curb and twisted ankle. Swollen and painful    HPI Patient is in today for left lateral ankle pain after injury yesterday where she tripped over curb and inverted left ankle. Lateral left ankle is swollen and tender. She continues to be able to bear weight but there is some discomfort there. She has iced it a few times but not taken any medication.   .. Active Ambulatory Problems    Diagnosis Date Noted   ANEMIA, IRON DEFICIENCY, UNSPEC. 04/30/2006   Migraine without aura 05/13/2007   ASTHMA 06/20/2006   HEMATURIA 09/23/2006   OVARIAN CYST 02/07/2009   ECZEMA 06/20/2006   FATIGUE/MALAISE 04/30/2006   IFG (impaired fasting glucose) 05/27/2012   Tenosynovitis of foot and ankle 12/23/2013   Alopecia of scalp 02/01/2020   Flatulence 02/27/2021   History of partial colectomy 02/27/2021   Primary osteoarthritis of left knee 02/27/2021   Radiculitis of left cervical region 04/18/2021   Impingement syndrome, shoulder, right 05/30/2021   Irritant contact dermatitis due to cosmetics 12/18/2022   Seborrheic dermatitis 12/18/2022   Resolved Ambulatory Problems    Diagnosis Date Noted   URI 08/12/2009   BACK PAIN, THORACIC REGION 02/24/2009   SWELLING, NECK 03/11/2008   PELVIC  PAIN 10/18/2006   Abdominal tenderness, left lower quadrant 01/07/2009   MAMMOGRAM, ABNORMAL, LEFT 10/18/2006   Edema 12/25/2013   Pain in lower limb 12/25/2013   Acute cough 02/14/2022   Bronchitis 02/14/2022   Past Medical History:  Diagnosis Date   Alopecia    Arthritis    Asthma    Cyst, ovary, follicular    Diverticulitis    Ectopic pregnancy    H. pylori infection    Hx of hysterectomy partial for cervicl CA 2003   Migraine headache    Migraines    Sigmoidoscopy exam partial sigmoid colectomy  07-23-08     ROS See HPI.      Objective:    BP 119/83 (BP Location: Left Arm, Patient Position: Sitting, Cuff Size: Large)   Pulse 64   Ht 5\' 5"  (1.651 m)   Wt 270 lb (122.5 kg)   SpO2 100%   BMI 44.93 kg/m  BP Readings from Last 3 Encounters:  02/04/23 119/83  12/21/22 124/83  12/12/22 (!) 142/78   Wt Readings from Last 3 Encounters:  02/04/23 270 lb (122.5 kg)  12/21/22 279 lb (126.6 kg)  12/12/22 277 lb (125.6 kg)      Physical Exam Constitutional:      Appearance: Normal appearance. She is obese.  HENT:     Head: Normocephalic.  Cardiovascular:     Rate and Rhythm: Normal rate.  Pulmonary:     Effort: Pulmonary effort is normal.  Musculoskeletal:        General: Swelling, tenderness and signs of injury present.     Right lower leg: No edema.     Left lower leg: Edema present.     Comments: Left foot:  NROM  Tenderness over posterior lateral malleous and into posterior ankle with significant swelling noted   Neurological:     General: No focal deficit present.     Mental Status: She is alert and oriented to person, place, and time.  Psychiatric:  Mood and Affect: Mood normal.          Assessment & Plan:  Marland KitchenMarland KitchenMakibu was seen today for ankle injury.  Diagnoses and all orders for this visit:  Sprain of left ankle, unspecified ligament, initial encounter  Injury of left ankle, initial encounter -     DG Foot Complete Left; Future -     DG Ankle Complete Left; Future  Acute left ankle pain -     DG Foot Complete Left; Future -     DG Ankle Complete Left; Future   Stat xray showed no fracture in foot and possible tiny avulsion fracture with significant swelling in lateral ankle.  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    Meredith Gaw, PA-C

## 2023-03-17 IMAGING — DX DG CERVICAL SPINE COMPLETE 4+V
7 series · 7 of 7 positions shown · non-contrast
Comparison: None.

CLINICAL DATA: Neck pain for 1 week, pain radiating to left arm

EXAM:
CERVICAL SPINE - COMPLETE 4+ VIEW

[c-spine lat]
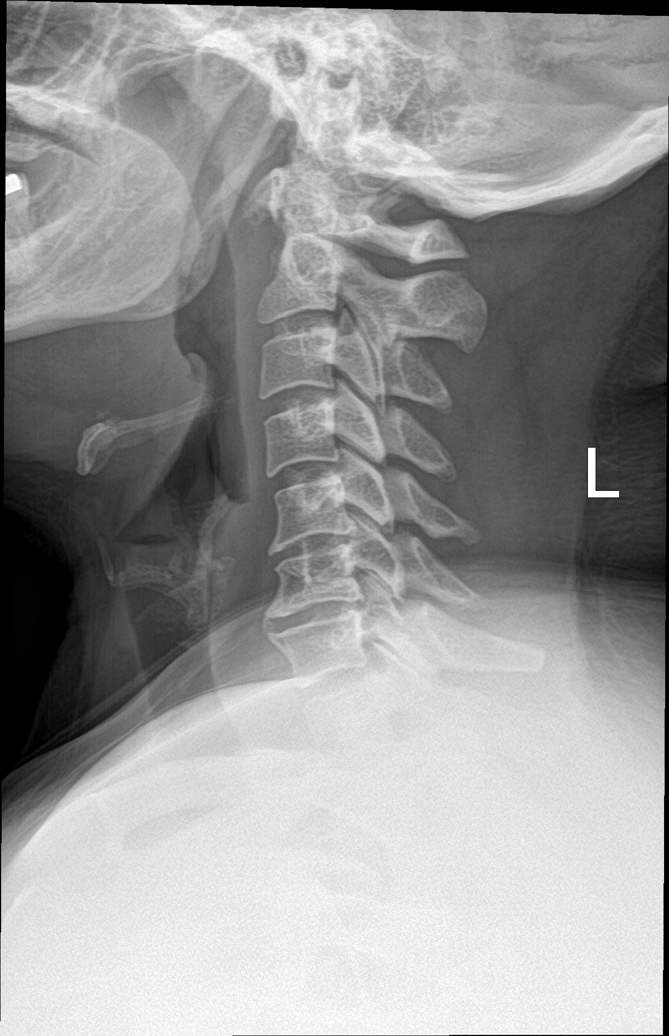

[c-spine obl (1 of 2)]
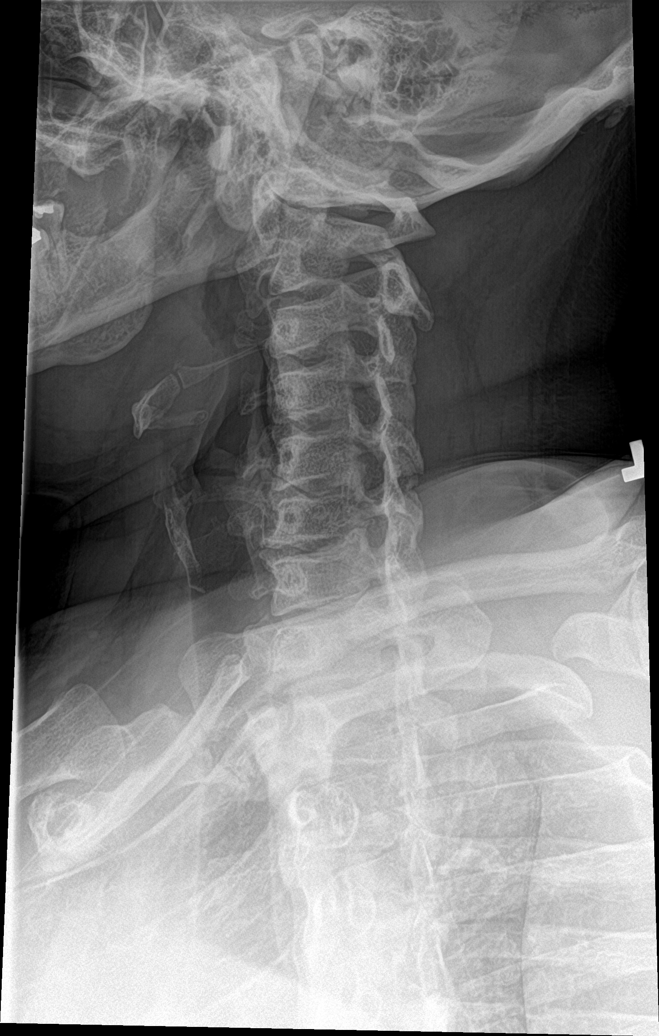

[c-spine obl (2 of 2)]
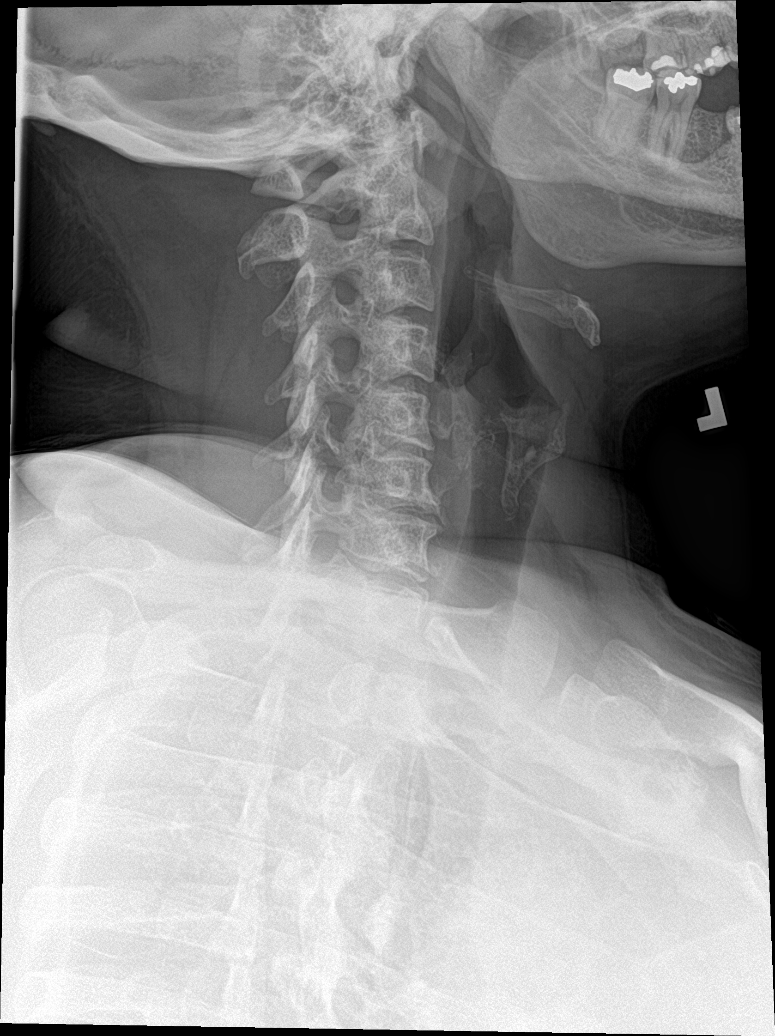

[c-spine ap]
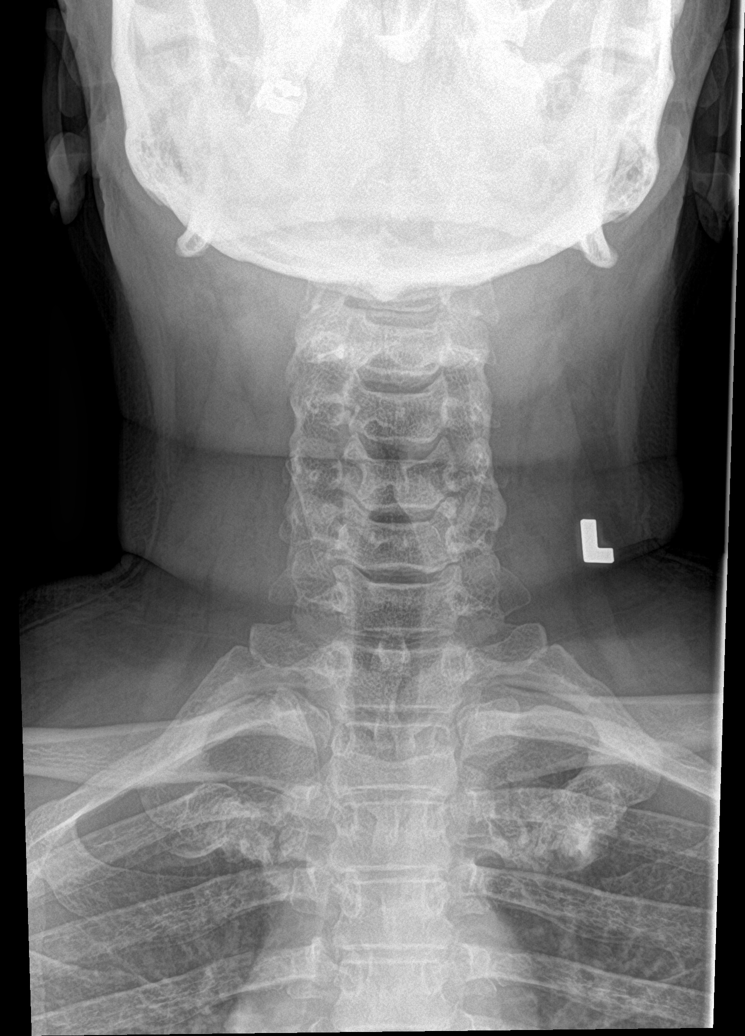

[c-spine open mouth (1 of 2)]
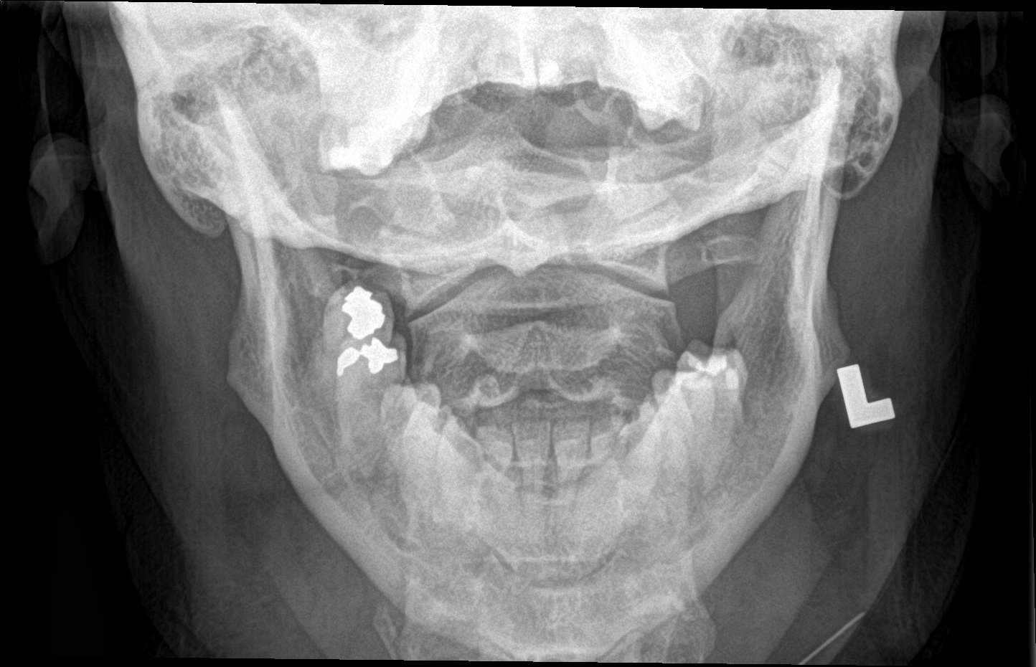

[c-spine swimmers]
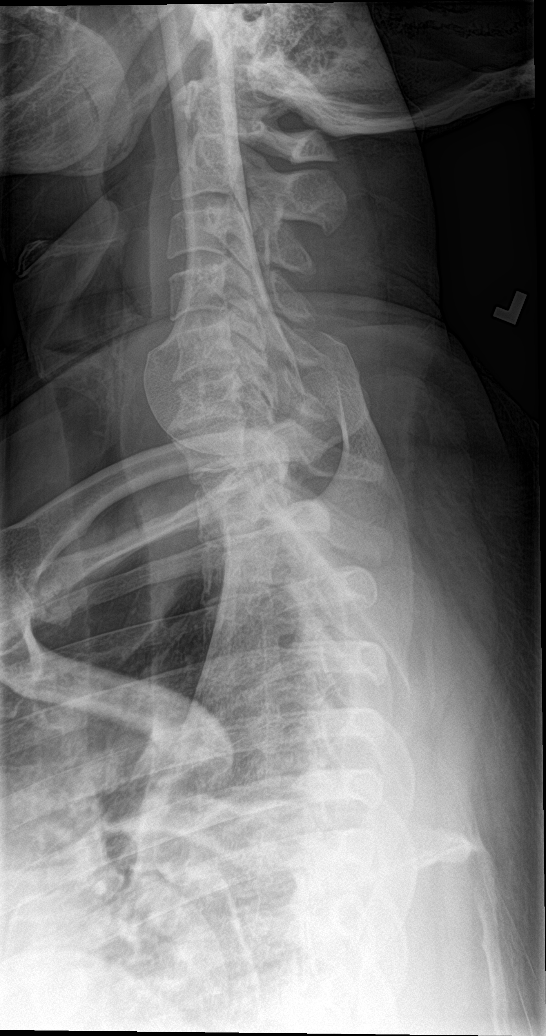

[c-spine open mouth (2 of 2)]
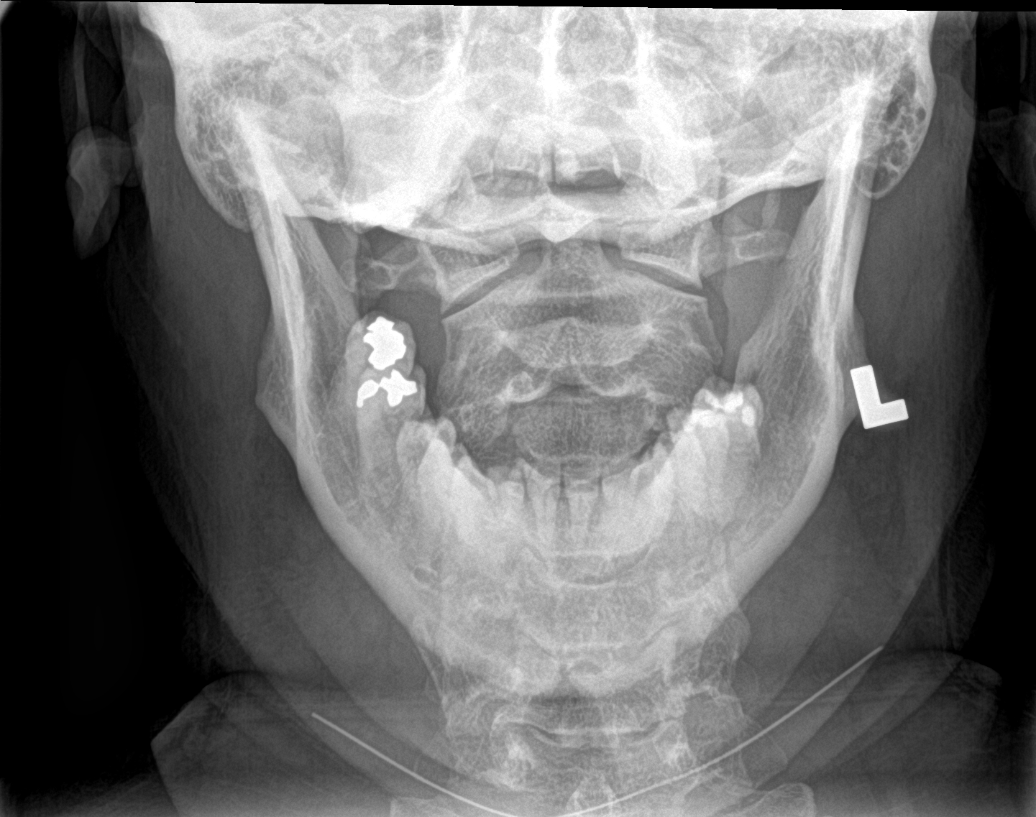

[7 of 7 positions shown; findings below may reference images not displayed]

FINDINGS: Frontal, bilateral oblique, and lateral views of the cervical spine
are obtained. Alignment is anatomic to the cervicothoracic junction.
Prominent lower cervical spondylosis and facet hypertrophy greatest
at C6-7. There is mild symmetrical neural foraminal encroachment at
C5-6 with right predominant narrowing at C6-7. No acute fractures.
Prevertebral soft tissues are unremarkable. Lung apices are clear.
IMPRESSION: 1. Lower cervical spondylosis and facet hypertrophy, most pronounced
at C5-6 and C6-7 as above.

## 2023-03-21 ENCOUNTER — Encounter: Payer: Self-pay | Admitting: Medical-Surgical

## 2023-03-21 ENCOUNTER — Telehealth: Payer: No Typology Code available for payment source | Admitting: Medical-Surgical

## 2023-03-21 DIAGNOSIS — R0989 Other specified symptoms and signs involving the circulatory and respiratory systems: Secondary | ICD-10-CM

## 2023-03-21 DIAGNOSIS — J101 Influenza due to other identified influenza virus with other respiratory manifestations: Secondary | ICD-10-CM | POA: Diagnosis not present

## 2023-03-21 DIAGNOSIS — U071 COVID-19: Secondary | ICD-10-CM | POA: Diagnosis not present

## 2023-03-21 LAB — POCT INFLUENZA A/B
Influenza A, POC: NEGATIVE
Influenza B, POC: POSITIVE — AB

## 2023-03-21 LAB — POC COVID19 BINAXNOW: SARS Coronavirus 2 Ag: POSITIVE — AB

## 2023-03-21 MED ORDER — NIRMATRELVIR/RITONAVIR (PAXLOVID)TABLET: 3.0000 | ORAL_TABLET | Freq: Two times a day (BID) | ORAL | 0 refills | Status: AC

## 2023-03-21 MED ORDER — OSELTAMIVIR PHOSPHATE 75 MG PO CAPS: 75.0000 mg | ORAL_CAPSULE | Freq: Two times a day (BID) | ORAL | 0 refills | Status: DC

## 2023-03-21 NOTE — Progress Notes (Signed)
Virtual Visit via Video Note  I connected with Meredith Barnes on 03/21/23 at 11:10 AM EDT by a video enabled telemedicine application and verified that I am speaking with the correct person using two identifiers.   I discussed the limitations of evaluation and management by telemedicine and the availability of in person appointments. The patient expressed understanding and agreed to proceed.  Patient location: home Provider locations: office  Subjective:    CC: Sinus symptoms  HPI: Pleasant 47 year old female presenting via MyChart video visit with reports of sinus symptoms that started yesterday.  Reports chills, headache, body aches, facial pain, and poor appetite.  Took a COVID test at home with negative results yesterday.  Has been afebrile.  No nausea, vomiting, or diarrhea.  Able to eat and drink without difficulty.  Has been taking DayQuil, Alka-Seltzer, and using nasal spray with minimal benefit.   Past medical history, Surgical history, Family history not pertinant except as noted below, Social history, Allergies, and medications have been entered into the medical record, reviewed, and corrections made.   Review of Systems: See HPI for pertinent positives and negatives.   Objective:    General: Speaking clearly in complete sentences without any shortness of breath.  Alert and oriented x3.  Normal judgment. No apparent acute distress.  Impression and Recommendations:    1. Respiratory symptoms Discussed likelihood of this being a viral infection given abrupt onset.  Recommend retesting for COVID and initial testing for influenza.  Patient is agreeable to doing a drive up swab for further evaluation. - POCT Influenza A/B - POC COVID-19  2. COVID-19 virus infection Swab positive.  Starting Paxlovid twice daily x 5 days.  Discussed conservative measures for symptomatic management.  List of over-the-counter options provided via MyChart.  Discussed CDC recommendations for  quarantine and return to work.  Work note provided.  3. Influenza B Positive for influenza B.  Adding Tamiflu twice daily x 5 days.  Symptomatic management and over-the-counter options as noted above.  I discussed the assessment and treatment plan with the patient. The patient was provided an opportunity to ask questions and all were answered. The patient agreed with the plan and demonstrated an understanding of the instructions.   The patient was advised to call back or seek an in-person evaluation if the symptoms worsen or if the condition fails to improve as anticipated.  Return if symptoms worsen or fail to improve.  Thayer Ohm, DNP, APRN, FNP-BC [facility]

## 2023-03-26 ENCOUNTER — Ambulatory Visit: Payer: No Typology Code available for payment source | Admitting: Family Medicine

## 2023-04-08 ENCOUNTER — Encounter: Payer: Self-pay | Admitting: Family Medicine

## 2023-04-08 DIAGNOSIS — A6004 Herpesviral vulvovaginitis: Secondary | ICD-10-CM

## 2023-04-08 DIAGNOSIS — Z0000 Encounter for general adult medical examination without abnormal findings: Secondary | ICD-10-CM

## 2023-04-08 MED ORDER — VALACYCLOVIR HCL 500 MG PO TABS
500.0000 mg | ORAL_TABLET | Freq: Two times a day (BID) | ORAL | 5 refills | Status: DC | PRN
Start: 2023-04-08 — End: 2024-04-02

## 2023-04-24 ENCOUNTER — Ambulatory Visit (INDEPENDENT_AMBULATORY_CARE_PROVIDER_SITE_OTHER): Payer: No Typology Code available for payment source | Admitting: Family Medicine

## 2023-04-24 ENCOUNTER — Encounter: Payer: Self-pay | Admitting: Family Medicine

## 2023-04-24 VITALS — BP 129/67 | HR 77 | Ht 65.0 in | Wt 271.5 lb

## 2023-04-24 DIAGNOSIS — G5601 Carpal tunnel syndrome, right upper limb: Secondary | ICD-10-CM

## 2023-04-24 DIAGNOSIS — J4520 Mild intermittent asthma, uncomplicated: Secondary | ICD-10-CM

## 2023-04-24 DIAGNOSIS — R7301 Impaired fasting glucose: Secondary | ICD-10-CM

## 2023-04-24 LAB — POCT GLYCOSYLATED HEMOGLOBIN (HGB A1C): Hemoglobin A1C: 6.1 % — AB (ref 4.0–5.6)

## 2023-04-24 NOTE — Progress Notes (Signed)
Established Patient Office Visit  Subjective   Patient ID: Meredith Barnes, female    DOB: [birth year]  Age: 47 y.o. MRN: [id number]  Chief Complaint  Patient presents with   impaired fasting glucose    HPI  Impaired fasting glucose-no increased thirst or urination. No symptoms consistent with hypoglycemia.  F/U Asthma - she is doing well. Had COVID and flu in August.  She has an inhaler that is up to date.    F/U Eczema -overall skin is doing well still getting a little bit of flaking around her nose.  So wanted to mention that she is getting some numbness ting tingling in her index and middle finger on her right hand.  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  It comes and goes.    ROS    Objective:     BP 129/67   Pulse 77   Ht 5\' 5"  (1.651 m)   Wt 271 lb 8 oz (123.2 kg)   SpO2 100%   BMI 45.18 kg/m    Physical Exam Vitals reviewed.  Constitutional:      Appearance: Normal appearance.  HENT:     Head: Normocephalic.  Pulmonary:     Effort: Pulmonary effort is normal.  Musculoskeletal:     Comments: Positive Tennille sign on the right wrist.  Otherwise no swelling over the joints.  Negative Phalen's.  Neurological:     Mental Status: She is alert and oriented to person, place, and time.  Psychiatric:        Mood and Affect: Mood normal.        Behavior: Behavior normal.      Results for orders placed or performed in visit on 04/24/23  POCT HgB A1C  Result Value Ref Range   Hemoglobin A1C 6.1 (A) 4.0 - 5.6 %   HbA1c POC (<> result, manual entry)     HbA1c, POC (prediabetic range)     HbA1c, POC (controlled diabetic range)        The 10-year ASCVD risk score (Arnett DK, et al., 2019) is: 1.6%    Assessment & Plan:   Problem List Items Addressed This Visit       Respiratory   Asthma    Doing well overall. Mild intermittent        Endocrine   IFG (impaired fasting glucose) - Primary    Well controlled. Continue  current regimen. Follow up in  11mo Much improved.  Continue work on [employer] and regular exercise.      Relevant Orders   POCT HgB A1C (Completed)   Other Visit Diagnoses     Carpal tunnel syndrome on right          Carpal tunnel, right-discussed diagnosis.  Recommend cock up splint to be worn at bedtime.  Additional information and handout provided if not improving recommend follow-up with Dr. Benjamin Stain for further evaluation. May need to look at her work station to reduce extra strain on the wrist.   Is also having some right knee pain and problems.  She had surgery back in February to clean it out but recently she felt a sudden pop and then a burning sensation in her knee and ever since then she has had pain from her mid thigh to her mid lower leg and some persistent popping.  She does have an appointment with her orthopedist tomorrow.  Return in about 6 months (around 10/23/2023) for Pre-diabetes.    Santina Evans  Linford Arnold, MD

## 2023-04-24 NOTE — Assessment & Plan Note (Signed)
Doing well overall. Mild intermittent

## 2023-04-24 NOTE — Assessment & Plan Note (Signed)
Well controlled. Continue current regimen. Follow up in  53mo Much improved.  Continue work on [employer] and regular exercise.

## 2023-06-07 ENCOUNTER — Encounter: Payer: Self-pay | Admitting: Family Medicine

## 2023-06-07 ENCOUNTER — Ambulatory Visit (INDEPENDENT_AMBULATORY_CARE_PROVIDER_SITE_OTHER): Payer: No Typology Code available for payment source | Admitting: Family Medicine

## 2023-06-07 ENCOUNTER — Ambulatory Visit: Payer: No Typology Code available for payment source | Admitting: Family Medicine

## 2023-06-07 VITALS — BP 130/90 | HR 81 | Temp 98.2°F | Resp 18 | Ht 65.0 in | Wt 270.3 lb

## 2023-06-07 DIAGNOSIS — B9689 Other specified bacterial agents as the cause of diseases classified elsewhere: Secondary | ICD-10-CM | POA: Diagnosis not present

## 2023-06-07 DIAGNOSIS — N760 Acute vaginitis: Secondary | ICD-10-CM

## 2023-06-07 MED ORDER — METRONIDAZOLE 500 MG PO TABS
500.0000 mg | ORAL_TABLET | Freq: Two times a day (BID) | ORAL | 0 refills | Status: AC
Start: 2023-06-07 — End: 2023-06-14

## 2023-06-07 NOTE — Progress Notes (Signed)
   Acute Office Visit  Subjective:     Patient ID: Meredith Barnes, female    DOB: 06/03/1976, 47 y.o.   MRN: [id number]  Chief Complaint  Patient presents with   Vaginitis    Patient states her symptoms started 2-3 days ago with an odor and some discharge    HPI Patient is in today for acute visit.  Vaginal Odor Pt reports she has a vaginal odor with wet undergarments. She reports this has been present for the last 3 days. She has had wet prep done back in May and made note of clue cells present. Pt denies sexual activity. No vaginal itching reported.   Review of Systems  Genitourinary:        Vaginal odor  All other systems reviewed and are negative.      Objective:    BP (!) 130/90   Pulse 81   Temp 98.2 F (36.8 C) (Oral)   Resp 18   Ht 5\' 5"  (1.651 m)   Wt 270 lb 4.8 oz (122.6 kg)   SpO2 98%   BMI 44.98 kg/m    Physical Exam Vitals and nursing note reviewed.  Constitutional:      Appearance: Normal appearance. She is normal weight.  HENT:     Head: Normocephalic and atraumatic.     Right Ear: External ear normal.     Left Ear: External ear normal.     Nose: Nose normal.     Mouth/Throat:     Mouth: Mucous membranes are moist.     Pharynx: Oropharynx is clear.  Eyes:     Conjunctiva/sclera: Conjunctivae normal.     Pupils: Pupils are equal, round, and reactive to light.  Cardiovascular:     Rate and Rhythm: Normal rate.  Pulmonary:     Effort: Pulmonary effort is normal.  Neurological:     General: No focal deficit present.     Mental Status: She is alert and oriented to person, place, and time. Mental status is at baseline.  Psychiatric:        Mood and Affect: Mood normal.        Behavior: Behavior normal.        Thought Content: Thought content normal.        Judgment: Judgment normal.   No results found for any visits on 06/07/23.      Assessment & Plan:   Problem List Items Addressed This Visit   None  Bacterial vaginosis -      metroNIDAZOLE; Take 1 tablet (500 mg total) by mouth 2 (two) times daily for 7 days.  Dispense: 14 tablet; Refill: 0   Symptoms consistent with bacterial vaginosis.  Treat with Flagyl 500mg  bid x 7 days Follow up with PCP for worsening symptoms No orders of the defined types were placed in this encounter.   No follow-ups on file.  Suzan Slick, MD

## 2023-06-10 ENCOUNTER — Encounter: Payer: Self-pay | Admitting: Family Medicine

## 2023-06-10 ENCOUNTER — Ambulatory Visit (INDEPENDENT_AMBULATORY_CARE_PROVIDER_SITE_OTHER): Payer: No Typology Code available for payment source | Admitting: Family Medicine

## 2023-06-10 VITALS — BP 131/61 | HR 97 | Ht 65.0 in | Wt 270.0 lb

## 2023-06-10 DIAGNOSIS — N760 Acute vaginitis: Secondary | ICD-10-CM

## 2023-06-10 DIAGNOSIS — B9689 Other specified bacterial agents as the cause of diseases classified elsewhere: Secondary | ICD-10-CM

## 2023-06-10 NOTE — Progress Notes (Signed)
  Pt reports that her sxs began 1 week ago. She was seen last week by Dr. Wyline Mood however she was only treated for her sxs.    She stated that she has had an odor and her underwear feel wet.       Wet prep performed.  Will call with results. She did start the tx for BV.

## 2023-06-10 NOTE — Progress Notes (Signed)
Pt reports that her sxs began 1 week ago. She was seen last week by Dr. Wyline Mood however she was only treated for her sxs.   She stated that she has had an odor and her underwear feel wet.

## 2023-06-13 LAB — NUSWAB VAGINITIS PLUS (VG+)
BVAB 2: HIGH {score} — AB
Candida albicans, NAA: NEGATIVE
Candida glabrata, NAA: NEGATIVE
Chlamydia trachomatis, NAA: NEGATIVE
Megasphaera 1: HIGH {score} — AB
Neisseria gonorrhoeae, NAA: NEGATIVE
Trich vag by NAA: NEGATIVE

## 2023-06-13 NOTE — Progress Notes (Signed)
Hi [name], so the swab came back showing bacterial vaginitis.  No yeast which is great,  so hopefully you are feeling better.  No gonorrhea, chlamydia or trichomoniasis either.  Let us know if you are still having any concerns.

## 2023-07-13 ENCOUNTER — Other Ambulatory Visit: Payer: Self-pay | Admitting: Family Medicine

## 2023-07-13 DIAGNOSIS — A6004 Herpesviral vulvovaginitis: Secondary | ICD-10-CM

## 2023-07-13 DIAGNOSIS — Z0000 Encounter for general adult medical examination without abnormal findings: Secondary | ICD-10-CM

## 2023-07-22 LAB — HM MAMMOGRAPHY

## 2023-09-11 ENCOUNTER — Encounter: Payer: Self-pay | Admitting: Family Medicine

## 2023-09-12 MED ORDER — METRONIDAZOLE 500 MG PO TABS: 500.0000 mg | ORAL_TABLET | Freq: Two times a day (BID) | ORAL | 0 refills | Status: DC

## 2023-09-25 ENCOUNTER — Ambulatory Visit: Admitting: Physician Assistant

## 2023-09-25 ENCOUNTER — Encounter: Payer: Self-pay | Admitting: Physician Assistant

## 2023-09-25 VITALS — BP 134/85 | HR 85 | Temp 98.6°F | Ht 65.0 in | Wt 269.0 lb

## 2023-09-25 DIAGNOSIS — L299 Pruritus, unspecified: Secondary | ICD-10-CM | POA: Diagnosis not present

## 2023-09-25 DIAGNOSIS — B9689 Other specified bacterial agents as the cause of diseases classified elsewhere: Secondary | ICD-10-CM

## 2023-09-25 DIAGNOSIS — N760 Acute vaginitis: Secondary | ICD-10-CM

## 2023-09-25 LAB — POCT URINALYSIS DIP (CLINITEK)
Bilirubin, UA: NEGATIVE
Glucose, UA: NEGATIVE mg/dL
Ketones, POC UA: NEGATIVE mg/dL
Leukocytes, UA: NEGATIVE
Nitrite, UA: NEGATIVE
POC PROTEIN,UA: NEGATIVE
Spec Grav, UA: 1.025 (ref 1.010–1.025)
Urobilinogen, UA: 0.2 U/dL
pH, UA: 6 (ref 5.0–8.0)

## 2023-09-25 NOTE — Progress Notes (Signed)
 Established Patient Office Visit  Subjective   Patient ID: Meredith Barnes, female    DOB: 12-Dec-1975  Age: 48 y.o. MRN: [id number]  CC: vaginal irritation   HPI Patient is a 48 yo [place] American female who presents with a chief complaint of vaginal irritation. Denies any use of vaginal products or wipes. Denies itching, vaginal discharge, dysuria, urgency, frequency, or flank pain. Denies rashes. She has a history of bacterial vaginosis and vaginal irritation.   She also states she has specific areas on her back and buttock area that is itchy. It is worse after showering. She uses arm and hamor sensitive skin. She has not changed her detergents or soaps. She denies a rash.   .. Active Ambulatory Problems    Diagnosis Date Noted   ANEMIA, IRON DEFICIENCY, UNSPEC. 04/30/2006   Migraine without aura 05/13/2007   Asthma 06/20/2006   Hematuria 09/23/2006   OVARIAN CYST 02/07/2009   ECZEMA 06/20/2006   FATIGUE/MALAISE 04/30/2006   IFG (impaired fasting glucose) 05/27/2012   Tenosynovitis of foot and ankle 12/23/2013   Alopecia of scalp 02/01/2020   Flatulence 02/27/2021   History of partial colectomy 02/27/2021   Primary osteoarthritis of left knee 02/27/2021   Radiculitis of left cervical region 04/18/2021   Impingement syndrome, shoulder, right 05/30/2021   Irritant contact dermatitis due to cosmetics 12/18/2022   Seborrheic dermatitis 12/18/2022   Pruritus 09/27/2023   Bacterial vaginosis 09/27/2023   Resolved Ambulatory Problems    Diagnosis Date Noted   Acute upper respiratory infection 08/12/2009   BACK PAIN, THORACIC REGION 02/24/2009   SWELLING, NECK 03/11/2008   PELVIC  PAIN 10/18/2006   Abdominal tenderness, left lower quadrant 01/07/2009   Abnormal mammogram 10/18/2006   Edema 12/25/2013   Pain in lower limb 12/25/2013   Acute cough 02/14/2022   Bronchitis 02/14/2022   Past Medical History:  Diagnosis Date   Alopecia    Arthritis    Cyst, ovary,  follicular    Diverticulitis    Ectopic pregnancy    H. pylori infection    Hx of hysterectomy partial for cervicl CA 2003   Migraine headache    Migraines    Sigmoidoscopy exam partial sigmoid colectomy 07-23-08     Review of Systems  Genitourinary:        Vaginal irritation   Skin:  Positive for itching. Negative for rash.  All other systems reviewed and are negative.  Objective:    BP 134/85   Pulse 85   Temp 98.6 F (37 C) (Oral)   Ht 5\' 5"  (1.651 m)   Wt 269 lb (122 kg)   SpO2 99%   BMI 44.76 kg/m  BP Readings from Last 3 Encounters:  09/25/23 134/85  06/10/23 131/61  06/07/23 (!) 130/90   Wt Readings from Last 3 Encounters:  09/25/23 269 lb (122 kg)  06/10/23 270 lb (122.5 kg)  06/07/23 270 lb 4.8 oz (122.6 kg)   SpO2 Readings from Last 3 Encounters:  09/25/23 99%  06/10/23 98%  06/07/23 98%   Physical Exam Constitutional:      Appearance: She is obese.  HENT:     Head: Normocephalic and atraumatic.  Eyes:     Extraocular Movements: Extraocular movements intact.  Cardiovascular:     Rate and Rhythm: Normal rate and regular rhythm.     Pulses: Normal pulses.     Heart sounds: Normal heart sounds.  Pulmonary:     Effort: Pulmonary effort is normal.  Breath sounds: Normal breath sounds.  Musculoskeletal:        General: Normal range of motion.     Cervical back: Normal range of motion.  Skin:    General: Skin is warm.  Neurological:     Mental Status: She is alert.  Psychiatric:        Mood and Affect: Mood normal.        Behavior: Behavior normal.      Assessment & Plan:  Marland KitchenMarland KitchenMakibu was seen today for dysuria.  Diagnoses and all orders for this visit:  Bacterial vaginosis -     POCT URINALYSIS DIP (CLINITEK) -     Urine Culture -     WET PREP FOR TRICH, YEAST, CLUE  Pruritus   - Urinalysis w/ cx to confirm no infection - Wet prep for BV/Trich/yeast, will treat accordingly - Educated patient that BV is an overgrowth of normal  bacteria and potential causes of it. Discussed recurrent BV. - Recommend prevention of BV  - Vaginal pH strips OTC to see acidity   - Use of Boric acid OTC to keep vaginal pH acidic  - Educated patient on not using this everyday, using it at night - Start Zyrtec or Claritin daily to prevent itching - Avoid hot showers consider repeat CBC or even allergy testing if continues  [employer], [employer]

## 2023-09-25 NOTE — Progress Notes (Signed)
Please culture.

## 2023-09-25 NOTE — Patient Instructions (Signed)

## 2023-09-26 LAB — WET PREP FOR TRICH, YEAST, CLUE
Clue Cell Exam: NEGATIVE
Trichomonas Exam: NEGATIVE
Yeast Exam: NEGATIVE

## 2023-09-27 ENCOUNTER — Encounter: Payer: Self-pay | Admitting: Physician Assistant

## 2023-09-27 DIAGNOSIS — L299 Pruritus, unspecified: Secondary | ICD-10-CM | POA: Insufficient documentation

## 2023-09-27 DIAGNOSIS — B9689 Other specified bacterial agents as the cause of diseases classified elsewhere: Secondary | ICD-10-CM | POA: Insufficient documentation

## 2023-09-27 DIAGNOSIS — N760 Acute vaginitis: Secondary | ICD-10-CM | POA: Insufficient documentation

## 2023-09-27 LAB — URINE CULTURE

## 2023-09-27 NOTE — Progress Notes (Signed)
 No significant bacteria found on urine culture.  Your wet prep for BV/yeast/trich is negative!

## 2023-10-23 ENCOUNTER — Ambulatory Visit: Payer: No Typology Code available for payment source | Admitting: Family Medicine

## 2023-11-25 ENCOUNTER — Encounter: Payer: Self-pay | Admitting: Family Medicine

## 2023-11-25 ENCOUNTER — Ambulatory Visit (INDEPENDENT_AMBULATORY_CARE_PROVIDER_SITE_OTHER): Admitting: Family Medicine

## 2023-11-25 VITALS — BP 122/88 | HR 79 | Ht 65.0 in | Wt 271.0 lb

## 2023-11-25 DIAGNOSIS — R5383 Other fatigue: Secondary | ICD-10-CM

## 2023-11-25 DIAGNOSIS — E611 Iron deficiency: Secondary | ICD-10-CM

## 2023-11-25 DIAGNOSIS — D509 Iron deficiency anemia, unspecified: Secondary | ICD-10-CM | POA: Diagnosis not present

## 2023-11-25 DIAGNOSIS — E559 Vitamin D deficiency, unspecified: Secondary | ICD-10-CM | POA: Diagnosis not present

## 2023-11-25 DIAGNOSIS — R7301 Impaired fasting glucose: Secondary | ICD-10-CM

## 2023-11-25 LAB — POCT GLYCOSYLATED HEMOGLOBIN (HGB A1C): Hemoglobin A1C: 5.9 % — AB (ref 4.0–5.6)

## 2023-11-25 NOTE — Assessment & Plan Note (Signed)
 To recheck vitamin D levels.

## 2023-11-25 NOTE — Assessment & Plan Note (Signed)
 A1C is 5.9 which looks great!!!

## 2023-11-25 NOTE — Patient Instructions (Signed)
 Recommend a trial of zyrtec or claritin daily to see if helps with drainage and throat sensation.

## 2023-11-25 NOTE — Assessment & Plan Note (Signed)
 Has been more fatigued lately. Doesn't think had excessive bleeding during surgery.  Check iron levels.

## 2023-11-25 NOTE — Progress Notes (Signed)
 Established Patient Office Visit  Subjective  Patient ID: Meredith Barnes, female    DOB: 1976/06/14  Age: 48 y.o. MRN: [id number]  Chief Complaint  Patient presents with   Diabetes    HPI  Impaired fasting glucose-no increased thirst or urination. No symptoms consistent with hypoglycemia.  She reports that she is doing well overall with what she is eating and staying active.  She had a ventral hernia repair back in February.  She is still recovering.  She is started to feel really tired again.  She wonders if her vitamin D could be low again.  She is also over the last week or so had a little bit of some swelling or tightness sensation in her throat.  It has been several weeks since her surgery even though she was intubated it did not occur right after.  She has had little bit of postnasal drip and then today she has started developing a cough no fevers or chills or rash.  No urinary symptoms.feels fatigued as well.      ROS    Objective:     BP 122/88   Pulse 79   Ht 5\' 5"  (1.651 m)   Wt 271 lb (122.9 kg)   SpO2 100%   BMI 45.10 kg/m    Physical Exam Vitals and nursing note reviewed.  Constitutional:      Appearance: Normal appearance.  HENT:     Head: Normocephalic and atraumatic.     Right Ear: Tympanic membrane, ear canal and external ear normal. There is no impacted cerumen.     Left Ear: Tympanic membrane, ear canal and external ear normal. There is no impacted cerumen.     Nose: Nose normal.     Mouth/Throat:     Pharynx: Oropharynx is clear.  Eyes:     Conjunctiva/sclera: Conjunctivae normal.  Cardiovascular:     Rate and Rhythm: Normal rate and regular rhythm.  Pulmonary:     Effort: Pulmonary effort is normal.     Breath sounds: Normal breath sounds.  Musculoskeletal:     Cervical back: Neck supple. No tenderness.  Lymphadenopathy:     Cervical: No cervical adenopathy.  Skin:    General: Skin is warm and dry.  Neurological:     Mental  Status: She is alert and oriented to person, place, and time.  Psychiatric:        Mood and Affect: Mood normal.      Results for orders placed or performed in visit on 11/25/23  POCT HgB A1C  Result Value Ref Range   Hemoglobin A1C 5.9 (A) 4.0 - 5.6 %   HbA1c POC (<> result, manual entry)     HbA1c, POC (prediabetic range)     HbA1c, POC (controlled diabetic range)        The 10-year ASCVD risk score (Arnett DK, et al., 2019) is: 1.2%    Assessment & Plan:   Problem List Items Addressed This Visit       Endocrine   IFG (impaired fasting glucose) - Primary   A1C is 5.9 which looks great!!!       Relevant Orders   POCT HgB A1C (Completed)   CBC with Differential/Platelet   Iron, TIBC and Ferritin Panel   VITAMIN D 25 Hydroxy (Vit-D Deficiency, Fractures)   TSH     Other   Vitamin D deficiency   To recheck vitamin D levels.      Relevant Orders   VITAMIN D 25  Hydroxy (Vit-D Deficiency, Fractures)   ANEMIA, IRON DEFICIENCY, UNSPEC.   Has been more fatigued lately. Doesn't think had excessive bleeding during surgery.  Check iron levels.       Other Visit Diagnoses       Iron deficiency       Relevant Orders   CBC with Differential/Platelet   Iron, TIBC and Ferritin Panel   VITAMIN D 25 Hydroxy (Vit-D Deficiency, Fractures)   TSH     Other fatigue       Relevant Orders   CBC with Differential/Platelet   Iron, TIBC and Ferritin Panel   VITAMIN D 25 Hydroxy (Vit-D Deficiency, Fractures)   TSH      Throat tightness - Recommend a trial of zyrtec or claritin daily to see if helps with drainage and throat sensation.  She also started with a slight cough today so I do wonder if this could be more of an acute viral illness that seems to be progressing just encouraged her to continue with symptomatic care, salt water gargles, and if not improving this week please let me know.  No follow-ups on file.    Duaine German, MD

## 2023-11-26 ENCOUNTER — Encounter: Payer: Self-pay | Admitting: Family Medicine

## 2023-11-26 LAB — IRON,TIBC AND FERRITIN PANEL
Ferritin: 144 ng/mL (ref 15–150)
Iron Saturation: 12 % — ABNORMAL LOW (ref 15–55)
Iron: 38 ug/dL (ref 27–159)
Total Iron Binding Capacity: 316 ug/dL (ref 250–450)
UIBC: 278 ug/dL (ref 131–425)

## 2023-11-26 LAB — CBC WITH DIFFERENTIAL/PLATELET
Basophils Absolute: 0 10*3/uL (ref 0.0–0.2)
Basos: 0 %
EOS (ABSOLUTE): 0.3 10*3/uL (ref 0.0–0.4)
Eos: 4 %
Hematocrit: 35.8 % (ref 34.0–46.6)
Hemoglobin: 11.5 g/dL (ref 11.1–15.9)
Immature Grans (Abs): 0 10*3/uL (ref 0.0–0.1)
Immature Granulocytes: 0 %
Lymphocytes Absolute: 4.2 10*3/uL — ABNORMAL HIGH (ref 0.7–3.1)
Lymphs: 52 %
MCH: 24.2 pg — ABNORMAL LOW (ref 26.6–33.0)
MCHC: 32.1 g/dL (ref 31.5–35.7)
MCV: 75 fL — ABNORMAL LOW (ref 79–97)
Monocytes Absolute: 0.7 10*3/uL (ref 0.1–0.9)
Monocytes: 9 %
Neutrophils Absolute: 2.8 10*3/uL (ref 1.4–7.0)
Neutrophils: 35 %
Platelets: 301 10*3/uL (ref 150–450)
RBC: 4.76 x10E6/uL (ref 3.77–5.28)
RDW: 15.8 % — ABNORMAL HIGH (ref 11.7–15.4)
WBC: 8 10*3/uL (ref 3.4–10.8)

## 2023-11-26 LAB — VITAMIN D 25 HYDROXY (VIT D DEFICIENCY, FRACTURES): Vit D, 25-Hydroxy: 25.4 ng/mL — ABNORMAL LOW (ref 30.0–100.0)

## 2023-11-26 LAB — TSH: TSH: 1.87 u[IU]/mL (ref 0.450–4.500)

## 2023-11-26 NOTE — Progress Notes (Signed)
 Hi Deneka, your hemoglobin is stable.  Vitamin D is low. Make sure taking vitamin D 25mcg daily. If you are already taking Vit D, then take 50mcg daily.    Your iron is on the low end of normal. Are you still taking extra iron?  Your thyroid looks good.

## 2023-12-31 ENCOUNTER — Encounter: Payer: Self-pay | Admitting: Family Medicine

## 2023-12-31 ENCOUNTER — Ambulatory Visit (INDEPENDENT_AMBULATORY_CARE_PROVIDER_SITE_OTHER): Admitting: Family Medicine

## 2023-12-31 VITALS — BP 135/89 | HR 74 | Ht 65.0 in | Wt 270.0 lb

## 2023-12-31 DIAGNOSIS — L723 Sebaceous cyst: Secondary | ICD-10-CM

## 2023-12-31 DIAGNOSIS — L219 Seborrheic dermatitis, unspecified: Secondary | ICD-10-CM | POA: Diagnosis not present

## 2023-12-31 MED ORDER — KETOCONAZOLE 2 % EX CREA: 1.0000 | TOPICAL_CREAM | Freq: Every evening | CUTANEOUS | 0 refills | Status: DC

## 2023-12-31 MED ORDER — DOXYCYCLINE HYCLATE 100 MG PO TABS: 100.0000 mg | ORAL_TABLET | Freq: Two times a day (BID) | ORAL | 0 refills | Status: DC

## 2023-12-31 NOTE — Progress Notes (Signed)
    Acute Office Visit  Subjective:     Patient ID: Meredith Barnes, female    DOB: 05/13/1976, 48 y.o.   MRN: [id number]  Chief Complaint  Patient presents with   Vaginal Itching    HPI Patient is in today for lump on right labial present for > 1 week has been tender and sore. No drianage. A little smaller than it was. Thought initially HSV but not.    ROS      Objective:    BP 135/89   Pulse 74   Ht 5\' 5"  (1.651 m)   Wt 270 lb (122.5 kg)   BMI 44.93 kg/m    Physical Exam Skin:    Comments: On left labia there is an erythematous bump with no active drainage.  No other rash on the groin.  On the sides of the nose the skin is maculopapular with some dryness     No results found for any visits on 12/31/23.      Assessment & Plan:   Problem List Items Addressed This Visit       Musculoskeletal and Integument   Seborrheic dermatitis   Has tried lotrisone  and didn't help. More recently tried Elidel  without relief.  Will try ketoconazole  cream  If not helping can refer to Dermatology.        Other Visit Diagnoses       Inflamed sebaceous cyst    -  Primary   Relevant Medications   doxycycline  (VIBRA -TABS) 100 MG tablet       Meds ordered this encounter  Medications   doxycycline  (VIBRA -TABS) 100 MG tablet    Sig: Take 1 tablet (100 mg total) by mouth 2 (two) times daily.    Dispense:  14 tablet    Refill:  0   ketoconazole  (NIZORAL ) 2 % cream    Sig: Apply 1 Application topically at bedtime. To affected are on face    Dispense:  15 g    Refill:  0    No follow-ups on file.  Duaine German, MD

## 2023-12-31 NOTE — Assessment & Plan Note (Addendum)
 Has tried lotrisone  and didn't help. More recently tried Elidel  without relief.  Will try ketoconazole  cream  If not helping can refer to Dermatology.

## 2024-01-08 ENCOUNTER — Encounter: Payer: Self-pay | Admitting: Family Medicine

## 2024-01-08 MED ORDER — FLUCONAZOLE 150 MG PO TABS
150.0000 mg | ORAL_TABLET | Freq: Once | ORAL | 1 refills | Status: AC
Start: 2024-01-08 — End: 2024-01-08

## 2024-01-13 ENCOUNTER — Encounter: Payer: Self-pay | Admitting: Family Medicine

## 2024-01-13 MED ORDER — METRONIDAZOLE 500 MG PO TABS
500.0000 mg | ORAL_TABLET | Freq: Two times a day (BID) | ORAL | 0 refills | Status: DC
Start: 2024-01-13 — End: 2024-04-02

## 2024-01-13 NOTE — Addendum Note (Signed)
 Addended by: Cortez Flippen D on: 01/13/2024 09:26 PM   Modules accepted: Orders

## 2024-03-20 ENCOUNTER — Ambulatory Visit (INDEPENDENT_AMBULATORY_CARE_PROVIDER_SITE_OTHER): Admitting: Urgent Care

## 2024-03-20 ENCOUNTER — Encounter: Payer: Self-pay | Admitting: Urgent Care

## 2024-03-20 VITALS — BP 114/77 | HR 71 | Resp 18 | Ht 65.0 in | Wt 272.8 lb

## 2024-03-20 DIAGNOSIS — J302 Other seasonal allergic rhinitis: Secondary | ICD-10-CM

## 2024-03-20 DIAGNOSIS — J4521 Mild intermittent asthma with (acute) exacerbation: Secondary | ICD-10-CM

## 2024-03-20 DIAGNOSIS — J3489 Other specified disorders of nose and nasal sinuses: Secondary | ICD-10-CM

## 2024-03-20 DIAGNOSIS — L719 Rosacea, unspecified: Secondary | ICD-10-CM

## 2024-03-20 DIAGNOSIS — L219 Seborrheic dermatitis, unspecified: Secondary | ICD-10-CM | POA: Diagnosis not present

## 2024-03-20 MED ORDER — AIRSUPRA 90-80 MCG/ACT IN AERO: 2.0000 | INHALATION_SPRAY | Freq: Four times a day (QID) | RESPIRATORY_TRACT | 11 refills | Status: AC | PRN

## 2024-03-20 MED ORDER — KETOCONAZOLE 2 % EX SHAM: MEDICATED_SHAMPOO | CUTANEOUS | 11 refills | Status: AC

## 2024-03-20 MED ORDER — MONTELUKAST SODIUM 10 MG PO TABS: 10.0000 mg | ORAL_TABLET | Freq: Every day | ORAL | 3 refills | Status: AC

## 2024-03-20 MED ORDER — METRONIDAZOLE 0.75 % EX GEL: CUTANEOUS | 3 refills | Status: AC

## 2024-03-20 NOTE — Patient Instructions (Signed)
 Discontinue albuterol  - switch to Airsupra  instead. Use 1-2 puffs as needed, up to 12 puffs daily. Rinse your mouth out after use.  Take one tab of singulair  nightly. Consider also purchasing xyzal OTC.  For your nose bleed - look for rhinaris or nasogel OTC. This is a gel to help with dry noses  For your external nose - use ketoconazole  shampoo. Lather, leave on 5 min then rinse. Do this 2x/ week. Also apply topical metronidazole  gel twice daily.  Follow up with any new or worsening symptoms.

## 2024-03-20 NOTE — Progress Notes (Signed)
 Established Patient Office Visit  Subjective:  Patient ID: Meredith Barnes, female    DOB: March 29, 1976  Age: 48 y.o. MRN: [id number]  Chief Complaint  Patient presents with   Cough    Exacerbated by smoke, or outdoor pollen\. Pt also mentioned she was prescribed some creme for her dry skin around her nose that isn't helping    Pt presents today with c/o 2 week hx of recurrent cough. She has hx of asthma, reports current cough is worse around smoke and outdoor allergens. Has script for albuterol , uses PRN. Normally does not require, but in the past 2 weeks has required more often. Cough is non-productive. No Additional uRI sx, no fever or chills. No OTC treatments tried. Has not required albuterol  today.  Additionally, pt c/o dark scaling rash to B external nares, extending to labial folds. Has been using topical nizoral  cream, pt uncertain of efficacy.  Pt c/o intermittent nose bleed to L nare.    Patient Active Problem List   Diagnosis Date Noted   Vitamin D  deficiency 11/25/2023   Pruritus 09/27/2023   Bacterial vaginosis 09/27/2023   Irritant contact dermatitis due to cosmetics 12/18/2022   Seborrheic dermatitis 12/18/2022   Impingement syndrome, shoulder, right 05/30/2021   Radiculitis of left cervical region 04/18/2021   Flatulence 02/27/2021   History of partial colectomy 02/27/2021   Primary osteoarthritis of left knee 02/27/2021   Alopecia of scalp 02/01/2020   Tenosynovitis of foot and ankle 12/23/2013   IFG (impaired fasting glucose) 05/27/2012   OVARIAN CYST 02/07/2009   Migraine without aura 05/13/2007   Hematuria 09/23/2006   Asthma 06/20/2006   ECZEMA 06/20/2006   ANEMIA, IRON DEFICIENCY, UNSPEC. 04/30/2006   FATIGUE/MALAISE 04/30/2006   Past Medical History:  Diagnosis Date   Alopecia    Arthritis    Asthma    Cyst, ovary, follicular    Diverticulitis    Ectopic pregnancy    H. pylori infection    Hx of hysterectomy partial for cervicl CA 2003    Migraine headache    Migraines    Sigmoidoscopy exam partial sigmoid colectomy 07-23-08   Past Surgical History:  Procedure Laterality Date   ABDOMINAL HYSTERECTOMY     ECTOPIC PREGNANCY SURGERY     FOOT SURGERY     HERNIA REPAIR     OOPHORECTOMY     partial sigmoid colectomy 07-23-08  07-23-08   TOTAL VAGINAL HYSTERECTOMY  2003   Cervical Cancer   Social History   Tobacco Use   Smoking status: Never   Smokeless tobacco: Never  Vaping Use   Vaping status: Never Used  Substance Use Topics   Alcohol use: No   Drug use: No      ROS: as noted in HPI  Objective:     BP 114/77   Pulse 71   Resp 18   Ht 5' 5 (1.651 m)   Wt 272 lb 12 oz (123.7 kg)   SpO2 99%   BMI 45.39 kg/m  BP Readings from Last 3 Encounters:  03/20/24 114/77  12/31/23 135/89  11/25/23 122/88   Wt Readings from Last 3 Encounters:  03/20/24 272 lb 12 oz (123.7 kg)  12/31/23 270 lb (122.5 kg)  11/25/23 271 lb (122.9 kg)      Physical Exam Vitals and nursing note reviewed.  Constitutional:      General: She is not in acute distress.    Appearance: Normal appearance. She is not ill-appearing, toxic-appearing or diaphoretic.  HENT:  Head: Normocephalic and atraumatic.     Salivary Glands: Right salivary gland is not diffusely enlarged or tender. Left salivary gland is not diffusely enlarged or tender.     Right Ear: Tympanic membrane, ear canal and external ear normal. No swelling or tenderness. No middle ear effusion. There is no impacted cerumen. Tympanic membrane is not injected, scarred, perforated or erythematous.     Left Ear: Tympanic membrane, ear canal and external ear normal. No tenderness.  No middle ear effusion. There is no impacted cerumen. Tympanic membrane is not injected, scarred, perforated or erythematous.     Nose: Nose normal.     Right Turbinates: Not enlarged or swollen.     Left Turbinates: Not enlarged, swollen or pale.     Right Sinus: No maxillary sinus tenderness or  frontal sinus tenderness.     Left Sinus: No maxillary sinus tenderness or frontal sinus tenderness.     Comments: No evidence of recent bleed    Mouth/Throat:     Lips: Pink.     Mouth: Mucous membranes are moist.     Pharynx: Oropharynx is clear. Uvula midline. No pharyngeal swelling, oropharyngeal exudate, posterior oropharyngeal erythema or uvula swelling.  Cardiovascular:     Rate and Rhythm: Normal rate and regular rhythm.     Pulses: Normal pulses.     Heart sounds: Normal heart sounds. No murmur heard.    No friction rub. No gallop.  Pulmonary:     Effort: Pulmonary effort is normal. No respiratory distress.     Breath sounds: No stridor. Wheezing (scant wheezes posterior lung fields) present. No rhonchi or rales.  Chest:     Chest wall: No tenderness.  Skin:    General: Skin is warm and dry.     Findings: Rash (seborrheic dermatitis noted to nasolabial folds with concurrent rosacea papules to nasal bridge/ malar distribution) present.  Neurological:     Mental Status: She is alert.      No results found for any visits on 03/20/24.  Last CBC Lab Results  Component Value Date   WBC 8.0 11/25/2023   HGB 11.5 11/25/2023   HCT 35.8 11/25/2023   MCV 75 (L) 11/25/2023   MCH 24.2 (L) 11/25/2023   RDW 15.8 (H) 11/25/2023   PLT 301 11/25/2023      The 10-year ASCVD risk score (Arnett DK, et al., 2019) is: 0.9%  Assessment & Plan:  Mild intermittent asthma with acute exacerbation -     Airsupra ; Inhale 2 puffs into the lungs every 6 (six) hours as needed. Rinse mouth after use  Dispense: 1 g; Refill: 11 -     Montelukast  Sodium; Take 1 tablet (10 mg total) by mouth at bedtime.  Dispense: 30 tablet; Refill: 3  Seasonal allergies  Dry nose  Seborrheic dermatitis -     Ketoconazole ; Apply to nose/ face twice a week for 8 weeks and then weekly thereafter. Lather, leave on for 5 min prior to rinsing  Dispense: 120 mL; Refill: 11  Rosacea -     metroNIDAZOLE ; 1  application to affected areas of face up to twice daily  Dispense: 45 g; Refill: 3  Stop albuterol , switch to Airsupra . Asthma appears to be allergically triggered, so will start singulair  nightly.  For nasal rash, will switch to ketoconazole  shampoo for twice weekly administration. Suspect concurrent rosacea, trial of topical metronidazole  gel. For nose, OTC nasogel to prevent dryness/ bleeding.   No follow-ups on file.   Makeyla Govan L Christiaan Strebeck,  PA

## 2024-03-24 ENCOUNTER — Encounter: Payer: Self-pay | Admitting: Sports Medicine

## 2024-04-02 ENCOUNTER — Encounter: Payer: Self-pay | Admitting: Family Medicine

## 2024-04-02 ENCOUNTER — Ambulatory Visit (INDEPENDENT_AMBULATORY_CARE_PROVIDER_SITE_OTHER): Admitting: Family Medicine

## 2024-04-02 VITALS — BP 134/82 | HR 96 | Ht 65.0 in | Wt 272.0 lb

## 2024-04-02 DIAGNOSIS — L219 Seborrheic dermatitis, unspecified: Secondary | ICD-10-CM | POA: Diagnosis not present

## 2024-04-02 DIAGNOSIS — R7301 Impaired fasting glucose: Secondary | ICD-10-CM | POA: Diagnosis not present

## 2024-04-02 DIAGNOSIS — Z23 Encounter for immunization: Secondary | ICD-10-CM | POA: Diagnosis not present

## 2024-04-02 DIAGNOSIS — E559 Vitamin D deficiency, unspecified: Secondary | ICD-10-CM

## 2024-04-02 DIAGNOSIS — Z0000 Encounter for general adult medical examination without abnormal findings: Secondary | ICD-10-CM

## 2024-04-02 DIAGNOSIS — A6004 Herpesviral vulvovaginitis: Secondary | ICD-10-CM

## 2024-04-02 MED ORDER — HYDROCORTISONE VALERATE 0.2 % EX CREA: 1.0000 | TOPICAL_CREAM | Freq: Every morning | CUTANEOUS | 0 refills | Status: AC

## 2024-04-02 MED ORDER — VALACYCLOVIR HCL 500 MG PO TABS
500.0000 mg | ORAL_TABLET | Freq: Two times a day (BID) | ORAL | 5 refills | Status: AC | PRN
Start: 2024-04-02 — End: ?

## 2024-04-02 NOTE — Patient Instructions (Addendum)
 Can try DEBROX drops for your ear wax on the right ear.    Keep up a regular exercise program and make sure you are eating a healthy diet Try to eat 4 servings of dairy a day, or if you are lactose intolerant take a calcium  with vitamin D  daily.  Your vaccines are up to date.    Most importantly: HAPPY HAPPY BIRTHDAY !!!!!!!!!!!!!!!!!!

## 2024-04-02 NOTE — Progress Notes (Signed)
 Complete physical exam  Patient: Meredith Barnes   DOB: [birth year]   48 y.o. Female  MRN: [id number]  Subjective:    Chief Complaint  Patient presents with   Annual Exam    Meredith Barnes is a 48 y.o. female who presents today for a complete physical exam. She reports consuming a general diet. The patient does not participate in regular exercise at present. She generally feels well. She reports sleeping well. She does not have additional problems to discuss today.    Most recent fall risk assessment:    04/02/2024    4:15 PM  Fall Risk   Falls in the past year? 0  Number falls in past yr: 0  Injury with Fall? 0  Risk for fall due to : No Fall Risks  Follow up Falls evaluation completed     Most recent depression screenings:    04/02/2024    4:13 PM 03/20/2024   10:10 AM  PHQ 2/9 Scores  PHQ - 2 Score 0 0         Patient Care Team: Alvan Dorothyann BIRCH, MD as PCP - General (Family Medicine) Porter Andrez SAUNDERS, PA-C (Inactive) as Physician Assistant (Dermatology)   Outpatient Medications Prior to Visit  Medication Sig   Albuterol -Budesonide (AIRSUPRA ) 90-80 MCG/ACT AERO Inhale 2 puffs into the lungs every 6 (six) hours as needed. Rinse mouth after use   ketoconazole  (NIZORAL ) 2 % shampoo Apply to nose/ face twice a week for 8 weeks and then weekly thereafter. Lather, leave on for 5 min prior to rinsing   metroNIDAZOLE  (METROGEL ) 0.75 % gel 1 application to affected areas of face up to twice daily   montelukast  (SINGULAIR ) 10 MG tablet Take 1 tablet (10 mg total) by mouth at bedtime.   [DISCONTINUED] doxycycline  (VIBRA -TABS) 100 MG tablet Take 1 tablet (100 mg total) by mouth 2 (two) times daily.   [DISCONTINUED] metroNIDAZOLE  (FLAGYL ) 500 MG tablet Take 1 tablet (500 mg total) by mouth 2 (two) times daily.   [DISCONTINUED] valACYclovir  (VALTREX ) 500 MG tablet Take 1 tablet (500 mg total) by mouth 2 (two) times daily as needed. For 3 to 5 days at the first  sign of outbreak   No facility-administered medications prior to visit.    ROS        Objective:     BP 134/82   Pulse 96   Ht 5' 5 (1.651 m)   Wt 272 lb (123.4 kg)   BMI 45.26 kg/m     Physical Exam Constitutional:      Appearance: Normal appearance.  HENT:     Head: Normocephalic and atraumatic.     Right Ear: Tympanic membrane, ear canal and external ear normal.     Left Ear: Tympanic membrane, ear canal and external ear normal.     Nose: Nose normal.     Mouth/Throat:     Pharynx: Oropharynx is clear.  Eyes:     Extraocular Movements: Extraocular movements intact.     Conjunctiva/sclera: Conjunctivae normal.     Pupils: Pupils are equal, round, and reactive to light.  Neck:     Thyroid: No thyromegaly.  Cardiovascular:     Rate and Rhythm: Normal rate and regular rhythm.  Pulmonary:     Effort: Pulmonary effort is normal.     Breath sounds: Normal breath sounds.  Abdominal:     General: Bowel sounds are normal.     Palpations: Abdomen is soft.     Tenderness: There  is no abdominal tenderness.  Musculoskeletal:        General: No swelling.     Cervical back: Neck supple.  Skin:    General: Skin is warm and dry.  Neurological:     Mental Status: She is oriented to person, place, and time.  Psychiatric:        Mood and Affect: Mood normal.        Behavior: Behavior normal.      No results found for any visits on 04/02/24.      Assessment & Plan:    Routine Health Maintenance and Physical Exam  Immunization History  Administered Date(s) Administered   Influenza Whole 04/22/2006   Influenza, Seasonal, Injecte, Preservative Fre 04/02/2024   PFIZER(Purple Top)SARS-COV-2 Vaccination 10/31/2019, 11/27/2019, 08/01/2020   Tdap 07/03/2019    Health Maintenance  Topic Date Due   Hepatitis B Vaccines 19-59 Average Risk (1 of 3 - 19+ 3-dose series) Never done   COVID-19 Vaccine (4 - 2025-26 season) 03/23/2024   Pneumococcal Vaccine (1 of 2 - PCV)  07/13/2026 (Originally 03/26/1996)   Colonoscopy  08/17/2024   Mammogram  07/21/2025   DTaP/Tdap/Td (2 - Td or Tdap) 07/02/2029   Influenza Vaccine  Completed   Hepatitis C Screening  Completed   HIV Screening  Completed   HPV VACCINES  Aged Out   Meningococcal B Vaccine  Aged Out    Discussed health benefits of physical activity, and encouraged her to engage in regular exercise appropriate for her age and condition.  Problem List Items Addressed This Visit       Endocrine   IFG (impaired fasting glucose)   Relevant Orders   CMP14+EGFR   Lipid Panel With LDL/HDL Ratio   VITAMIN D  25 Hydroxy (Vit-D Deficiency, Fractures)   Hemoglobin A1c   Iron, TIBC and Ferritin Panel   B12 and Folate Panel   CBC with Differential/Platelet     Musculoskeletal and Integument   Seborrheic dermatitis   Relevant Medications   hydrocortisone  valerate cream (WESTCORT ) 0.2 %     Other   Vitamin D  deficiency   Relevant Orders   CMP14+EGFR   Lipid Panel With LDL/HDL Ratio   VITAMIN D  25 Hydroxy (Vit-D Deficiency, Fractures)   Hemoglobin A1c   Iron, TIBC and Ferritin Panel   B12 and Folate Panel   CBC with Differential/Platelet   Other Visit Diagnoses       Wellness examination    -  Primary   Relevant Medications   valACYclovir  (VALTREX ) 500 MG tablet   Other Relevant Orders   CMP14+EGFR   Lipid Panel With LDL/HDL Ratio   VITAMIN D  25 Hydroxy (Vit-D Deficiency, Fractures)   Hemoglobin A1c   Iron, TIBC and Ferritin Panel   B12 and Folate Panel   CBC with Differential/Platelet     Herpes simplex vulvovaginitis       Relevant Medications   valACYclovir  (VALTREX ) 500 MG tablet     Encounter for immunization       Relevant Orders   Flu vaccine trivalent PF, 6mos and older(Flulaval,Afluria,Fluarix,Fluzone) (Completed)       Keep up a regular exercise program and make sure you are eating a healthy diet Try to eat 4 servings of dairy a day, or if you are lactose intolerant take a  calcium  with vitamin D  daily.  Your vaccines are up to date.   Return in about 1 year (around 04/02/2025) for Wellness Exam.     Dorothyann Byars, MD

## 2024-04-04 LAB — LIPID PANEL WITH LDL/HDL RATIO
Cholesterol, Total: 229 mg/dL — ABNORMAL HIGH (ref 100–199)
HDL: 47 mg/dL (ref >39)
LDL Chol Calc (NIH): 166 mg/dL — ABNORMAL HIGH (ref 0–99)
LDL/HDL Ratio: 3.5 ratio — ABNORMAL HIGH (ref 0.0–3.2)
Triglycerides: 92 mg/dL (ref 0–149)
VLDL Cholesterol Cal: 16 mg/dL (ref 5–40)

## 2024-04-04 LAB — CMP14+EGFR
ALT: 14 IU/L (ref 0–32)
AST: 13 IU/L (ref 0–40)
Albumin: 4.4 g/dL (ref 3.9–4.9)
Alkaline Phosphatase: 96 IU/L (ref 44–121)
BUN/Creatinine Ratio: 14 (ref 9–23)
BUN: 11 mg/dL (ref 6–24)
Bilirubin Total: 0.2 mg/dL (ref 0.0–1.2)
CO2: 23 mmol/L (ref 20–29)
Calcium: 9.7 mg/dL (ref 8.7–10.2)
Chloride: 107 mmol/L — ABNORMAL HIGH (ref 96–106)
Creatinine, Ser: 0.76 mg/dL (ref 0.57–1.00)
Globulin, Total: 3 g/dL (ref 1.5–4.5)
Glucose: 105 mg/dL — ABNORMAL HIGH (ref 70–99)
Potassium: 4.5 mmol/L (ref 3.5–5.2)
Sodium: 146 mmol/L — ABNORMAL HIGH (ref 134–144)
Total Protein: 7.4 g/dL (ref 6.0–8.5)
eGFR: 97 mL/min/1.73 (ref >59)

## 2024-04-04 LAB — CBC WITH DIFFERENTIAL/PLATELET
Basophils Absolute: 0 x10E3/uL (ref 0.0–0.2)
Basos: 0 %
EOS (ABSOLUTE): 0.1 x10E3/uL (ref 0.0–0.4)
Eos: 2 %
Hematocrit: 38.4 % (ref 34.0–46.6)
Hemoglobin: 12.1 g/dL (ref 11.1–15.9)
Immature Grans (Abs): 0 x10E3/uL (ref 0.0–0.1)
Immature Granulocytes: 0 %
Lymphocytes Absolute: 3.2 x10E3/uL — ABNORMAL HIGH (ref 0.7–3.1)
Lymphs: 48 %
MCH: 24 pg — ABNORMAL LOW (ref 26.6–33.0)
MCHC: 31.5 g/dL (ref 31.5–35.7)
MCV: 76 fL — ABNORMAL LOW (ref 79–97)
Monocytes Absolute: 0.5 x10E3/uL (ref 0.1–0.9)
Monocytes: 7 %
Neutrophils Absolute: 3 x10E3/uL (ref 1.4–7.0)
Neutrophils: 43 %
Platelets: 303 x10E3/uL (ref 150–450)
RBC: 5.04 x10E6/uL (ref 3.77–5.28)
RDW: 16.4 % — ABNORMAL HIGH (ref 11.7–15.4)
WBC: 6.9 x10E3/uL (ref 3.4–10.8)

## 2024-04-04 LAB — IRON,TIBC AND FERRITIN PANEL
Ferritin: 121 ng/mL (ref 15–150)
Iron Saturation: 14 % — ABNORMAL LOW (ref 15–55)
Iron: 43 ug/dL (ref 27–159)
Total Iron Binding Capacity: 312 ug/dL (ref 250–450)
UIBC: 269 ug/dL (ref 131–425)

## 2024-04-04 LAB — VITAMIN D 25 HYDROXY (VIT D DEFICIENCY, FRACTURES): Vit D, 25-Hydroxy: 22.2 ng/mL — ABNORMAL LOW (ref 30.0–100.0)

## 2024-04-04 LAB — B12 AND FOLATE PANEL
Folate: 7.5 ng/mL (ref >3.0)
Vitamin B-12: 342 pg/mL (ref 232–1245)

## 2024-04-04 LAB — HEMOGLOBIN A1C
Est. average glucose Bld gHb Est-mCnc: 131 mg/dL
Hgb A1c MFr Bld: 6.2 % — ABNORMAL HIGH (ref 4.8–5.6)

## 2024-04-07 ENCOUNTER — Ambulatory Visit: Payer: Self-pay | Admitting: Family Medicine

## 2024-04-07 NOTE — Progress Notes (Signed)
 HI Meredith Barnes,  Sodium level for some reason was just slightly elevated normally around 144 this time it was 146 just encouraged her to cut back on sodium intake.  Liver enzymes are normal.  Total cholesterol and LDL are also up.  Continue to work on healthy diet and regular exercise.  Vitamin D  is low please make sure you are taking 25 mcg daily if you are already taking that amount then increase to 50 mcg daily.  The A1c was a little up this time at 6.2 it jumped up a little bit compared to last time when it was 5.9 continue to work on cutting back on sweets and carbohydrates and starches.  Iron levels look okay.  B12 and folate are normal.  The 10-year ASCVD risk score (Arnett DK, et al., 2019) is: 2.5%   Values used to calculate the score:     Age: 48 years     Clincally relevant sex: Female     Is Non-Hispanic African American: Yes     Diabetic: No     Tobacco smoker: No     Systolic Blood Pressure: 134 mmHg     Is BP treated: No     HDL Cholesterol: 47 mg/dL     Total Cholesterol: 229 mg/dL

## 2024-04-13 ENCOUNTER — Ambulatory Visit (INDEPENDENT_AMBULATORY_CARE_PROVIDER_SITE_OTHER): Admitting: Family Medicine

## 2024-04-13 ENCOUNTER — Encounter: Payer: Self-pay | Admitting: Family Medicine

## 2024-04-13 VITALS — BP 130/87 | HR 84 | Temp 98.7°F | Ht 65.0 in | Wt 272.0 lb

## 2024-04-13 DIAGNOSIS — J029 Acute pharyngitis, unspecified: Secondary | ICD-10-CM

## 2024-04-13 DIAGNOSIS — R059 Cough, unspecified: Secondary | ICD-10-CM | POA: Diagnosis not present

## 2024-04-13 DIAGNOSIS — J069 Acute upper respiratory infection, unspecified: Secondary | ICD-10-CM

## 2024-04-13 LAB — POCT RAPID STREP A (OFFICE): Rapid Strep A Screen: NEGATIVE

## 2024-04-13 MED ORDER — PREDNISONE 20 MG PO TABS: 40.0000 mg | ORAL_TABLET | Freq: Every day | ORAL | 0 refills | Status: DC

## 2024-04-13 MED ORDER — BUDESONIDE-FORMOTEROL FUMARATE 160-4.5 MCG/ACT IN AERO: 2.0000 | INHALATION_SPRAY | Freq: Two times a day (BID) | RESPIRATORY_TRACT | 3 refills | Status: DC

## 2024-04-13 MED ORDER — BENZONATATE 200 MG PO CAPS: 200.0000 mg | ORAL_CAPSULE | Freq: Three times a day (TID) | ORAL | 0 refills | Status: DC | PRN

## 2024-04-13 MED ORDER — HYDROCODONE BIT-HOMATROP MBR 5-1.5 MG/5ML PO SOLN: 5.0000 mL | Freq: Three times a day (TID) | ORAL | 0 refills | Status: DC | PRN

## 2024-04-13 NOTE — Progress Notes (Signed)
 Acute Office Visit  Subjective:     Patient ID: Meredith Barnes, female    DOB: May 21, 1976, 48 y.o.   MRN: [id number]  Chief Complaint  Patient presents with   Sore Throat    HPI Patient is in today for ST, cough, and SOB. Had to sleep on her back.  No fever or chills.  Home COVID test was negative.  Throat is burning.  + bodyaches today.  No mucous. Some nasal drainage.   ROS      Objective:    BP 130/87   Pulse 84   Temp 98.7 F (37.1 C) (Oral)   Ht 5' 5 (1.651 m)   Wt 272 lb (123.4 kg)   SpO2 100%   BMI 45.26 kg/m    Physical Exam Constitutional:      Appearance: Normal appearance.  HENT:     Head: Normocephalic and atraumatic.     Right Ear: Ear canal and external ear normal. There is no impacted cerumen.     Left Ear: Tympanic membrane, ear canal and external ear normal. There is no impacted cerumen.     Ears:     Comments: Bubbls behind the right TM    Nose: Nose normal.     Mouth/Throat:     Pharynx: Oropharynx is clear.  Eyes:     Conjunctiva/sclera: Conjunctivae normal.  Cardiovascular:     Rate and Rhythm: Normal rate and regular rhythm.  Pulmonary:     Effort: Pulmonary effort is normal.     Breath sounds: Normal breath sounds.  Musculoskeletal:     Cervical back: Neck supple. No tenderness.  Lymphadenopathy:     Cervical: No cervical adenopathy.  Skin:    General: Skin is warm and dry.  Neurological:     Mental Status: She is alert and oriented to person, place, and time.  Psychiatric:        Mood and Affect: Mood normal.     Results for orders placed or performed in visit on 04/13/24  POCT rapid strep A  Result Value Ref Range   Rapid Strep A Screen Negative Negative        Assessment & Plan:   Problem List Items Addressed This Visit   None Visit Diagnoses       Sore throat    -  Primary   Relevant Orders   POCT rapid strep A (Completed)     Upper respiratory tract infection, unspecified type         Cough,  unspecified type       Relevant Medications   benzonatate  (TESSALON ) 200 MG capsule       Meds ordered this encounter  Medications   predniSONE  (DELTASONE ) 20 MG tablet    Sig: Take 2 tablets (40 mg total) by mouth daily with breakfast.    Dispense:  10 tablet    Refill:  0   budesonide -formoterol  (SYMBICORT ) 160-4.5 MCG/ACT inhaler    Sig: Inhale 2 puffs into the lungs 2 (two) times daily.    Dispense:  1 each    Refill:  3   HYDROcodone  bit-homatropine (HYCODAN) 5-1.5 MG/5ML syrup    Sig: Take 5 mLs by mouth every 8 (eight) hours as needed for cough.    Dispense:  60 mL    Refill:  0   benzonatate  (TESSALON ) 200 MG capsule    Sig: Take 1 capsule (200 mg total) by mouth 3 (three) times daily as needed for cough.    Dispense:  30 capsule    Refill:  0    No follow-ups on file.  Dorothyann Byars, MD

## 2024-04-19 ENCOUNTER — Encounter: Payer: Self-pay | Admitting: Family Medicine

## 2024-04-20 MED ORDER — PREDNISONE 20 MG PO TABS: 40.0000 mg | ORAL_TABLET | Freq: Every day | ORAL | 0 refills | Status: DC

## 2024-04-20 MED ORDER — AMOXICILLIN 875 MG PO TABS: 875.0000 mg | ORAL_TABLET | Freq: Two times a day (BID) | ORAL | 0 refills | Status: DC

## 2024-04-20 NOTE — Telephone Encounter (Signed)
 It sounds like maybe start of sinus infection antibiotic should help

## 2024-04-21 NOTE — Telephone Encounter (Signed)
 Last read by Synetta Rojean Bergeron at 9:48AM on 04/21/2024.

## 2024-04-27 ENCOUNTER — Encounter: Payer: Self-pay | Admitting: Family Medicine

## 2024-04-27 DIAGNOSIS — R059 Cough, unspecified: Secondary | ICD-10-CM

## 2024-04-27 DIAGNOSIS — J4520 Mild intermittent asthma, uncomplicated: Secondary | ICD-10-CM

## 2024-04-28 NOTE — Telephone Encounter (Signed)
 If using symbicort  BID and singular and still with wheezing rec CXR.

## 2024-04-29 ENCOUNTER — Ambulatory Visit (INDEPENDENT_AMBULATORY_CARE_PROVIDER_SITE_OTHER)

## 2024-04-29 DIAGNOSIS — R059 Cough, unspecified: Secondary | ICD-10-CM | POA: Diagnosis not present

## 2024-04-29 DIAGNOSIS — R062 Wheezing: Secondary | ICD-10-CM | POA: Diagnosis not present

## 2024-04-29 DIAGNOSIS — R0602 Shortness of breath: Secondary | ICD-10-CM | POA: Diagnosis not present

## 2024-04-29 DIAGNOSIS — J4520 Mild intermittent asthma, uncomplicated: Secondary | ICD-10-CM

## 2024-05-04 ENCOUNTER — Ambulatory Visit: Payer: Self-pay | Admitting: Family Medicine

## 2024-05-04 ENCOUNTER — Telehealth: Payer: Self-pay | Admitting: Family Medicine

## 2024-05-04 DIAGNOSIS — R059 Cough, unspecified: Secondary | ICD-10-CM

## 2024-05-04 DIAGNOSIS — R0602 Shortness of breath: Secondary | ICD-10-CM

## 2024-05-04 MED ORDER — PREDNISONE 20 MG PO TABS: 40.0000 mg | ORAL_TABLET | Freq: Every day | ORAL | 0 refills | Status: DC

## 2024-05-04 NOTE — Telephone Encounter (Signed)
MyChart message also sent to the pt.

## 2024-05-04 NOTE — Telephone Encounter (Signed)
 Chest xray looks reassuring. No sign of pneumonia. I did send refill on predniisone. Does she need refill on cough syrup.  Does she like cough is getting more phlegm after weekend or still dy

## 2024-05-04 NOTE — Progress Notes (Signed)
 Your lab work is within acceptable range and there are no concerning findings.   ?

## 2024-05-05 ENCOUNTER — Ambulatory Visit (INDEPENDENT_AMBULATORY_CARE_PROVIDER_SITE_OTHER): Admitting: Pulmonary Disease

## 2024-05-05 ENCOUNTER — Encounter: Payer: Self-pay | Admitting: Pulmonary Disease

## 2024-05-05 VITALS — BP 120/84 | HR 74 | Ht 65.0 in | Wt 278.4 lb

## 2024-05-05 DIAGNOSIS — J4520 Mild intermittent asthma, uncomplicated: Secondary | ICD-10-CM | POA: Diagnosis not present

## 2024-05-05 DIAGNOSIS — J40 Bronchitis, not specified as acute or chronic: Secondary | ICD-10-CM

## 2024-05-05 MED ORDER — BREZTRI AEROSPHERE 160-9-4.8 MCG/ACT IN AERO
INHALATION_SPRAY | RESPIRATORY_TRACT | Status: AC
Start: 2024-05-05 — End: ?

## 2024-05-05 MED ORDER — PREDNISONE 20 MG PO TABS: ORAL_TABLET | ORAL | 0 refills | Status: AC

## 2024-05-05 NOTE — Patient Instructions (Addendum)
 Nice to meet you  I think a viral infection start up a lot of inflammation and while the virus is probably gone the inflammation has not calm down  I think the prednisone  antibiotics and the inhalers are all good choices, unfortunately if not improved your symptoms  Take prednisone  40 mg for 5 days and 20 mg for 5 days then stop  Take Breztri 2 puffs in the morning, 2 puffs in the evening, rinse your mouth out with water after use.  This will replace Symbicort , stop Symbicort  while using Breztri.  Use the Breztri for 2 weeks, if it is particularly beneficial let me know and I can prescribe it.  If symptoms have improved you can return to using Symbicort .  Return to clinic in 4 weeks with Dr. Annella or APP

## 2024-05-05 NOTE — Progress Notes (Signed)
 @Patient  ID: Meredith Barnes, female    DOB: 04/11/1976, 48 y.o.   MRN: [id number]  Chief Complaint  Patient presents with   Consult    Pt states  x1 month SOB, dry cough, wheezing worse while lying down and tightness     Referring provider: Alvan Dorothyann BIRCH, *  HPI:   48 y.o. woman whom are seen for evaluation of 1 month of dry cough and wheezing, shortness of breath.  Multiple PCP notes reviewed.  Patient became ill around 09 April 2024.  Cough, chest congestion.  Denies runny nose or rhinorrhea or sinus congestion.  Cough is lingered since then.  Now dry.  Triggered by irritants such as smoke.  Temperature changes, going outside.  Sounds like bronchospasm.  Coughing fits.  Then resolves for a brief period of time.  Was treated with amoxicillin  and prednisone  initially.  No real improvement.  Prescribed Symbicort .  No real improvement.  Albuterol  does not seem to help much either.  Had chest x-ray performed 04/29/2024, on my review interpretation this is clear, potential mild hyperinflation on the lateral with increased lucency between sternum and heart.  Recent labs without significant eosinophils, absolute value 100.  11/2023 CBC reveals eosinophils of 300.  She reports bronchitis about once a year or once every year.  Symptoms not last this long.  Discussed at length likely prolonged bronchitis triggered by viral infection.  Suspect this will get better with time sometimes time is only thing that will make it better.  Discussed role and rationale for additional treatment to see if we can speed up timeliness of improvement.    Questionaires / Pulmonary Flowsheets:   ACT:      No data to display          MMRC:     No data to display          Epworth:      No data to display          Tests:   FENO:  No results found for: NITRICOXIDE  PFT:     No data to display          WALK:      No data to display          Imaging: Personally  reviewed and as per EMR and discussion in this note DG Chest 2 View Result Date: 05/03/2024 CLINICAL DATA:  Cough, wheezing, shortness of breath. EXAM: CHEST - 2 VIEW COMPARISON:  08/15/2022 FINDINGS: The cardiomediastinal contours are normal. The lungs are clear. Pulmonary vasculature is normal. No consolidation, pleural effusion, or pneumothorax. No acute osseous abnormalities are seen. IMPRESSION: No active cardiopulmonary disease. Electronically Signed   By: Andrea Gasman M.D.   On: 05/03/2024 15:41    Lab Results: Personally reviewed CBC    Component Value Date/Time   WBC 6.9 04/03/2024 0811   WBC 8.2 12/21/2022 1638   RBC 5.04 04/03/2024 0811   RBC 4.95 12/21/2022 1638   HGB 12.1 04/03/2024 0811   HCT 38.4 04/03/2024 0811   PLT 303 04/03/2024 0811   MCV 76 (L) 04/03/2024 0811   MCH 24.0 (L) 04/03/2024 0811   MCH 23.6 (L) 12/21/2022 1638   MCHC 31.5 04/03/2024 0811   MCHC 31.5 (L) 12/21/2022 1638   RDW 16.4 (H) 04/03/2024 0811   LYMPHSABS 3.2 (H) 04/03/2024 0811   MONOABS 0.6 09/10/2018 1355   EOSABS 0.1 04/03/2024 0811   BASOSABS 0.0 04/03/2024 0811    BMET  Component Value Date/Time   NA 146 (H) 04/03/2024 0811   K 4.5 04/03/2024 0811   CL 107 (H) 04/03/2024 0811   CO2 23 04/03/2024 0811   GLUCOSE 105 (H) 04/03/2024 0811   GLUCOSE 94 12/21/2022 1638   BUN 11 04/03/2024 0811   CREATININE 0.76 04/03/2024 0811   CREATININE 0.75 12/21/2022 1638   CALCIUM  9.7 04/03/2024 0811   GFRNONAA >60 09/10/2018 1355   GFRAA >60 09/10/2018 1355    BNP No results found for: BNP  ProBNP No results found for: PROBNP  Specialty Problems       Pulmonary Problems   Asthma   Qualifier: Diagnosis of  By: Waylan ROSALEA Darice KATRINA SNOMED Dx Update Oct 2024       Allergies  Allergen Reactions   Iodinated Contrast Media Nausea And Vomiting and Anaphylaxis   Iodine Nausea And Vomiting    Pt states she is not allergic to iodine. Pt states she is not allergic to  iodine.   Tape     Paper tape-rips skin    Immunization History  Administered Date(s) Administered   Influenza Whole 04/22/2006   Influenza, Seasonal, Injecte, Preservative Fre 04/02/2024   PFIZER(Purple Top)SARS-COV-2 Vaccination 10/31/2019, 11/27/2019, 08/01/2020   Tdap 07/03/2019    Past Medical History:  Diagnosis Date   Alopecia    Arthritis    Asthma    Cyst, ovary, follicular    Diverticulitis    Ectopic pregnancy    H. pylori infection    Hx of hysterectomy partial for cervicl CA 2003   Migraine headache    Migraines    Sigmoidoscopy exam partial sigmoid colectomy 07-23-08    Tobacco History: Social History   Tobacco Use  Smoking Status Never  Smokeless Tobacco Never   Counseling given: Not Answered   Continue to not smoke  Outpatient Encounter Medications as of 05/05/2024  Medication Sig   Albuterol -Budesonide  (AIRSUPRA ) 90-80 MCG/ACT AERO Inhale 2 puffs into the lungs every 6 (six) hours as needed. Rinse mouth after use   amoxicillin  (AMOXIL ) 875 MG tablet Take 1 tablet (875 mg total) by mouth 2 (two) times daily.   budesonide -formoterol  (SYMBICORT ) 160-4.5 MCG/ACT inhaler Inhale 2 puffs into the lungs 2 (two) times daily.   budesonide -glycopyrrolate-formoterol  (BREZTRI AEROSPHERE) 160-9-4.8 MCG/ACT AERO inhaler 2 samples   HYDROcodone  bit-homatropine (HYCODAN) 5-1.5 MG/5ML syrup Take 5 mLs by mouth every 8 (eight) hours as needed for cough.   hydrocortisone  valerate cream (WESTCORT ) 0.2 % Apply 1 Application topically in the morning.   ketoconazole  (NIZORAL ) 2 % shampoo Apply to nose/ face twice a week for 8 weeks and then weekly thereafter. Lather, leave on for 5 min prior to rinsing   metroNIDAZOLE  (METROGEL ) 0.75 % gel 1 application to affected areas of face up to twice daily   montelukast  (SINGULAIR ) 10 MG tablet Take 1 tablet (10 mg total) by mouth at bedtime.   predniSONE  (DELTASONE ) 20 MG tablet Take 2 tablets (40 mg total) by mouth daily with  breakfast for 5 days, THEN 1 tablet (20 mg total) daily with breakfast for 5 days.   valACYclovir  (VALTREX ) 500 MG tablet Take 1 tablet (500 mg total) by mouth 2 (two) times daily as needed. For 3 to 5 days at the first sign of outbreak   [DISCONTINUED] predniSONE  (DELTASONE ) 20 MG tablet Take 2 tablets (40 mg total) by mouth daily with breakfast.   No facility-administered encounter medications on file as of 05/05/2024.     Review of Systems  Review of Systems  Comprehensive review of systems negative unless mentioned in HPI above  Physical Exam  BP 120/84   Pulse 74   Ht 5' 5 (1.651 m)   Wt 278 lb 6.4 oz (126.3 kg)   SpO2 92%   BMI 46.33 kg/m   Wt Readings from Last 5 Encounters:  05/05/24 278 lb 6.4 oz (126.3 kg)  04/13/24 272 lb (123.4 kg)  04/02/24 272 lb (123.4 kg)  03/20/24 272 lb 12 oz (123.7 kg)  12/31/23 270 lb (122.5 kg)    BMI Readings from Last 5 Encounters:  05/05/24 46.33 kg/m  04/13/24 45.26 kg/m  04/02/24 45.26 kg/m  03/20/24 45.39 kg/m  12/31/23 44.93 kg/m     Physical Exam General: Sitting in chair, no acute distress Eyes: EOMI, Neck: Supple, no JVP Pulmonary: Clear, normal work of breathing, poor air excursion, tight Cardiovascular: Regular rate and rhythm, no murmur Abdomen: Nondistended MSK: No synovitis, no joint effusion Neuro: Normal gait, no weakness Psych: Normal mood, full   Assessment & Plan:   Prolonged bronchitis symptoms: Dry cough wheeze shortness of breath now about a month after initial what sounds like viral infection.  No significant improvement despite Symbicort , albuterol .  No significant treatment with initial steroid and antibiotic course.  Chest x-ray is clear.  Repeat steroids, prednisone  10-day taper.  Escalate to Breztri for 2 weeks, assess response.  Sounds like bronchospasm with no mucus, quick triggers.  Hopeful additional bronchodilator will help with bronchospasm and prolonged course of steroids will help  reduce inflammation in both will improve cough.  Hyperinflation on chest x-ray: Mild.  High suspicion for underlying asthma.  History of recurrent bronchitis noted although relatively infrequent.  Eosinophil 300 11/2023.  Recurrent bouts of bronchitis began to increase in frequency then would recommend biologic therapy.   Return in about 4 weeks (around 06/02/2024) for f/u Dr. Annella or APP.   Donnice JONELLE Annella, MD 05/05/2024   This appointment required 60 minutes of patient care (this includes precharting, chart review, review of results, face-to-face care, etc.).

## 2024-05-06 ENCOUNTER — Other Ambulatory Visit

## 2024-05-06 DIAGNOSIS — R059 Cough, unspecified: Secondary | ICD-10-CM | POA: Diagnosis not present

## 2024-05-06 DIAGNOSIS — R0602 Shortness of breath: Secondary | ICD-10-CM

## 2024-05-11 ENCOUNTER — Ambulatory Visit: Payer: Self-pay | Admitting: Family Medicine

## 2024-05-11 NOTE — Progress Notes (Signed)
 HI Meredith Barnes,  Chest CT overall looks good.  They did compare it to an old 1 from 2005 and a chest x-ray from 2025.  They did not see anything concerning which is very reassuring.  No mass or fluid or active infection.

## 2024-05-11 NOTE — Progress Notes (Signed)
 Does the cough syrup help.  I can refill hit.  I would recommend start Prilosec or nexium twice daily as well.  .  Sometimes the severe coughing can trigger reflux and then the reflux specialist onto the vocal cords and causes more coughing.  So definitely be worth a try.

## 2024-06-05 ENCOUNTER — Ambulatory Visit: Admitting: Pulmonary Disease

## 2024-06-13 ENCOUNTER — Other Ambulatory Visit: Payer: Self-pay | Admitting: Urgent Care

## 2024-06-13 DIAGNOSIS — J4521 Mild intermittent asthma with (acute) exacerbation: Secondary | ICD-10-CM

## 2024-07-20 ENCOUNTER — Emergency Department (HOSPITAL_BASED_OUTPATIENT_CLINIC_OR_DEPARTMENT_OTHER)
Admission: EM | Admit: 2024-07-20 | Discharge: 2024-07-21 | Disposition: A | Discharge status: Home / Self Care | Attending: Emergency Medicine | Admitting: Emergency Medicine

## 2024-07-20 ENCOUNTER — Encounter (HOSPITAL_BASED_OUTPATIENT_CLINIC_OR_DEPARTMENT_OTHER): Payer: Self-pay

## 2024-07-20 ENCOUNTER — Ambulatory Visit: Admitting: Family Medicine

## 2024-07-20 ENCOUNTER — Other Ambulatory Visit: Payer: Self-pay

## 2024-07-20 ENCOUNTER — Emergency Department (HOSPITAL_BASED_OUTPATIENT_CLINIC_OR_DEPARTMENT_OTHER)

## 2024-07-20 VITALS — BP 119/84 | HR 79 | Ht 65.0 in | Wt 274.0 lb

## 2024-07-20 DIAGNOSIS — M549 Dorsalgia, unspecified: Secondary | ICD-10-CM | POA: Diagnosis not present

## 2024-07-20 DIAGNOSIS — W1839XA Other fall on same level, initial encounter: Secondary | ICD-10-CM | POA: Diagnosis not present

## 2024-07-20 DIAGNOSIS — R040 Epistaxis: Secondary | ICD-10-CM | POA: Diagnosis not present

## 2024-07-20 DIAGNOSIS — S8002XA Contusion of left knee, initial encounter: Secondary | ICD-10-CM | POA: Insufficient documentation

## 2024-07-20 DIAGNOSIS — Y92531 Health care provider office as the place of occurrence of the external cause: Secondary | ICD-10-CM | POA: Insufficient documentation

## 2024-07-20 DIAGNOSIS — R1013 Epigastric pain: Secondary | ICD-10-CM | POA: Diagnosis not present

## 2024-07-20 DIAGNOSIS — R1085 Abdominal pain of multiple sites: Secondary | ICD-10-CM | POA: Insufficient documentation

## 2024-07-20 DIAGNOSIS — R1030 Lower abdominal pain, unspecified: Secondary | ICD-10-CM | POA: Diagnosis not present

## 2024-07-20 LAB — COMPREHENSIVE METABOLIC PANEL WITH GFR
ALT: 15 U/L (ref 0–44)
AST: 15 U/L (ref 15–41)
Albumin: 4.2 g/dL (ref 3.5–5.0)
Alkaline Phosphatase: 81 U/L (ref 38–126)
Anion gap: 9 (ref 5–15)
BUN: 10 mg/dL (ref 6–20)
CO2: 30 mmol/L (ref 22–32)
Calcium: 9.8 mg/dL (ref 8.9–10.3)
Chloride: 105 mmol/L (ref 98–111)
Creatinine, Ser: 0.77 mg/dL (ref 0.44–1.00)
GFR, Estimated: >60 mL/min
Glucose, Bld: 98 mg/dL (ref 70–99)
Potassium: 3.8 mmol/L (ref 3.5–5.1)
Sodium: 145 mmol/L (ref 135–145)
Total Bilirubin: 0.2 mg/dL (ref 0.0–1.2)
Total Protein: 7.5 g/dL (ref 6.5–8.1)

## 2024-07-20 LAB — CBC WITH DIFFERENTIAL/PLATELET
Abs Immature Granulocytes: 0.02 K/uL (ref 0.00–0.07)
Basophils Absolute: 0 K/uL (ref 0.0–0.1)
Basophils Relative: 0 %
Eosinophils Absolute: 0.2 K/uL (ref 0.0–0.5)
Eosinophils Relative: 2 %
HCT: 39.3 % (ref 36.0–46.0)
Hemoglobin: 12.3 g/dL (ref 12.0–15.0)
Immature Granulocytes: 0 %
Lymphocytes Relative: 35 %
Lymphs Abs: 3.2 K/uL (ref 0.7–4.0)
MCH: 24 pg — ABNORMAL LOW (ref 26.0–34.0)
MCHC: 31.3 g/dL (ref 30.0–36.0)
MCV: 76.8 fL — ABNORMAL LOW (ref 80.0–100.0)
Monocytes Absolute: 0.7 K/uL (ref 0.1–1.0)
Monocytes Relative: 7 %
Neutro Abs: 4.9 K/uL (ref 1.7–7.7)
Neutrophils Relative %: 56 %
Platelets: 299 K/uL (ref 150–400)
RBC: 5.12 MIL/uL — ABNORMAL HIGH (ref 3.87–5.11)
RDW: 17.2 % — ABNORMAL HIGH (ref 11.5–15.5)
WBC: 8.9 K/uL (ref 4.0–10.5)
nRBC: 0 % (ref 0.0–0.2)

## 2024-07-20 LAB — POCT URINALYSIS DIP (CLINITEK)
Bilirubin, UA: NEGATIVE
Blood, UA: NEGATIVE
Glucose, UA: NEGATIVE mg/dL
Ketones, POC UA: NEGATIVE mg/dL
Leukocytes, UA: NEGATIVE
Nitrite, UA: NEGATIVE
POC PROTEIN,UA: NEGATIVE
Spec Grav, UA: 1.02
Urobilinogen, UA: 0.2 U/dL
pH, UA: 5.5

## 2024-07-20 LAB — LIPASE, BLOOD: Lipase: 35 U/L (ref 11–51)

## 2024-07-20 NOTE — Progress Notes (Addendum)
 "  Acute Office Visit  Patient ID: Meredith Barnes, female    DOB: 01-13-1976, 48 y.o.   MRN: [id number]  PCP: Alvan Dorothyann BIRCH, MD  Chief Complaint  Patient presents with   Back Pain   Abdominal Pain    Subjective:     HPI  Discussed the use of AI scribe software for clinical note transcription with the patient, who gave verbal consent to proceed.  History of Present Illness Meredith Barnes is a 48 year old female who presents with abdominal and back pain.  Abdominal and back pain - Severe pain since Thursday, initially thought to be due to gas - Pain primarily located in the abdomen, radiating to the back, mostly on one side - Pain described as intolerable at times - No use of over-the-counter pain medications such as Tylenol  or Ibuprofen  - No prior episodes of similar pain  Gastrointestinal symptoms - Change in bowel movements: initial constipation with straining, followed by looser stools - No blood in bowel movements - Only one episode of nausea - No fever  Urinary symptoms - No urinary symptoms such as burning or urgency  Epistaxis - Recent episode of significant epistaxis from the left nostril - Bleeding described as severe, similar to 'starting a cycle' - No preceding cold or sinus infection symptoms  Gynecologic surgical history - History of hysterectomy and oophorectomy   ROS     Objective:    BP 119/84   Pulse 79   Ht 5' 5 (1.651 m)   Wt 274 lb (124.3 kg)   SpO2 100%   BMI 45.60 kg/m    Physical Exam Vitals and nursing note reviewed.  Constitutional:      Appearance: Normal appearance.  HENT:     Head: Normocephalic and atraumatic.  Eyes:     Conjunctiva/sclera: Conjunctivae normal.  Cardiovascular:     Rate and Rhythm: Normal rate and regular rhythm.  Pulmonary:     Effort: Pulmonary effort is normal.     Breath sounds: Normal breath sounds.  Abdominal:     General: Bowel sounds are normal.     Palpations: Abdomen is  soft.     Tenderness: There is abdominal tenderness in the epigastric area and suprapubic area. There is no guarding or rebound.  Musculoskeletal:     Comments: No CVA tenderness  Skin:    General: Skin is warm and dry.  Neurological:     Mental Status: She is alert.  Psychiatric:        Mood and Affect: Mood normal.       Results for orders placed or performed in visit on 07/20/24  POCT URINALYSIS DIP (CLINITEK)  Result Value Ref Range   Color, UA yellow yellow   Clarity, UA clear clear   Glucose, UA negative negative mg/dL   Bilirubin, UA negative negative   Ketones, POC UA negative negative mg/dL   Spec Grav, UA 8.979 8.989 - 1.025   Blood, UA negative negative   pH, UA 5.5 5.0 - 8.0   POC PROTEIN,UA negative negative, trace   Urobilinogen, UA 0.2 0.2 or 1.0 E.U./dL   Nitrite, UA Negative Negative   Leukocytes, UA Negative Negative       Assessment & Plan:   Problem List Items Addressed This Visit   None Visit Diagnoses       Lower abdominal pain    -  Primary   Relevant Orders   CBC with Differential/Platelet   CMP14+EGFR   Lipase  POCT URINALYSIS DIP (CLINITEK) (Completed)     Epigastric pain       Relevant Orders   CBC with Differential/Platelet   CMP14+EGFR   Lipase     Mid back pain       Relevant Orders   CBC with Differential/Platelet   CMP14+EGFR   Lipase     Left-sided nosebleed       Relevant Orders   Ambulatory referral to ENT       Assessment and Plan Assessment & Plan Abdominal pain, possible diverticulitis Abdominal and back pain with tenderness, constipation followed by loose stools. Differential includes diverticulitis. No fever, nausea, or urinary symptoms. No ovarian cysts due to hysterectomy and oophorectomy. - Ordered urinalysis to rule out urinary causes.UA was normal.   - Ordered CBC to check for infection. - Ordered metabolic panel to assess liver and pancreatic function. - Consider CT scan if symptoms persist or  worsen. - Recommended ibuprofen , Aleve , or Tylenol  for pain relief.  Recurrent epistaxis Recurrent significant bleeding from left nostril, likely due to broken blood vessel and dry air. No recent upper respiratory infection. - Refer to ENT for evaluation and possible cauterization if recurrent epistaxis continues.    No orders of the defined types were placed in this encounter.   No follow-ups on file.  Dorothyann Byars, MD [facility] at [facility]   "

## 2024-07-20 NOTE — Progress Notes (Signed)
 Pt reports low abdominal pain and mid to low back pain that started 4 days ago. Denies any f/s/c/n/v/d. No urinary issues

## 2024-07-20 NOTE — ED Provider Notes (Signed)
 " [place] Provider Note   CSN: [id number] Arrival date & time: 07/20/24  1914     Patient presents with: Abdominal Pain and Back Pain   Meredith Barnes is a 48 y.o. female.  {Add pertinent medical, surgical, social history, OB history to YEP:67052} Patient presents to the emergency department for evaluation of abdominal pain.  Symptoms have been present for 4 days.  Patient reports pain in the lower and upper mid abdomen.  She saw her doctor earlier today, had lab work performed but no results.  She did have a urinalysis that was normal.  Patient reports a prior history of diverticulitis, this feels different.  Pain worsened tonight prompting her to come to the emergency department.       Prior to Admission medications  Medication Sig Start Date End Date Taking? Authorizing Provider  Albuterol -Budesonide  (AIRSUPRA ) 90-80 MCG/ACT AERO Inhale 2 puffs into the lungs every 6 (six) hours as needed. Rinse mouth after use 03/20/24   Crain, Whitney L, PA  budesonide -formoterol  (SYMBICORT ) 160-4.5 MCG/ACT inhaler Inhale 2 puffs into the lungs 2 (two) times daily. 04/13/24   Alvan Dorothyann BIRCH, MD  budesonide -glycopyrrolate-formoterol  (BREZTRI  AEROSPHERE) 160-9-4.8 MCG/ACT AERO inhaler 2 samples 05/05/24   Hunsucker, Donnice SAUNDERS, MD  hydrocortisone  valerate cream (WESTCORT ) 0.2 % Apply 1 Application topically in the morning. 04/02/24   Alvan Dorothyann BIRCH, MD  ketoconazole  (NIZORAL ) 2 % shampoo Apply to nose/ face twice a week for 8 weeks and then weekly thereafter. Lather, leave on for 5 min prior to rinsing 03/20/24   Crain, Belzoni L, PA  metroNIDAZOLE  (METROGEL ) 0.75 % gel 1 application to affected areas of face up to twice daily 03/20/24   Crain, Whitney L, PA  montelukast  (SINGULAIR ) 10 MG tablet Take 1 tablet (10 mg total) by mouth at bedtime. 03/20/24   Crain, Whitney L, PA  valACYclovir  (VALTREX ) 500 MG tablet Take 1 tablet (500 mg total)  by mouth 2 (two) times daily as needed. For 3 to 5 days at the first sign of outbreak 04/02/24   Alvan Dorothyann BIRCH, MD    Allergies: Iodinated contrast media, Iodine, and Tape    Review of Systems  Updated Vital Signs BP (!) 140/102 (BP Location: Right Arm)   Pulse 85   Temp 97.9 F (36.6 C)   Resp 18   Ht 5' 5 (1.651 m)   Wt 124 kg   SpO2 100%   BMI 45.49 kg/m   Physical Exam Vitals and nursing note reviewed.  Constitutional:      General: She is not in acute distress.    Appearance: She is well-developed.  HENT:     Head: Normocephalic and atraumatic.     Mouth/Throat:     Mouth: Mucous membranes are moist.  Eyes:     General: Vision grossly intact. Gaze aligned appropriately.     Extraocular Movements: Extraocular movements intact.     Conjunctiva/sclera: Conjunctivae normal.  Cardiovascular:     Rate and Rhythm: Normal rate and regular rhythm.     Pulses: Normal pulses.     Heart sounds: Normal heart sounds, S1 normal and S2 normal. No murmur heard.    No friction rub. No gallop.  Pulmonary:     Effort: Pulmonary effort is normal. No respiratory distress.     Breath sounds: Normal breath sounds.  Abdominal:     General: Bowel sounds are normal.     Palpations: Abdomen is soft.  Tenderness: There is abdominal tenderness in the epigastric area and suprapubic area. There is no guarding or rebound.     Hernia: No hernia is present.  Musculoskeletal:        General: No swelling.     Cervical back: Full passive range of motion without pain, normal range of motion and neck supple. No spinous process tenderness or muscular tenderness. Normal range of motion.     Right lower leg: No edema.     Left lower leg: No edema.  Skin:    General: Skin is warm and dry.     Capillary Refill: Capillary refill takes less than 2 seconds.     Findings: No ecchymosis, erythema, rash or wound.  Neurological:     General: No focal deficit present.     Mental Status: She is  alert and oriented to person, place, and time.     GCS: GCS eye subscore is 4. GCS verbal subscore is 5. GCS motor subscore is 6.     Cranial Nerves: Cranial nerves 2-12 are intact.     Sensory: Sensation is intact.     Motor: Motor function is intact.     Coordination: Coordination is intact.  Psychiatric:        Attention and Perception: Attention normal.        Mood and Affect: Mood normal.        Speech: Speech normal.        Behavior: Behavior normal.     (all labs ordered are listed, but only abnormal results are displayed) Labs Reviewed  CBC WITH DIFFERENTIAL/PLATELET - Abnormal; Notable for the following components:      Result Value   RBC 5.12 (*)    MCV 76.8 (*)    MCH 24.0 (*)    RDW 17.2 (*)    All other components within normal limits  COMPREHENSIVE METABOLIC PANEL WITH GFR  LIPASE, BLOOD    EKG: None  Radiology: No results found.  {Document cardiac monitor, telemetry assessment procedure when appropriate:32947} Procedures   Medications Ordered in the ED - No data to display    {Click here for ABCD2, HEART and other calculators REFRESH Note before signing:1}                              Medical Decision Making Amount and/or Complexity of Data Reviewed Labs: ordered. Radiology: ordered.   ***  {Document critical care time when appropriate  Document review of labs and clinical decision tools ie CHADS2VASC2, etc  Document your independent review of radiology images and any outside records  Document your discussion with family members, caretakers and with consultants  Document social determinants of health affecting pt's care  Document your decision making why or why not admission, treatments were needed:32947:::1}   Final diagnoses:  None    ED Discharge Orders     None        "

## 2024-07-20 NOTE — ED Triage Notes (Signed)
 Pt c/o abd pain and back pain that started Thursday.  Saw PCP this am and labs were obtained.   Pt fell leaving doctor's office and  c/o rt. Knee pain

## 2024-07-21 ENCOUNTER — Ambulatory Visit: Payer: Self-pay | Admitting: Family Medicine

## 2024-07-21 ENCOUNTER — Emergency Department (HOSPITAL_BASED_OUTPATIENT_CLINIC_OR_DEPARTMENT_OTHER)

## 2024-07-21 DIAGNOSIS — M549 Dorsalgia, unspecified: Secondary | ICD-10-CM

## 2024-07-21 DIAGNOSIS — R1013 Epigastric pain: Secondary | ICD-10-CM

## 2024-07-21 DIAGNOSIS — R1030 Lower abdominal pain, unspecified: Secondary | ICD-10-CM

## 2024-07-21 LAB — CMP14+EGFR
ALT: 17 IU/L (ref 0–32)
AST: 12 IU/L (ref 0–40)
Albumin: 4.3 g/dL (ref 3.9–4.9)
Alkaline Phosphatase: 85 IU/L (ref 41–116)
BUN/Creatinine Ratio: 13 (ref 9–23)
BUN: 11 mg/dL (ref 6–24)
Bilirubin Total: 0.2 mg/dL (ref 0.0–1.2)
CO2: 24 mmol/L (ref 20–29)
Calcium: 10 mg/dL (ref 8.7–10.2)
Chloride: 101 mmol/L (ref 96–106)
Creatinine, Ser: 0.82 mg/dL (ref 0.57–1.00)
Globulin, Total: 3 g/dL (ref 1.5–4.5)
Glucose: 113 mg/dL — ABNORMAL HIGH (ref 70–99)
Potassium: 4.2 mmol/L (ref 3.5–5.2)
Sodium: 142 mmol/L (ref 134–144)
Total Protein: 7.3 g/dL (ref 6.0–8.5)
eGFR: 89 mL/min/1.73

## 2024-07-21 LAB — CBC WITH DIFFERENTIAL/PLATELET
Basophils Absolute: 0 x10E3/uL (ref 0.0–0.2)
Basos: 1 %
EOS (ABSOLUTE): 0.1 x10E3/uL (ref 0.0–0.4)
Eos: 2 %
Hematocrit: 41 % (ref 34.0–46.6)
Hemoglobin: 12.7 g/dL (ref 11.1–15.9)
Immature Grans (Abs): 0 x10E3/uL (ref 0.0–0.1)
Immature Granulocytes: 0 %
Lymphocytes Absolute: 2.6 x10E3/uL (ref 0.7–3.1)
Lymphs: 42 %
MCH: 24.1 pg — ABNORMAL LOW (ref 26.6–33.0)
MCHC: 31 g/dL — ABNORMAL LOW (ref 31.5–35.7)
MCV: 78 fL — ABNORMAL LOW (ref 79–97)
Monocytes Absolute: 0.4 x10E3/uL (ref 0.1–0.9)
Monocytes: 7 %
Neutrophils Absolute: 3 x10E3/uL (ref 1.4–7.0)
Neutrophils: 48 %
Platelets: 318 x10E3/uL (ref 150–450)
RBC: 5.28 x10E6/uL (ref 3.77–5.28)
RDW: 16.4 % — ABNORMAL HIGH (ref 11.7–15.4)
WBC: 6.1 x10E3/uL (ref 3.4–10.8)

## 2024-07-21 LAB — LIPASE: Lipase: 39 U/L (ref 14–72)

## 2024-07-21 LAB — SPECIMEN STATUS REPORT

## 2024-07-21 MED ORDER — HYOSCYAMINE SULFATE 0.125 MG SL SUBL: 0.1250 mg | SUBLINGUAL_TABLET | SUBLINGUAL | 0 refills | Status: AC | PRN

## 2024-07-21 MED ORDER — PANTOPRAZOLE SODIUM 40 MG PO TBEC: 40.0000 mg | DELAYED_RELEASE_TABLET | Freq: Every day | ORAL | 3 refills | Status: AC

## 2024-07-21 NOTE — Progress Notes (Signed)
 Hi finally got your labs back.  The good news is that your white blood cells are not significantly elevated so usually makes any sign of systemic infection less likely which is great.  Hemoglobin is stable.  The size of your red blood cells are little bit smaller so organ to call the lab to see if we can add an iron panel.  Your liver function looks good so that rules out any acute liver inflammation.  I am going to ahead and schedule you for a CT abdomen pelvis.

## 2024-07-21 NOTE — Discharge Instructions (Signed)
 Your lab work and CAT scan did not show any dangerous cause of your pain.  We will start you on Protonix that should help if there is inflammation or ulcer in your stomach.  Levsin will help if this is intestinal cramping.  Please schedule follow-up with your gastroenterologist for recheck.  If knee is not improving in the next couple of days, follow-up with your orthopedic doctor.

## 2024-07-21 NOTE — ED Notes (Signed)

## 2024-07-21 NOTE — Telephone Encounter (Signed)
 I do also recommend taking 1 capful of MiraLAX mixed with 68 ounces of water at bedtime until she has soft mushy stools and definitely start the pantoprazole.  I am happy to place a GI referral.  Would she like to go back to [facility]?  Do we need to get her in with an Ortho for the knee?

## 2024-07-22 LAB — HM MAMMOGRAPHY

## 2024-07-30 ENCOUNTER — Ambulatory Visit: Payer: Self-pay | Admitting: Family Medicine

## 2024-07-30 ENCOUNTER — Encounter: Payer: Self-pay | Admitting: Family Medicine

## 2024-07-30 NOTE — Progress Notes (Signed)
 Please call patient. Normal mammogram.  Repeat in 1 year.

## 2024-08-17 ENCOUNTER — Other Ambulatory Visit: Payer: Self-pay | Admitting: Family Medicine
